# Patient Record
Sex: Male | Born: 1960 | Race: Black or African American | Hispanic: No | Marital: Single | Smoking: Current every day smoker
Health system: Southern US, Community
[De-identification: ages and names within clinical notes are randomized; demographics above are authoritative.]

## PROBLEM LIST (undated history)

## (undated) DIAGNOSIS — I5022 Chronic systolic (congestive) heart failure: Secondary | ICD-10-CM

## (undated) DIAGNOSIS — I10 Essential (primary) hypertension: Secondary | ICD-10-CM

## (undated) DIAGNOSIS — I319 Disease of pericardium, unspecified: Secondary | ICD-10-CM

## (undated) DIAGNOSIS — J189 Pneumonia, unspecified organism: Secondary | ICD-10-CM

## (undated) DIAGNOSIS — I2510 Atherosclerotic heart disease of native coronary artery without angina pectoris: Secondary | ICD-10-CM

## (undated) DIAGNOSIS — J449 Chronic obstructive pulmonary disease, unspecified: Secondary | ICD-10-CM

## (undated) DIAGNOSIS — E0590 Thyrotoxicosis, unspecified without thyrotoxic crisis or storm: Secondary | ICD-10-CM

## (undated) DIAGNOSIS — Z8639 Personal history of other endocrine, nutritional and metabolic disease: Secondary | ICD-10-CM

## (undated) DIAGNOSIS — F1011 Alcohol abuse, in remission: Secondary | ICD-10-CM

## (undated) DIAGNOSIS — Z720 Tobacco use: Secondary | ICD-10-CM

## (undated) DIAGNOSIS — K219 Gastro-esophageal reflux disease without esophagitis: Secondary | ICD-10-CM

## (undated) HISTORY — DX: Disease of pericardium, unspecified: I31.9

## (undated) HISTORY — DX: Pneumonia, unspecified organism: J18.9

## (undated) HISTORY — PX: TONSILLECTOMY: SUR1361

## (undated) HISTORY — DX: Atherosclerotic heart disease of native coronary artery without angina pectoris: I25.10

## (undated) HISTORY — DX: Thyrotoxicosis, unspecified without thyrotoxic crisis or storm: E05.90

---
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
## 2009-10-29 ENCOUNTER — Observation Stay (HOSPITAL_COMMUNITY): Admission: EM | Admit: 2009-10-29 | Discharge: 2009-10-29 | Payer: Self-pay | Admitting: Emergency Medicine

## 2009-11-03 ENCOUNTER — Inpatient Hospital Stay (HOSPITAL_COMMUNITY): Admission: EM | Admit: 2009-11-03 | Discharge: 2009-11-05 | Payer: Self-pay | Admitting: Emergency Medicine

## 2009-11-15 ENCOUNTER — Ambulatory Visit: Payer: Self-pay | Admitting: Physician Assistant

## 2009-11-15 DIAGNOSIS — F1011 Alcohol abuse, in remission: Secondary | ICD-10-CM | POA: Insufficient documentation

## 2009-11-15 DIAGNOSIS — J449 Chronic obstructive pulmonary disease, unspecified: Secondary | ICD-10-CM

## 2009-11-15 DIAGNOSIS — J4489 Other specified chronic obstructive pulmonary disease: Secondary | ICD-10-CM | POA: Insufficient documentation

## 2009-11-15 DIAGNOSIS — I10 Essential (primary) hypertension: Secondary | ICD-10-CM | POA: Insufficient documentation

## 2009-11-15 DIAGNOSIS — R82998 Other abnormal findings in urine: Secondary | ICD-10-CM

## 2009-11-15 LAB — CONVERTED CEMR LAB
AST: 15 units/L (ref 0–37)
Alkaline Phosphatase: 87 units/L (ref 39–117)
Amphetamine Screen, Ur: NEGATIVE
BUN: 18 mg/dL (ref 6–23)
Barbiturate Quant, Ur: NEGATIVE
Basophils Absolute: 0.1 10*3/uL (ref 0.0–0.1)
Basophils Relative: 1 % (ref 0–1)
Benzodiazepines.: NEGATIVE
Bilirubin Urine: NEGATIVE
Blood in Urine, dipstick: NEGATIVE
Cocaine Metabolites: NEGATIVE
Creatinine, Ser: 0.98 mg/dL (ref 0.40–1.50)
Creatinine,U: 224.6 mg/dL
Eosinophils Absolute: 0.3 10*3/uL (ref 0.0–0.7)
Eosinophils Relative: 3 % (ref 0–5)
Glucose, Bld: 87 mg/dL (ref 70–99)
HCT: 40.9 % (ref 39.0–52.0)
Hemoglobin: 13.3 g/dL (ref 13.0–17.0)
Ketones, urine, test strip: NEGATIVE
MCHC: 32.5 g/dL (ref 30.0–36.0)
Methadone: NEGATIVE
Neutro Abs: 4.8 10*3/uL (ref 1.7–7.7)
Nitrite: NEGATIVE
Opiate Screen, Urine: NEGATIVE
Platelets: 190 10*3/uL (ref 150–400)
RDW: 15.5 % (ref 11.5–15.5)
Urobilinogen, UA: 0.2
pH: 5.5

## 2009-11-18 ENCOUNTER — Encounter: Payer: Self-pay | Admitting: Physician Assistant

## 2009-11-18 ENCOUNTER — Inpatient Hospital Stay (HOSPITAL_COMMUNITY): Admission: EM | Admit: 2009-11-18 | Discharge: 2009-11-21 | Payer: Self-pay | Admitting: Emergency Medicine

## 2009-11-19 ENCOUNTER — Ambulatory Visit: Payer: Self-pay | Admitting: Pulmonary Disease

## 2009-11-26 ENCOUNTER — Telehealth (INDEPENDENT_AMBULATORY_CARE_PROVIDER_SITE_OTHER): Payer: Self-pay | Admitting: *Deleted

## 2009-12-04 ENCOUNTER — Ambulatory Visit: Payer: Self-pay | Admitting: Pulmonary Disease

## 2009-12-04 DIAGNOSIS — R930 Abnormal findings on diagnostic imaging of skull and head, not elsewhere classified: Secondary | ICD-10-CM | POA: Insufficient documentation

## 2009-12-17 ENCOUNTER — Encounter: Payer: Self-pay | Admitting: Physician Assistant

## 2009-12-23 ENCOUNTER — Encounter: Payer: Self-pay | Admitting: Physician Assistant

## 2010-11-18 NOTE — Assessment & Plan Note (Signed)
Summary: hfu/ mbw   Primary Provider/Referring Provider:  [facility]  CC:  HFU for pneumonia. The patient c/o increased sob with any exertion and cough with greenish mucus x3-5 days.  The patient has not had any ASA or Spiriva since he got out of the hospital due to cost..  History of Present Illness: 50 yo male hospital follow up for COPD/asthma and abnormal chest xray.  He was doing well after hospital discharge until about 3 days ago.  He says everyone at his home is sick, and he thinks he got a cold.  He has been getting more short of breath.  He has more cough with green sputum.  He is not coughing blood.  He denies fever, wheezing.  His sinuses are okay, and he is not having abd. symptoms.  He has not been on spiriva since hospital discharge as he could not afford this.  He felt much better when on spiriva.  He is not smoking cigarettes.  Preventive Screening-Counseling & Management  Alcohol-Tobacco     Smoking Status: current     Pack years: 38 years x1 ppd  Current Medications (verified): 1)  Norvasc 10 Mg Tabs (Amlodipine Besylate) .... Take 1 Tablet By Mouth Once A Day For Blood Pressure 2)  Accuneb 0.63 Mg/72ml Nebu (Albuterol Sulfate) .... As Directed With Nebulizer 3)  Proventil Hfa 108 (90 Base) Mcg/act Aers (Albuterol Sulfate) .Marland Kitchen.. 1-2 Puffs Every 4-6 Hours As Needed For Shortness of Breath or Wheezing 4)  Spiriva Handihaler 18 Mcg Caps (Tiotropium Bromide Monohydrate) .Marland Kitchen.. 1 Puff Daily 5)  Bayer Childrens Aspirin 81 Mg Chew (Aspirin) .... Take 1 Tablet By Mouth Once A Day  Allergies (verified): No Known Drug Allergies  Past History:  Past Medical History: Hypertension COPD/asthma Myocardial infarction, hx of   a.  per patient in 2005 in [place]   b.  no cath done; may have had a stress test ETOH  Social History: Smoking Status:  current Pack years:  38 years x1 ppd  Vital Signs:  Patient profile:   50 year old male Height:      73 inches (185.42  cm) Weight:      263 pounds (119.55 kg) BMI:     34.82 O2 Sat:      98 % on Room air Temp:     97.9 degrees F (36.61 degrees C) oral Pulse rate:   108 / minute BP sitting:   160 / 92  (left arm) Cuff size:   large  Vitals Entered By: Michel Bickers CMA (December 04, 2009 3:00 PM)  O2 Sat at Rest %:  98 O2 Flow:  Room air CC: HFU for pneumonia. The patient c/o increased sob with any exertion and cough with greenish mucus x3-5 days.  The patient has not had any ASA or Spiriva since he got out of the hospital due to cost.   Physical Exam  General:  normal appearance and healthy appearing.   Nose:  no deformity, discharge, inflammation, or lesions Mouth:  no deformity or lesions Neck:  no JVD.   Lungs:  diminished breath sounds, no wheeze or rales Heart:  regular rhythm, normal rate, and no murmurs.   Extremities:  no edema Cervical Nodes:  no significant adenopathy   Impression & Recommendations:  Problem # 1:  COPD (ICD-496) He has mild exacerbation with a bronchitis.  I don't think he needs prednisone.  Will give him a course of doxycycline.  Have given him samples of spiriva.  Problem # 2:  ABNORMAL CHEST XRAY (ICD-793.1) He had pneumonia in Jan. 2011, and had persistent radiographic findings.  Will repeat his chest xray at next follow up.  Medications Added to Medication List This Visit: 1)  Doxycycline Hyclate 100 Mg Caps (Doxycycline hyclate) .... One by mouth two times a day  Complete Medication List: 1)  Norvasc 10 Mg Tabs (Amlodipine besylate) .... Take 1 tablet by mouth once a day for blood pressure 2)  Accuneb 0.63 Mg/3ml Nebu (Albuterol sulfate) .... As directed with nebulizer 3)  Proventil Hfa 108 (90 Base) Mcg/act Aers (Albuterol sulfate) .Marland Kitchen.. 1-2 puffs every 4-6 hours as needed for shortness of breath or wheezing 4)  Spiriva Handihaler 18 Mcg Caps (Tiotropium bromide monohydrate) .Marland Kitchen.. 1 puff daily 5)  Bayer Childrens Aspirin 81 Mg Chew (Aspirin) .... Take 1 tablet  by mouth once a day 6)  Doxycycline Hyclate 100 Mg Caps (Doxycycline hyclate) .... One by mouth two times a day  Other Orders: Est. Patient Level I (81191)  Patient Instructions: 1)  Spiriva one puff once daily  2)  Doxycycline (antibiotic) one pill two times a day for 7 days 3)  Follow up in 6 to 8 weeks with chest xray Prescriptions: DOXYCYCLINE HYCLATE 100 MG CAPS (DOXYCYCLINE HYCLATE) one by mouth two times a day  #14 x 0   Entered and Authorized by:   Coralyn Helling MD   Signed by:   Coralyn Helling MD on 12/04/2009   Method used:   Print then Give to Patient   RxID:   [id number]

## 2010-11-18 NOTE — Letter (Signed)
Summary: *HSN Results Follow up  [facility]  [address] [place], [place] [address]   Phone: [phone]  Fax: [phone]      11/18/2009   Alejandro Reyes [address] [place], [place]  [address]   Dear  [name],                            ____S.Drinkard,FNP   ____D. Gore,FNP       ____B. McPherson,MD   ____V. Rankins,MD    ____E. Mulberry,MD    ____N. Daphine Deutscher, FNP  ____D. Reche Dixon, MD    ____K. Philipp Deputy, MD    __x__S.Alben Spittle, PA-C     This letter is to inform you that your recent test(s):  _______Pap Smear    ___x____Lab Test     _______X-ray    ___x____ is within acceptable limits  _______ requires a medication change  _______ requires a follow-up lab visit  _______ requires a follow-up visit with your provider   Comments:       _________________________________________________________ If you have any questions, please contact our office                     Sincerely,  Alejandro Newcomer PA-C [facility]
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
## 2010-11-18 NOTE — Letter (Signed)
Summary: REQUESTING RECORDS FROM [facility]   REQUESTING RECORDS FROM [facility]   Imported By: Arta Bruce 12/27/2009 11:22:45  _____________________________________________________________________  External Attachment:    Type:   Image     Comment:   External Document

## 2010-11-18 NOTE — Letter (Signed)
Summary: PT INFORMATION SHEET  PT INFORMATION SHEET   Imported By: Arta Bruce 12/09/2009 12:01:23  _____________________________________________________________________  External Attachment:    Type:   Image     Comment:   External Document

## 2010-11-18 NOTE — Letter (Signed)
Summary: RECEIVED RECORDS FROM [facility]  RECEIV[facility] RECORDS FROM [facility]   Imported By: Arta Bruce 02/14/2010 15:54:09  _____________________________________________________________________  External Attachment:    Type:   Image     Comment:   External Document

## 2010-11-18 NOTE — Progress Notes (Signed)
Summary: appt  ---- Converted from flag ---- ---- 11/21/2009 8:21 AM, Michel Bickers CMA wrote: Marylene Land,  Can you help with setting up a HFU with Dr. Craige Cotta for this patient? Thanks! Lori  ---- 11/20/2009 10:52 AM, Coralyn Helling MD wrote: Need to schedule Mr. Hogston for hospital follow up with chest xray in 2 weeks.  He is still in hospital today, but will probably be d/c home tomorrow.  Can call to schedule appt later this week.  Thanks. ------------------------------  Phone Note Call from Patient   Caller: Patient Call For: sood Summary of Call: lmom for pt to call for appt. Initial call taken by: Eugene Gavia,  November 26, 2009 9:55 AM  Follow-up for Phone Call        pt coming in on 12/04/2009/apc Follow-up by: Eugene Gavia,  November 29, 2009 10:54 AM

## 2010-11-18 NOTE — Assessment & Plan Note (Signed)
Summary: XFU,HTN,ASTHMA//DS   Vital Signs:  Patient profile:   50 year old male Height:      70 inches Weight:      253 pounds BMI:     36.43 O2 Sat:      95 % on Room air Temp:     97.0 degrees F oral Pulse rate:   85 / minute Pulse rhythm:   regular Resp:     18 per minute BP sitting:   160 / 88  (left arm) Cuff size:   large  Vitals Entered By: [place] Shannon (November 15, 2009 10:49 AM)  O2 Flow:  Room air  Serial Vital Signs/Assessments:  Time      Position  BP       Pulse  Resp  Temp     By 12:17 PM            140/86                         Tereso Newcomer PA-C  CC: xf/uy...., Hypertension Management Is Patient Diabetic? No Pain Assessment Patient in pain? no       Does patient need assistance? Functional Status Self care Ambulation Normal Comments PF.    1.   230     2.   270    3.   290         CC:  xf/uy.... and Hypertension Management.  History of Present Illness: New patient. Just d/c after admxn for COPD exac. + pneumonia.  He was d/c 11/05/2009.  He has completed his course of antibx's.  He feels much better.  No fevers.  Cough is almost resolved.  Not bringing up any sputum.   Says he had an MI in 2005.  Details unclear.  No records.  Says he refused a cath and may have had a stress test.  Not taking ASA. Dx with COPD in past and using inhalers.  Not sure if he had PFTs. Taking all meds.  Has finished antibxs and prednisone.  Hypertension History:      He complains of dyspnea with exertion, but denies headache, chest pain, and syncope.  He notes no problems with any antihypertensive medication side effects.        Positive major cardiovascular risk factors include male age 53 years old or older and hypertension.        Positive history for target organ damage include ASHD (either angina/prior MI/prior CABG).     Allergies (verified): No Known Drug Allergies  Comments:  Nurse/Medical Assistant: pt did not bring meds... Armenia Shannon (November 15, 2009 10:52 AM)  Past History:  Past Medical History: Hypertension COPD Myocardial infarction, hx of   a.  per patient in 2005 [place]   b.  no cath done; may have had a stress test  Past Surgical History: Tonsillectomy  Family History: Oral cancer - Father Family History Hypertension  Social History: Occupation: With [employer] alcoholic (sober x 7 mos) h/o THC use denies cocaine use, etc. Single 3 kids  Occupation:  employed  Review of Systems  The patient denies fever, chest pain, syncope, prolonged cough, headaches, hemoptysis, melena, hematochezia, and hematuria.    Physical Exam  General:  alert, well-developed, and well-nourished.   Head:  normocephalic and atraumatic.   Eyes:  pupils equal, pupils round, and pupils reactive to light.   Nose:  no external deformity.   Mouth:  pharynx pink and moist,  no erythema, and no exudates.   Neck:  supple.   Lungs:  normal breath sounds, no crackles, and no wheezes.   Heart:  normal rate, regular rhythm, and no murmur.   Neurologic:  alert & oriented X3 and cranial nerves II-XII intact.   Skin:  turgor normal.   Psych:  normally interactive and good eye contact.     Impression & Recommendations:  Problem # 1:  COPD (ICD-496) improved from exacerbation add spiriva  His updated medication list for this problem includes:    Accuneb 0.63 Mg/52ml Nebu (Albuterol sulfate) .Marland Kitchen... As directed with nebulizer    Proventil Hfa 108 (90 Base) Mcg/act Aers (Albuterol sulfate) .Marland Kitchen... 1-2 puffs every 4-6 hours as needed for shortness of breath or wheezing    Spiriva Handihaler 18 Mcg Caps (Tiotropium bromide monohydrate) .Marland Kitchen... 1 puff daily  Problem # 2:  MYOCARDIAL INFARCTION, HX OF (ICD-412) take ASA once daily get records  His updated medication list for this problem includes:    Norvasc 10 Mg Tabs (Amlodipine besylate) .Marland Kitchen... Take 1 tablet by mouth once a day for blood pressure    Bayer Childrens Aspirin 81 Mg  Chew (Aspirin) .Marland Kitchen... Take 1 tablet by mouth once a day  Problem # 3:  HYPERTENSION (ICD-401.9) repeat bp better just started norvasc cont same meds for now  His updated medication list for this problem includes:    Norvasc 10 Mg Tabs (Amlodipine besylate) .Marland Kitchen... 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 Take 1 tablet by mouth once a day for blood pressure 2)  Accuneb 0.63 Mg/41ml Nebu (Albuterol sulfate) .... As directed with nebulizer 3)  Proventil Hfa 108 (90 Base) Mcg/act Aers (Albuterol sulfate) .Marland Kitchen.. 1-2 puffs every 4-6 hours as needed for shortness of breath or wheezing 4)  Spiriva Handihaler 18 Mcg Caps (Tiotropium bromide monohydrate) .Marland Kitchen.. 1 puff daily 5)  Bayer Childrens Aspirin 81 Mg Chew (Aspirin) .... Take 1 tablet by mouth once a day  Hypertension Assessment/Plan:      The patient's hypertensive risk group is category C: Target organ damage and/or diabetes.  Today's blood pressure is 160/88.    Patient Instructions: 1)  Get old records from 2005 in [place], [place] regarding admission for heart attack/chest pain. 2)  Please schedule a follow-up appointment in 6 weeks with Xylina Rhoads for BP and COPD.  Prescriptions: BAYER CHILDRENS ASPIRIN 81 MG CHEW (ASPIRIN) Take 1 tablet by mouth once a day  #30 x 11   Entered and Authorized by:    Tereso Newcomer PA-C   Signed by:   Tereso Newcomer PA-C on 11/15/2009   Method used:   Print then Give to Patient   RxID:   [id number] SPIRIVA HANDIHALER 18 MCG CAPS (TIOTROPIUM BROMIDE MONOHYDRATE) 1 puff daily  #1 x 5   Entered and Authorized by:   Tereso Newcomer PA-C   Signed by:   Tereso Newcomer PA-C on 11/15/2009   Method used:   Print  then Give to Patient   RxID:   [id number] PROVENTIL HFA 108 (90 BASE) MCG/ACT AERS (ALBUTEROL SULFATE) 1-2 puffs every 4-6 hours as needed for shortness of breath or wheezing  #1 x 5   Entered and Authorized by:   Tereso Newcomer PA-C   Signed by:   Tereso Newcomer PA-C on 11/15/2009   Method used:   Print then Give to Patient   RxID:   [id number] NORVASC 10 MG TABS (AMLODIPINE BESYLATE) Take 1 tablet by mouth once a day for blood pressure  #30 x 5   Entered and Authorized by:   Tereso Newcomer PA-C   Signed by:   Tereso Newcomer PA-C on 11/15/2009   Method used:   Print then Give to Patient   RxID:   [id number]   Laboratory Results   Urine Tests    Routine Urinalysis   Glucose: negative   (Normal Range: Negative) Bilirubin: negative   (Normal Range: Negative) Ketone: negative   (Normal Range: Negative) Spec. Gravity: >=1.030   (Normal Range: 1.003-1.035) Blood: negative   (Normal Range: Negative) pH: 5.5   (Normal Range: 5.0-8.0) Protein: trace   (Normal Range: Negative) Urobilinogen: 0.2   (Normal Range: 0-1) Nitrite: negative   (Normal Range: Negative) Leukocyte Esterace: negative   (Normal Range: Negative)

## 2011-01-04 LAB — BASIC METABOLIC PANEL
Calcium: 9 mg/dL (ref 8.4–10.5)
Creatinine, Ser: 0.92 mg/dL (ref 0.4–1.5)
GFR calc Af Amer: >60 mL/min (ref >60)
Sodium: 136 mEq/L (ref 135–145)

## 2011-01-04 LAB — DIFFERENTIAL
Eosinophils Absolute: 0.3 10*3/uL (ref 0.0–0.7)
Eosinophils Relative: 5 % (ref 0–5)
Lymphocytes Relative: 32 % (ref 12–46)
Lymphs Abs: 2 10*3/uL (ref 0.7–4.0)
Monocytes Relative: 12 % (ref 3–12)
Neutrophils Relative %: 51 % (ref 43–77)

## 2011-01-04 LAB — CBC
Hemoglobin: 12.6 g/dL — ABNORMAL LOW (ref 13.0–17.0)
MCHC: 34.3 g/dL (ref 30.0–36.0)
MCV: 83.9 fL (ref 78.0–100.0)
RBC: 4.38 MIL/uL (ref 4.22–5.81)

## 2011-01-05 LAB — EXPECTORATED SPUTUM ASSESSMENT W GRAM STAIN, RFLX TO RESP C

## 2011-01-05 LAB — DIFFERENTIAL
Basophils Absolute: 0.1 10*3/uL (ref 0.0–0.1)
Eosinophils Relative: 8 % — ABNORMAL HIGH (ref 0–5)
Lymphocytes Relative: 34 % (ref 12–46)
Monocytes Absolute: 0.6 10*3/uL (ref 0.1–1.0)
Neutrophils Relative %: 50 % (ref 43–77)

## 2011-01-05 LAB — POCT I-STAT, CHEM 8
BUN: 10 mg/dL (ref 6–23)
BUN: 13 mg/dL (ref 6–23)
Calcium, Ion: 1.21 mmol/L (ref 1.12–1.32)
Chloride: 106 mEq/L (ref 96–112)
Creatinine, Ser: 0.9 mg/dL (ref 0.4–1.5)
Creatinine, Ser: 1.2 mg/dL (ref 0.4–1.5)
Glucose, Bld: 116 mg/dL — ABNORMAL HIGH (ref 70–99)
Glucose, Bld: 99 mg/dL (ref 70–99)
Hemoglobin: 13.3 g/dL (ref 13.0–17.0)
Potassium: 3.5 mEq/L (ref 3.5–5.1)
Sodium: 142 mEq/L (ref 135–145)

## 2011-01-05 LAB — CULTURE, BLOOD (ROUTINE X 2)

## 2011-01-05 LAB — RAPID URINE DRUG SCREEN, HOSP PERFORMED
Amphetamines: NOT DETECTED
Barbiturates: NOT DETECTED
Opiates: NOT DETECTED
Tetrahydrocannabinol: NOT DETECTED

## 2011-01-05 LAB — CBC
Platelets: 192 10*3/uL (ref 150–400)
RDW: 14.6 % (ref 11.5–15.5)
WBC: 8.7 10*3/uL (ref 4.0–10.5)

## 2011-01-08 LAB — DIFFERENTIAL
Eosinophils Absolute: 0 10*3/uL (ref 0.0–0.7)
Lymphocytes Relative: 8 % — ABNORMAL LOW (ref 12–46)
Lymphs Abs: 1 10*3/uL (ref 0.7–4.0)
Monocytes Relative: 2 % — ABNORMAL LOW (ref 3–12)
Neutrophils Relative %: 90 % — ABNORMAL HIGH (ref 43–77)

## 2011-01-08 LAB — CBC
HCT: 37 % — ABNORMAL LOW (ref 39.0–52.0)
Hemoglobin: 12.3 g/dL — ABNORMAL LOW (ref 13.0–17.0)
MCHC: 32.9 g/dL (ref 30.0–36.0)
MCHC: 33.1 g/dL (ref 30.0–36.0)
MCV: 85.7 fL (ref 78.0–100.0)
RBC: 4.32 MIL/uL (ref 4.22–5.81)
RDW: 14.5 % (ref 11.5–15.5)

## 2011-01-08 LAB — BASIC METABOLIC PANEL
BUN: 9 mg/dL (ref 6–23)
Creatinine, Ser: 0.9 mg/dL (ref 0.4–1.5)
GFR calc Af Amer: >60 mL/min (ref >60)
Glucose, Bld: 137 mg/dL — ABNORMAL HIGH (ref 70–99)
Potassium: 4.3 mEq/L (ref 3.5–5.1)

## 2011-01-08 LAB — COMPREHENSIVE METABOLIC PANEL
ALT: 30 U/L (ref 0–53)
AST: 21 U/L (ref 0–37)
Alkaline Phosphatase: 82 U/L (ref 39–117)
BUN: 6 mg/dL (ref 6–23)
Calcium: 8.9 mg/dL (ref 8.4–10.5)
Chloride: 107 mEq/L (ref 96–112)
GFR calc Af Amer: >60 mL/min (ref >60)
GFR calc non Af Amer: >60 mL/min (ref >60)
Glucose, Bld: 165 mg/dL — ABNORMAL HIGH (ref 70–99)
Potassium: 4.1 mEq/L (ref 3.5–5.1)
Sodium: 138 mEq/L (ref 135–145)
Total Bilirubin: 0.5 mg/dL (ref 0.3–1.2)

## 2012-08-04 ENCOUNTER — Other Ambulatory Visit: Payer: Self-pay

## 2012-08-04 ENCOUNTER — Emergency Department (HOSPITAL_COMMUNITY)
Admission: EM | Admit: 2012-08-04 | Discharge: 2012-08-04 | Disposition: A | Discharge status: Home / Self Care | Payer: 59 | Attending: Emergency Medicine | Admitting: Emergency Medicine

## 2012-08-04 ENCOUNTER — Encounter (HOSPITAL_COMMUNITY): Payer: Self-pay | Admitting: Family Medicine

## 2012-08-04 ENCOUNTER — Emergency Department (HOSPITAL_COMMUNITY): Payer: 59

## 2012-08-04 DIAGNOSIS — R079 Chest pain, unspecified: Secondary | ICD-10-CM | POA: Insufficient documentation

## 2012-08-04 DIAGNOSIS — R0602 Shortness of breath: Secondary | ICD-10-CM | POA: Insufficient documentation

## 2012-08-04 LAB — COMPREHENSIVE METABOLIC PANEL
ALT: 49 U/L (ref 0–53)
BUN: 12 mg/dL (ref 6–23)
CO2: 26 mEq/L (ref 19–32)
Calcium: 9.6 mg/dL (ref 8.4–10.5)
Creatinine, Ser: 0.82 mg/dL (ref 0.50–1.35)
GFR calc Af Amer: >90 mL/min (ref >90)
GFR calc non Af Amer: >90 mL/min (ref >90)
Glucose, Bld: 109 mg/dL — ABNORMAL HIGH (ref 70–99)
Total Protein: 7 g/dL (ref 6.0–8.3)

## 2012-08-04 LAB — CBC WITH DIFFERENTIAL/PLATELET
Eosinophils Absolute: 0.1 10*3/uL (ref 0.0–0.7)
Eosinophils Relative: 2 % (ref 0–5)
HCT: 39.8 % (ref 39.0–52.0)
Hemoglobin: 13.7 g/dL (ref 13.0–17.0)
Lymphocytes Relative: 48 % — ABNORMAL HIGH (ref 12–46)
Lymphs Abs: 2.5 10*3/uL (ref 0.7–4.0)
MCH: 29.4 pg (ref 26.0–34.0)
MCV: 85.4 fL (ref 78.0–100.0)
Monocytes Absolute: 0.8 10*3/uL (ref 0.1–1.0)
Monocytes Relative: 16 % — ABNORMAL HIGH (ref 3–12)
Platelets: 159 10*3/uL (ref 150–400)
RBC: 4.66 MIL/uL (ref 4.22–5.81)
WBC: 5.1 10*3/uL (ref 4.0–10.5)

## 2012-08-04 LAB — POCT I-STAT TROPONIN I

## 2012-08-04 LAB — TROPONIN I: Troponin I: <0.3 ng/mL (ref <0.30)

## 2012-08-04 MED ORDER — OXYCODONE-ACETAMINOPHEN 5-325 MG PO TABS: 1.0000 | ORAL_TABLET | Freq: Four times a day (QID) | ORAL | Status: DC | PRN

## 2012-08-04 MED ORDER — NITROGLYCERIN 0.4 MG SL SUBL: 0.4000 mg | SUBLINGUAL_TABLET | Freq: Once | SUBLINGUAL | Status: DC

## 2012-08-04 MED ORDER — MORPHINE SULFATE 4 MG/ML IJ SOLN
4.0000 mg | Freq: Once | INTRAMUSCULAR | Status: AC
  Administered 2012-08-04: 4 mg via INTRAVENOUS
  Filled 2012-08-04: qty 1

## 2012-08-04 NOTE — ED Provider Notes (Signed)
History     CSN: [id number]  Arrival date & time 08/04/12  1639   First MD Initiated Contact with Patient 08/04/12 1656      Chief Complaint  Patient presents with  . Chest Pain    (Consider location/radiation/quality/duration/timing/severity/associated sxs/prior treatment) Patient is a 51 y.o. male presenting with chest pain. The history is provided by the patient.  Chest Pain The chest pain began 3 - 5 days ago. Primary symptoms include shortness of breath. Pertinent negatives for primary symptoms include no abdominal pain, no nausea and no vomiting.  Pertinent negatives for associated symptoms include no numbness and no weakness.    patient's had a chest pressure for the last 3 days. He has had some shortness of breath with it. No cough. No fever. Pain is worse when he lies down her legs on his left side. No trauma. No recent travel. The pain is described as pressure has been constant for the last 3 days.  History reviewed. No pertinent past medical history.  History reviewed. No pertinent past surgical history.  History reviewed. No pertinent family history.  History  Substance Use Topics  . Smoking status: Current Every Day Smoker  . Smokeless tobacco: Not on file  . Alcohol Use:       Review of Systems  Constitutional: Negative for activity change and appetite change.  HENT: Negative for neck stiffness.   Eyes: Negative for pain.  Respiratory: Positive for shortness of breath. Negative for chest tightness.   Cardiovascular: Positive for chest pain. Negative for leg swelling.  Gastrointestinal: Negative for nausea, vomiting, abdominal pain and diarrhea.  Genitourinary: Negative for flank pain.  Musculoskeletal: Negative for back pain.  Skin: Negative for rash.  Neurological: Negative for weakness, numbness and headaches.  Psychiatric/Behavioral: Negative for behavioral problems.    Allergies  Review of patient's allergies indicates no known allergies.  Home  Medications   Current Outpatient Rx  Name Route Sig Dispense Refill  . OXYCODONE-ACETAMINOPHEN 5-325 MG PO TABS Oral Take 1-2 tablets by mouth every 6 (six) hours as needed for pain. 10 tablet 0    BP 170/70  Pulse 103  Temp 98.5 F (36.9 C) (Oral)  Resp 17  SpO2 97%  Physical Exam  Nursing note and vitals reviewed. Constitutional: He is oriented to person, place, and time. He appears well-developed and well-nourished.  HENT:  Head: Normocephalic and atraumatic.  Eyes: EOM are normal. Pupils are equal, round, and reactive to light.  Neck: Normal range of motion. Neck supple.  Cardiovascular: Normal rate, regular rhythm and normal heart sounds.   No murmur heard. Pulmonary/Chest: Effort normal and breath sounds normal. He exhibits tenderness.       Tenderness over sternum. No crepitance or deformity.  Abdominal: Soft. Bowel sounds are normal. He exhibits no distension and no mass. There is no tenderness. There is no rebound and no guarding.  Musculoskeletal: Normal range of motion. He exhibits no edema.  Neurological: He is alert and oriented to person, place, and time. No cranial nerve deficit.  Skin: Skin is warm and dry.  Psychiatric: He has a normal mood and affect.    ED Course  Procedures (including critical care time)  Labs Reviewed  CBC WITH DIFFERENTIAL - Abnormal; Notable for the following:    Neutrophils Relative 34 (*)     Lymphocytes Relative 48 (*)     Monocytes Relative 16 (*)     All other components within normal limits  COMPREHENSIVE METABOLIC PANEL - Abnormal; Notable  for the following:    Glucose, Bld 109 (*)     Albumin 3.4 (*)     Alkaline Phosphatase 122 (*)     All other components within normal limits  POCT I-STAT TROPONIN I - Abnormal; Notable for the following:    Troponin i, poc 0.17 (*)     All other components within normal limits  TROPONIN I  D-DIMER, QUANTITATIVE  TROPONIN I   Dg Chest 2 View  08/04/2012  *RADIOLOGY REPORT*   Clinical Data: Chest pain.  CHEST - 2 VIEW  Comparison: 11/18/2009  Findings: Previously seen opacity in the central right upper lobe is no longer visualized.  No evidence of acute infiltrate or edema. No evidence of pleural effusion.  Heart size is normal.  There is no hilar or mediastinal lymphadenopathy identified.  IMPRESSION: No active disease.   Original Report Authenticated By: Danae Orleans, M.D.      1. Chest pain      Date: 08/04/2012  Rate: 108  Rhythm: sinus tachycardia  QRS Axis: normal  Intervals: normal  ST/T Wave abnormalities: early repolarization  Conduction Disutrbances:LVH  Narrative Interpretation:   Old EKG Reviewed: unchanged    MDM  Patient with chest pain. Reproducible and worse with lying down. Stable EKG and negative enzymes x2. Negative d-dimer. Initial point her troponin was elevated, but is below the normal range of the other troponins.        Juliet Rude. Rubin Payor, MD 08/04/12 2156

## 2012-08-04 NOTE — ED Notes (Signed)
Per pt sts 3 days of chest pain associated with SOB. Denies N,V,. Denies cough, fever.sts pain is worse when lying down and he is unable to sleep.

## 2012-08-12 ENCOUNTER — Encounter (HOSPITAL_COMMUNITY): Payer: Self-pay | Admitting: Adult Health

## 2012-08-12 ENCOUNTER — Emergency Department (HOSPITAL_COMMUNITY): Payer: 59

## 2012-08-12 ENCOUNTER — Observation Stay (HOSPITAL_COMMUNITY)
Admission: EM | Admit: 2012-08-12 | Discharge: 2012-08-15 | Disposition: A | Discharge status: Home / Self Care | Payer: 59 | Attending: Internal Medicine | Admitting: Internal Medicine

## 2012-08-12 DIAGNOSIS — J449 Chronic obstructive pulmonary disease, unspecified: Secondary | ICD-10-CM | POA: Insufficient documentation

## 2012-08-12 DIAGNOSIS — F1011 Alcohol abuse, in remission: Secondary | ICD-10-CM | POA: Diagnosis present

## 2012-08-12 DIAGNOSIS — I10 Essential (primary) hypertension: Secondary | ICD-10-CM

## 2012-08-12 DIAGNOSIS — I5022 Chronic systolic (congestive) heart failure: Secondary | ICD-10-CM

## 2012-08-12 DIAGNOSIS — Z23 Encounter for immunization: Secondary | ICD-10-CM | POA: Insufficient documentation

## 2012-08-12 DIAGNOSIS — J4489 Other specified chronic obstructive pulmonary disease: Secondary | ICD-10-CM

## 2012-08-12 DIAGNOSIS — R079 Chest pain, unspecified: Principal | ICD-10-CM

## 2012-08-12 DIAGNOSIS — F17200 Nicotine dependence, unspecified, uncomplicated: Secondary | ICD-10-CM | POA: Insufficient documentation

## 2012-08-12 DIAGNOSIS — Z720 Tobacco use: Secondary | ICD-10-CM

## 2012-08-12 DIAGNOSIS — R0989 Other specified symptoms and signs involving the circulatory and respiratory systems: Secondary | ICD-10-CM | POA: Insufficient documentation

## 2012-08-12 HISTORY — DX: Essential (primary) hypertension: I10

## 2012-08-12 LAB — CBC
HCT: 38.4 % — ABNORMAL LOW (ref 39.0–52.0)
MCH: 29.3 pg (ref 26.0–34.0)
MCV: 84.6 fL (ref 78.0–100.0)
Platelets: 140 10*3/uL — ABNORMAL LOW (ref 150–400)
RDW: 12.6 % (ref 11.5–15.5)
WBC: 4.7 10*3/uL (ref 4.0–10.5)

## 2012-08-12 LAB — BASIC METABOLIC PANEL
BUN: 12 mg/dL (ref 6–23)
CO2: 25 mEq/L (ref 19–32)
Calcium: 9.6 mg/dL (ref 8.4–10.5)
Chloride: 107 mEq/L (ref 96–112)
Creatinine, Ser: 0.78 mg/dL (ref 0.50–1.35)

## 2012-08-12 MED ORDER — GI COCKTAIL ~~LOC~~
30.0000 mL | Freq: Once | ORAL | Status: AC
  Administered 2012-08-12: 30 mL via ORAL
  Filled 2012-08-12: qty 30

## 2012-08-12 NOTE — ED Provider Notes (Signed)
History     CSN: [id number]  Arrival date & time 08/12/12  2035   First MD Initiated Contact with Patient 08/12/12 2056      Chief Complaint  Patient presents with  . Chest Pain    (Consider location/radiation/quality/duration/timing/severity/associated sxs/prior treatment) HPI Two weeks of intermittent chest pain that pt describes as "episodes of tightness." He has a hard time describing it saying "there are no words, it just hurts real bad." Recently seen here in Ed and d/c'd.  Pain described as severe in intensity. The location of the patient's problem is chest.  Onset was two weeks with waxing and waning course since that time.   Modifying factors:  none.  Associated symptoms: no fever, no SOB, no nausea.  Past Medical History  Diagnosis Date  . Hypertension     History reviewed. No pertinent past surgical history.  History reviewed. No pertinent family history.  History  Substance Use Topics  . Smoking status: Current Every Day Smoker    Types: Cigarettes  . Smokeless tobacco: Not on file  . Alcohol Use: No      Review of Systems Negative for respiratory distress, cough. Negative for vomiting, diarrhea.  All other systems reviewed and negative unless noted in HPI.    Allergies  Review of patient's allergies indicates no known allergies.  Home Medications  No current outpatient prescriptions on file.  BP 144/89  Pulse 108  Temp 97.9 F (36.6 C) (Oral)  Resp 18  SpO2 99%  Physical Exam Nursing note and vitals reviewed.  Constitutional: Pt is alert and appears stated age. Oropharynx: Airway open without erythema or exudate. Respiratory: No respiratory distress. Equal breathing bilaterally. CV: Extremities warm and well perfused. Neuro: No motor nor sensory deficit. Head: Normocephalic and atraumatic. Eyes: No conjunctivitis, no scleral icterus. Neck: Supple, no mass. Chest: Non-tender. Abdomen: Soft, non-tender MSK: Extremities are atraumatic  without deformity. Skin: No rash, no wounds.  ED Course  Procedures (including critical care time)  Labs Reviewed  CBC - Abnormal; Notable for the following:    HCT 38.4 (*)     Platelets 140 (*)     All other components within normal limits  BASIC METABOLIC PANEL - Abnormal; Notable for the following:    Glucose, Bld 127 (*)     All other components within normal limits  POCT I-STAT TROPONIN I    CT ANGIO CHEST AORTIC DISSECT W &/OR W/O (Final result)   Result time:08/13/12 4098    Final result by Rad Results In Interface (08/13/12 00:55:11)    Narrative:   *RADIOLOGY REPORT*  Clinical Data: 2 weeks of sternal chest pain shooting to the back and neck.  CT ANGIOGRAPHY CHEST  Technique: Multidetector CT imaging through the chest, abdomen and pelvis was performed using the standard protocol during bolus administration of intravenous contrast. Multiplanar reconstructed images including MIPs were obtained and reviewed to evaluate the vascular anatomy.  Contrast: OMNIPAQUE IOHEXOL 350 MG/ML SOLN  Comparison: 10/29/2009 CT chest.  CTA CHEST  Findings: Unenhanced images demonstrate normal caliber of the thoracic aorta without significant aortic calcification. No significant coronary artery calcification. Incidental note of a 19 mm nodule in the left adrenal gland containing fat density consistent with a benign lipoma.  Contrast enhanced images demonstrate stable appearance of soft tissue in the anterior mediastinum which may represent adenopathy or thymic tissue. Additional mediastinal lymph nodes are not pathologically enlarged. Normal caliber thoracic aorta. No evidence of dissection. Motion artifact in the ascending thoracic aorta.  No pleural effusions. No pneumothorax. There is scarring and infiltration centrally in the right middle lung and right upper lung which is improving since the previous study. Airways appear patent. Peribronchial thickening  consistent with chronic bronchitis. Minimal emphysematous changes in the apices. No acute consolidation. Diffuse prominence of the thyroid gland without focal nodularity.  Normal alignment of the thoracic spine with mild degenerative change.  Review of the MIP images confirms the above findings.  IMPRESSION: No evidence of aortic dissection. Chronic bronchitic changes with focal scarring centrally in the right middle lung and right upper lung, improved since previous study. Soft tissue density in the anterior mediastinum may represent residual thymus or adenopathy. This appears stable since previous study.      1. Chest pain       MDM  51 y.o. male here with chest pain.  Pertinent past problems include HTN, tobacco use. Here with similar presentation one week ago and had 2 neg troponins, neg d-dimer, sent home with plan to f/u with PCP which he never did. Returns now with description of severe shooting chest pain. Currently not with severe pain. Given h/o of HTN, tobacco use, characteristics of severe pain feel pt will need CTA chest to r/o dissection. Will treat with GI cocktail. If w/u is negative, feel pt will need stress test. Pt does not have PCP.   Data reviewed: Record review: ED visit here 08/04/12 for chest pain and sent home after 2 neg troponin, neg d-dimer. EKG ordered and interpreted by me: NSR, no axis deviation, no QRS widening and voltage criteria for LVH. No significant change from prior.  Lab tests ordered and reviewed by me: CBC, BMP unremarkable. Troponin low. I independently viewed the following imaging studies and reviewed radiology's interpretation as summarized: CTA with no evidence of aneurysm or dissection.  Course of care: On re-eval, pt stable, resting comfortably, no complaints. Medicine called and they will admit to tele under obs.   Medical Decision Making discussed with ED attending Celene Kras, MD          Charm Barges, MD 08/13/12 [phone]

## 2012-08-12 NOTE — ED Notes (Signed)
Pt reports his chest pain starts when he lays down flat and he feels a burning in the back of his throat.

## 2012-08-12 NOTE — ED Notes (Signed)
Pt reports a 2 weeks of sternal chest pain that "shoots to back and neck and makes my head pound" denies SOB, denies nausea. Pain is constant and described as  "there is no words, it just hurts real bad"denies dizziness. Pain is not reproducable with touch.  Denies pain with deep inspiration. Pain sometimes subsides with exertion. Rest does not help.

## 2012-08-12 NOTE — ED Notes (Signed)
Patient transported to X-ray 

## 2012-08-12 NOTE — ED Notes (Signed)
Old and new given to Dr Adriana Simas.

## 2012-08-13 ENCOUNTER — Encounter (HOSPITAL_COMMUNITY): Payer: Self-pay | Admitting: Internal Medicine

## 2012-08-13 DIAGNOSIS — Z720 Tobacco use: Secondary | ICD-10-CM | POA: Diagnosis present

## 2012-08-13 DIAGNOSIS — I10 Essential (primary) hypertension: Secondary | ICD-10-CM

## 2012-08-13 DIAGNOSIS — F17200 Nicotine dependence, unspecified, uncomplicated: Secondary | ICD-10-CM

## 2012-08-13 DIAGNOSIS — R079 Chest pain, unspecified: Secondary | ICD-10-CM | POA: Diagnosis present

## 2012-08-13 DIAGNOSIS — R6889 Other general symptoms and signs: Secondary | ICD-10-CM

## 2012-08-13 LAB — CBC
HCT: 39.1 % (ref 39.0–52.0)
MCV: 84.3 fL (ref 78.0–100.0)
RDW: 12.5 % (ref 11.5–15.5)
WBC: 4.7 10*3/uL (ref 4.0–10.5)

## 2012-08-13 LAB — BASIC METABOLIC PANEL
BUN: 11 mg/dL (ref 6–23)
CO2: 23 mEq/L (ref 19–32)
Chloride: 106 mEq/L (ref 96–112)
Creatinine, Ser: 0.76 mg/dL (ref 0.50–1.35)

## 2012-08-13 LAB — TROPONIN I: Troponin I: <0.3 ng/mL (ref <0.30)

## 2012-08-13 MED ORDER — SODIUM CHLORIDE 0.9 % IJ SOLN: 3.0000 mL | INTRAMUSCULAR | Status: DC | PRN

## 2012-08-13 MED ORDER — HYDRALAZINE HCL 20 MG/ML IJ SOLN
10.0000 mg | Freq: Four times a day (QID) | INTRAMUSCULAR | Status: DC | PRN
  Filled 2012-08-13: qty 0.5

## 2012-08-13 MED ORDER — ACETAMINOPHEN 650 MG RE SUPP: 650.0000 mg | Freq: Four times a day (QID) | RECTAL | Status: DC | PRN

## 2012-08-13 MED ORDER — ZOLPIDEM TARTRATE 5 MG PO TABS: 5.0000 mg | ORAL_TABLET | Freq: Every evening | ORAL | Status: DC | PRN

## 2012-08-13 MED ORDER — HYDROMORPHONE HCL PF 1 MG/ML IJ SOLN
0.5000 mg | INTRAMUSCULAR | Status: DC | PRN
  Administered 2012-08-13: 1 mg via INTRAVENOUS
  Filled 2012-08-13: qty 1

## 2012-08-13 MED ORDER — OXYCODONE HCL 5 MG PO TABS
5.0000 mg | ORAL_TABLET | ORAL | Status: DC | PRN
  Administered 2012-08-13: 5 mg via ORAL
  Filled 2012-08-13: qty 1

## 2012-08-13 MED ORDER — SODIUM CHLORIDE 0.9 % IV SOLN: 250.0000 mL | INTRAVENOUS | Status: DC | PRN

## 2012-08-13 MED ORDER — ONDANSETRON HCL 4 MG/2ML IJ SOLN: 4.0000 mg | Freq: Three times a day (TID) | INTRAMUSCULAR | Status: AC | PRN

## 2012-08-13 MED ORDER — ASPIRIN EC 325 MG PO TBEC
325.0000 mg | DELAYED_RELEASE_TABLET | Freq: Every day | ORAL | Status: DC
  Administered 2012-08-13 – 2012-08-15 (×3): 325 mg via ORAL
  Filled 2012-08-13 (×3): qty 1

## 2012-08-13 MED ORDER — PANTOPRAZOLE SODIUM 40 MG PO TBEC
40.0000 mg | DELAYED_RELEASE_TABLET | Freq: Every day | ORAL | Status: DC
  Administered 2012-08-13 – 2012-08-15 (×3): 40 mg via ORAL
  Filled 2012-08-13 (×3): qty 1

## 2012-08-13 MED ORDER — NICOTINE 21 MG/24HR TD PT24
21.0000 mg | MEDICATED_PATCH | Freq: Every day | TRANSDERMAL | Status: DC
  Administered 2012-08-13 – 2012-08-15 (×3): 21 mg via TRANSDERMAL
  Filled 2012-08-13 (×3): qty 1

## 2012-08-13 MED ORDER — SODIUM CHLORIDE 0.9 % IJ SOLN
3.0000 mL | Freq: Two times a day (BID) | INTRAMUSCULAR | Status: DC
  Administered 2012-08-13 – 2012-08-15 (×3): 3 mL via INTRAVENOUS

## 2012-08-13 MED ORDER — ALUM & MAG HYDROXIDE-SIMETH 200-200-20 MG/5ML PO SUSP: 30.0000 mL | Freq: Four times a day (QID) | ORAL | Status: DC | PRN

## 2012-08-13 MED ORDER — ONDANSETRON HCL 4 MG/2ML IJ SOLN: 4.0000 mg | Freq: Four times a day (QID) | INTRAMUSCULAR | Status: DC | PRN

## 2012-08-13 MED ORDER — INFLUENZA VIRUS VACC SPLIT PF IM SUSP
0.5000 mL | INTRAMUSCULAR | Status: AC
  Administered 2012-08-15: 0.5 mL via INTRAMUSCULAR
  Filled 2012-08-13: qty 0.5

## 2012-08-13 MED ORDER — LISINOPRIL 10 MG PO TABS
10.0000 mg | ORAL_TABLET | Freq: Every day | ORAL | Status: DC
  Administered 2012-08-13 – 2012-08-15 (×3): 10 mg via ORAL
  Filled 2012-08-13 (×3): qty 1

## 2012-08-13 MED ORDER — NITROGLYCERIN 2 % TD OINT
1.0000 [in_us] | TOPICAL_OINTMENT | Freq: Four times a day (QID) | TRANSDERMAL | Status: DC
  Administered 2012-08-13 – 2012-08-15 (×7): 1 [in_us] via TOPICAL
  Filled 2012-08-13: qty 30

## 2012-08-13 MED ORDER — IOHEXOL 350 MG/ML SOLN
100.0000 mL | Freq: Once | INTRAVENOUS | Status: AC | PRN
  Administered 2012-08-13: 100 mL via INTRAVENOUS

## 2012-08-13 MED ORDER — ONDANSETRON HCL 4 MG PO TABS: 4.0000 mg | ORAL_TABLET | Freq: Four times a day (QID) | ORAL | Status: DC | PRN

## 2012-08-13 MED ORDER — ACETAMINOPHEN 325 MG PO TABS: 650.0000 mg | ORAL_TABLET | Freq: Four times a day (QID) | ORAL | Status: DC | PRN

## 2012-08-13 MED ORDER — ENOXAPARIN SODIUM 40 MG/0.4ML ~~LOC~~ SOLN
40.0000 mg | SUBCUTANEOUS | Status: DC
  Administered 2012-08-15: 40 mg via SUBCUTANEOUS
  Filled 2012-08-13 (×3): qty 0.4

## 2012-08-13 NOTE — Progress Notes (Signed)
Monitor tech informs the nurse that the patient was  In SVT with a HR of 160. Nurse checks on patient, and patient was up and moving around in the room. Nurse instructed the patient to relax. Patient complied. HR decreased to low 106. Alejandro Reyes

## 2012-08-13 NOTE — Progress Notes (Signed)
Pt c/o of chest tightness. Vital signs taken and were stable. EKG obtained. 1 mg Dilaudid IV given. Pt is now resting comfortably in bed with no pain. Dr. Mayford Knife paged and made aware. Will continue to monitor. Dion Saucier

## 2012-08-13 NOTE — Consult Note (Addendum)
Admit date: 08/12/2012 Referring Physician  Dr. Rito Ehrlich Primary Physician  None Primary Cardiologist  [place] Reason for Consultation  Chest pain  HPI: Alejandro Reyes is a 51 y.o. male who presented to the ED with complaints of intermittent substernal chest pain x 2 weeks. He reports having dull 10/10 pain at times, and at the worse the pain can last up to 2 hours. He denies having any SOB or Nausea or Diaphoresis associated with the pain. He was seen in the ED earlier in the week for the same symptoms. This visit to the ED a CTA of the chest was performed and found to be negative for pulmonary emboli.  We are not asked to consult for further evaluation.  He states that his chest pain starts out not very sever but then builds up to very severe and sharp and is midsternal and radiates through his back and up into his head with a headache.        PMH:   Past Medical History  Diagnosis Date  . Hypertension      PSH:   Past Surgical History  Procedure Date  . Tonsillectomy     Allergies:  Review of patient's allergies indicates no known allergies. Prior to Admit Meds:   No prescriptions prior to admission   Fam HX:    Family History  Problem Relation Age of Onset  . Hypertension Father   . Cancer - Other Father     SCCa of Oropharynx   Social HX:    History   Social History  . Marital Status: Single    Spouse Name: N/A    Number of Children: N/A  . Years of Education: N/A   Occupational History  . Not on file.   Social History Main Topics  . Smoking status: Current Every Day Smoker    Types: Cigarettes  . Smokeless tobacco: Not on file  . Alcohol Use: No  . Drug Use: No  . Sexually Active:    Other Topics Concern  . Not on file   Social History Narrative  . No narrative on file     ROS:  All 11 ROS were addressed and are negative except what is stated in the HPI  Physical Exam: Blood pressure 154/62, pulse 106, temperature 97.9 F (36.6 C), temperature  source Oral, resp. rate 18, height 6\' 2"  (1.88 m), weight 105.3 kg (232 lb 2.3 oz), SpO2 99.00%.    General: Well developed, well nourished, in no acute distress Head: Eyes PERRLA, No xanthomas.   Normal cephalic and atramatic  Lungs:   Clear bilaterally to auscultation and percussion. Heart:   HRRR S1 S2 Pulses are 2+ & equal.            bilateral carotid bruit. No JVD.  No abdominal bruits. No femoral bruits. Abdomen: Bowel sounds are positive, abdomen soft and non-tender without masses Extremities:   No clubbing, cyanosis or edema.  Trace distal pulses Neuro: Alert and oriented X 3. Psych:  Good affect, responds appropriately    Labs:   Lab Results  Component Value Date   WBC 4.7 08/13/2012   HGB 13.5 08/13/2012   HCT 39.1 08/13/2012   MCV 84.3 08/13/2012   PLT 143* 08/13/2012    Lab 08/13/12 0328  NA 138  K 4.1  CL 106  CO2 23  BUN 11  CREATININE 0.76  CALCIUM 9.8  PROT --  BILITOT --  ALKPHOS --  ALT --  AST --  GLUCOSE 98  No results found for this basename: PTT   No results found for this basename: INR, PROTIME   Lab Results  Component Value Date   TROPONINI <0.30 08/13/2012         Radiology:  *RADIOLOGY REPORT*  Clinical Data: Chest pain  CHEST - 2 VIEW  Comparison: 08/04/2012  Findings: Lungs are essentially clear. No pleural effusion or  pneumothorax.  Apparent mild patchy left midlung opacity likely reflects normal  pulmonary markings, as it is unchanged from multiple prior studies  and without correlate on prior CT.  Cardiomediastinal silhouette is within normal limits.  Mild degenerative changes of the visualized thoracolumbar spine.  IMPRESSION:  No evidence of acute cardiopulmonary disease.  Original Report Authenticated By: Charline Bills, M.D.   EKG:  NSR with LVH by voltage criteria  ASSESSMENT:  1.  Chest pain syndrome with negative cardiac enzymes and no ischemia by EKG. 2.  HTN 3.  Tobacco abuse 4.  COPD 5.  History of  ETOH abuse 6.  Bilateral carotid artery bruits 7.  Significant calcification of the abdominal aorta and branches but no significant calcification of the coronary arteries.  PLAN:   1.  NPO after midnight 2.  Stress Cardiolyte in am 3.  2D echo today to assess LVF 4.  Carotid dopplers   Quintella Reichert, MD  08/13/2012  1:10 PM

## 2012-08-13 NOTE — Progress Notes (Signed)
Dr. Mayford Knife returned page concerning pt's chest pain. No new orders received. Will continue to monitor. Dion Saucier

## 2012-08-13 NOTE — H&P (Addendum)
[facility] History and Physical  Walton Pooler ZOX:[id number] DOB: 12-20-1960 DOA: 08/12/2012  Referring physician:  PCP: No primary provider on file.  Specialists:   Chief Complaint:   HPI: Alejandro Reyes is a 51 y.o. male who presents to the ED with complaints of intermittent substernal chest pain x 2 weeks.  He reports having dull 10/10 pain at times, and at the worse the pain can last up to 2 hours.  He denies having any SOB or Nausea or Diaphoresis associated with the pain.  He was seen in the ED earlier in the week for the same symptoms. This visit to the ED a CTA of the chest was performed and found to be negative for pulmonary emboli.   Review of Systems: The patient denies anorexia, fever, weight loss,, vision loss, decreased hearing, hoarseness, chest pain, syncope, dyspnea on exertion, peripheral edema, balance deficits, hemoptysis, abdominal pain, melena, hematochezia, severe indigestion/heartburn, hematuria, incontinence, genital sores, muscle weakness, suspicious skin lesions, transient blindness, difficulty walking, depression, unusual weight change, abnormal bleeding, enlarged lymph nodes, angioedema, and breast masses.    Past Medical History  Diagnosis Date  . Hypertension      Past Surgical History  Procedure Date  . Tonsillectomy      Medications:  HOME MEDS: Prior to Admission medications  None   No Known Allergies   Social History:  reports that he has been smoking Cigarettes.  He does not have any smokeless tobacco history on file. He reports that he does not drink alcohol or use illicit drugs.    Family History  Problem Relation Age of Onset  . Hypertension Father   . Cancer - Other Father     SCCa of Oropharynx    Physical Exam:  GEN:  Pleasant examined  and in no acute distress; cooperative with exam Filed Vitals:   08/12/12 2230 08/12/12 2300 08/12/12 2330 08/13/12 0258  BP: 157/73 145/91 155/79 136/76  Pulse: 97 94 84 102    Temp:      TempSrc:      Resp: 21 20 21 14   SpO2: 98% 98% 97% 98%   Blood pressure 136/76, pulse 102, temperature 97.9 F (36.6 C), temperature source Oral, resp. rate 14, SpO2 98.00%. PSYCH: He is alert and oriented x4; does not appear anxious does not appear depressed; affect is normal HEENT: Normocephalic and Atraumatic, Mucous membranes pink; PERRLA; EOM intact; Fundi:  Benign;  No scleral icterus, Nares: Patent, Oropharynx: Clear, Fair Dentition, Neck:  FROM, no cervical lymphadenopathy nor thyromegaly or carotid bruit; no JVD; Breasts:: Not examined CHEST WALL: No tenderness CHEST: Normal respiration, clear to auscultation bilaterally HEART: Regular rate and rhythm; no murmurs rubs or gallops BACK: No kyphosis or scoliosis; no CVA tenderness ABDOMEN: Positive Bowel Sounds, Obese, soft non-tender; no masses, no organomegaly, no pannus; no intertriginous candida. Rectal Exam: Not done EXTREMITIES: No cyanosis, clubbing or edema; no ulcerations. Genitalia: not examined PULSES: 2+ and symmetric SKIN: Normal hydration no rash or ulceration CNS: Cranial nerves 2-12 grossly intact no focal neurologic deficit  Labs on Admission:  Basic Metabolic Panel:  Lab 08/12/12 8119  NA 140  K 3.7  CL 107  CO2 25  GLUCOSE 127*  BUN 12  CREATININE 0.78  CALCIUM 9.6  MG --  PHOS --   Liver Function Tests: No results found for this basename: AST:5,ALT:5,ALKPHOS:5,BILITOT:5,PROT:5,ALBUMIN:5 in the last 168 hours No results found for this basename: LIPASE:5,AMYLASE:5 in the last 168 hours No results found for this basename: AMMONIA:5 in  the last 168 hours CBC:  Lab 08/12/12 2126  WBC 4.7  NEUTROABS --  HGB 13.3  HCT 38.4*  MCV 84.6  PLT 140*   Cardiac Enzymes: No results found for this basename: CKTOTAL:5,CKMB:5,CKMBINDEX:5,TROPONINI:5 in the last 168 hours  BNP (last 3 results) No results found for this basename: PROBNP:3 in the last 8760 hours CBG: No results found for this  basename: GLUCAP:5 in the last 168 hours  Radiological Exams on Admission: See CTA Chest Result:    EKG: Independently reviewed.   Assessment: Principal Problem:  *Chest pain Active Problems:  HYPERTENSION  Tobacco abuse     Plan:   Admit to Observation to Telemetry Bed for Cardiac Rule Out Cycle Cardiac Enzymes Niropaste, O2 and ASA therapy Start Ace inhibitor Rx,and PRN Hydralazine for HTN.   Tobacco Cessation discussed and ordered Nicotine patch Protonix Schedule Cardiac Stress Test as inpatient DVT prophylaxis   Code Status:  FULL CODE Family Communication:  N/A Disposition Plan: Return to Home  Time spent: 15 Minutes   Ron Parker [facility] Pager [phone]  If 7PM-7AM, please contact night-coverage [email or web address] Password [email or web address] 08/13/2012, 3:36 AM

## 2012-08-13 NOTE — ED Provider Notes (Signed)
I saw and evaluated the patient, reviewed the resident's note and I agree with the findings and plan.  I reviewed and interpreted the EKG during the patient's evaluation in the ED and agree with the resident's interpretation.   Pt with intermittent chest pain.  Symptoms concerning for possible ACS.  Will plan on admission for further evaluation.  Celene Kras, MD 08/13/12 [phone]

## 2012-08-13 NOTE — Progress Notes (Signed)
Utilization review completed. Shantese Raven, RN, BSN. 

## 2012-08-13 NOTE — Progress Notes (Signed)
[facility] PROGRESS NOTE  Alejandro Reyes OZH:[id number] DOB: [birth year] DOA: 08/12/2012 PCP: No primary provider on file.  Assessment/Plan: 1. Chest pain: r/o ACS - admitted to tele - enzymes so far neg - cardiology consult - stress in am - protonix.   Resume home medications.      Consultants: cardiology Antibiotics:none  HPI/Subjective: Chest pain improved.   Objective: Filed Vitals:   08/12/12 2330 08/13/12 0258 08/13/12 0343 08/13/12 1413  BP: 155/79 136/76 154/62 150/66  Pulse: 84 102 106 105  Temp:   97.9 F (36.6 C) 98.4 F (36.9 C)  TempSrc:   Oral   Resp: 21 14 18 18   Height:   6\' 2"  (1.88 m)   Weight:   105.3 kg (232 lb 2.3 oz)   SpO2: 97% 98% 99% 100%    Intake/Output Summary (Last 24 hours) at 08/13/12 1820 Last data filed at 08/13/12 1726  Gross per 24 hour  Intake    720 ml  Output      0 ml  Net    720 ml   Filed Weights   08/13/12 0343  Weight: 105.3 kg (232 lb 2.3 oz)    Exam:   General:  ALERT AFEBRILE COMFORTABLE  Cardiovascular: s1s2 RRR, No MRG  Respiratory: CTAB, no wheezing rhonchi or rales  Abdomen: soft NT ND BS+  Extremities: no pedal edema cyanosis or clubbing.   Data Reviewed: Basic Metabolic Panel:  Lab 08/13/12 6295 08/12/12 2126  NA 138 140  K 4.1 3.7  CL 106 107  CO2 23 25  GLUCOSE 98 127*  BUN 11 12  CREATININE 0.76 0.78  CALCIUM 9.8 9.6  MG -- --  PHOS -- --   Liver Function Tests: No results found for this basename: AST:5,ALT:5,ALKPHOS:5,BILITOT:5,PROT:5,ALBUMIN:5 in the last 168 hours No results found for this basename: LIPASE:5,AMYLASE:5 in the last 168 hours No results found for this basename: AMMONIA:5 in the last 168 hours CBC:  Lab 08/13/12 0328 08/12/12 2126  WBC 4.7 4.7  NEUTROABS -- --  HGB 13.5 13.3  HCT 39.1 38.4*  MCV 84.3 84.6  PLT 143* 140*   Cardiac Enzymes:  Lab 08/13/12 1537 08/13/12 0912 08/13/12 0325  CKTOTAL -- -- --  CKMB -- -- --  CKMBINDEX -- -- --    TROPONINI <0.30 <0.30 <0.30   BNP (last 3 results) No results found for this basename: PROBNP:3 in the last 8760 hours CBG: No results found for this basename: GLUCAP:5 in the last 168 hours  No results found for this or any previous visit (from the past 240 hour(s)).      Scheduled Meds:   . aspirin EC  325 mg Oral Daily  . enoxaparin (LOVENOX) injection  40 mg Subcutaneous Q24H  . gi cocktail  30 mL Oral Once  . influenza  inactive virus vaccine  0.5 mL Intramuscular Tomorrow-1000  . lisinopril  10 mg Oral Daily  . nicotine  21 mg Transdermal Daily  . nitroGLYCERIN  1 inch Topical Q6H  . pantoprazole  40 mg Oral Daily  . sodium chloride  3 mL Intravenous Q12H   Continuous Infusions:   Principal Problem:  *Chest pain Active Problems:  HYPERTENSION  Tobacco abuse        Celesta Funderburk  [facility] Pager [phone]. If 8PM-8AM, please contact night-coverage at [email or web address], password [facility] 08/13/2012, 6:20 PM  LOS: 1 day

## 2012-08-14 ENCOUNTER — Observation Stay (HOSPITAL_COMMUNITY): Payer: 59

## 2012-08-14 DIAGNOSIS — R0989 Other specified symptoms and signs involving the circulatory and respiratory systems: Secondary | ICD-10-CM

## 2012-08-14 DIAGNOSIS — J449 Chronic obstructive pulmonary disease, unspecified: Secondary | ICD-10-CM

## 2012-08-14 DIAGNOSIS — R079 Chest pain, unspecified: Secondary | ICD-10-CM

## 2012-08-14 MED ORDER — REGADENOSON 0.4 MG/5ML IV SOLN
0.4000 mg | Freq: Once | INTRAVENOUS | Status: DC
  Filled 2012-08-14: qty 5

## 2012-08-14 MED ORDER — TECHNETIUM TC 99M SESTAMIBI GENERIC - CARDIOLITE
30.0000 | Freq: Once | INTRAVENOUS | Status: AC | PRN
  Administered 2012-08-14: 30 via INTRAVENOUS

## 2012-08-14 MED ORDER — REGADENOSON 0.4 MG/5ML IV SOLN
INTRAVENOUS | Status: AC
  Administered 2012-08-14: 0.4 mg
  Filled 2012-08-14: qty 5

## 2012-08-14 MED ORDER — TECHNETIUM TC 99M SESTAMIBI GENERIC - CARDIOLITE
10.0000 | Freq: Once | INTRAVENOUS | Status: AC | PRN
  Administered 2012-08-14: 10 via INTRAVENOUS

## 2012-08-14 NOTE — Progress Notes (Signed)
  Patient Name: Alejandro Reyes      SUBJECTIVE admitted with chest pain with non specified hsitory of MI Hx of HTN unknown lipid status  Tn neg and scheduled for myoview this am  Past Medical History  Diagnosis Date  . Hypertension     PHYSICAL EXAM Filed Vitals:   08/14/12 1112 08/14/12 1137 08/14/12 1139 08/14/12 1141  BP: 157/61 204/52 177/59 128/64  Pulse: 99 113 113 113  Temp:      TempSrc:      Resp:      Height:      Weight:      SpO2:        Well appearing middle aged man, NAD HEENT: Unremarkable Neck:  No JVD, no thyromegally Lungs:  Clear with no wheezes. HEART:  Regular rate rhythm, no murmurs, no rubs, no clicks Abd:  Flat, positive bowel sounds, no organomegally, no rebound, no guarding Ext:  2 plus pulses, no edema, no cyanosis, no clubbing Skin:  No rashes no nodules Neuro:  CN II through XII intact, motor grossly intact   TELEMETRY: Reviewed telemetry pt in nsr     Intake/Output Summary (Last 24 hours) at 08/14/12 1231 Last data filed at 08/13/12 1726  Gross per 24 hour  Intake    480 ml  Output      0 ml  Net    480 ml    LABS: Basic Metabolic Panel:  Lab 08/13/12 4098 08/12/12 2126  NA 138 140  K 4.1 3.7  CL 106 107  CO2 23 25  GLUCOSE 98 127*  BUN 11 12  CREATININE 0.76 0.78  CALCIUM 9.8 9.6  MG -- --  PHOS -- --   Cardiac Enzymes:  Basename 08/13/12 1537 08/13/12 0912 08/13/12 0325  CKTOTAL -- -- --  CKMB -- -- --  CKMBINDEX -- -- --  TROPONINI <0.30 <0.30 <0.30   CBC:  Lab 08/13/12 0328 08/12/12 2126  WBC 4.7 4.7  NEUTROABS -- --  HGB 13.5 13.3  HCT 39.1 38.4*  MCV 84.3 84.6  PLT 143* 140*    ASSESSMENT AND PLAN:     Signed, Sherryl Manges MD  08/14/2012  Cardiology followup  His stress test is negative for ischemia. No additional cardiac workup. Ok for discharge from our perspective.  Leonia Reeves.D.

## 2012-08-14 NOTE — Progress Notes (Signed)
*  PRELIMINARY RESULTS* Vascular Ultrasound Carotid Duplex (Doppler) has been completed.   Bilateral internal carotid arteries demonstrate elevated velocities in the 40-59% range, and the external carotid arteries also demonstrate elevated velocities, however there is no significant plaque morphology to support stenosis. Vertebral arteries are patent with antegrade flow.  08/14/2012 2:35 PM Gertie Fey, RDMS, RDCS

## 2012-08-14 NOTE — Progress Notes (Addendum)
[facility] PROGRESS NOTE  Asmar Brozek ZOX:[id number] DOB: 1960/11/25 DOA: 08/12/2012 PCP: No primary provider on file.  Assessment/Plan: 1. Chest pain: ruled out ACS.  - enzymes so far neg - cardiology consult appreciated. -  myo view today showed no reversible ischemia or infarction. There is some septal hypokinesia and left ventricular EF is 44%.  - started the patient on aspirin / lisinopril.   2. COPD: STABLE .   3. Smoking: on nicotine patch .   Resume home medications.   DVT prophylaxis: lovenox.   Consultants: cardiology Antibiotics:none  HPI/Subjective: Chest pain improved.   Objective: Filed Vitals:   08/14/12 1112 08/14/12 1137 08/14/12 1139 08/14/12 1141  BP: 157/61 204/52 177/59 128/64  Pulse: 99 113 113 113  Temp:      TempSrc:      Resp:      Height:      Weight:      SpO2:        Intake/Output Summary (Last 24 hours) at 08/14/12 1314 Last data filed at 08/13/12 1726  Gross per 24 hour  Intake    240 ml  Output      0 ml  Net    240 ml   Filed Weights   08/13/12 0343  Weight: 105.3 kg (232 lb 2.3 oz)    Exam:   General:  ALERT AFEBRILE COMFORTABLE  Cardiovascular: s1s2 RRR, No MRG  Respiratory: CTAB, no wheezing rhonchi or rales  Abdomen: soft NT ND BS+  Extremities: no pedal edema cyanosis or clubbing.   Data Reviewed: Basic Metabolic Panel:  Lab 08/13/12 8119 08/12/12 2126  NA 138 140  K 4.1 3.7  CL 106 107  CO2 23 25  GLUCOSE 98 127*  BUN 11 12  CREATININE 0.76 0.78  CALCIUM 9.8 9.6  MG -- --  PHOS -- --   Liver Function Tests: No results found for this basename: AST:5,ALT:5,ALKPHOS:5,BILITOT:5,PROT:5,ALBUMIN:5 in the last 168 hours No results found for this basename: LIPASE:5,AMYLASE:5 in the last 168 hours No results found for this basename: AMMONIA:5 in the last 168 hours CBC:  Lab 08/13/12 0328 08/12/12 2126  WBC 4.7 4.7  NEUTROABS -- --  HGB 13.5 13.3  HCT 39.1 38.4*  MCV 84.3 84.6  PLT 143*  140*   Cardiac Enzymes:  Lab 08/13/12 1537 08/13/12 0912 08/13/12 0325  CKTOTAL -- -- --  CKMB -- -- --  CKMBINDEX -- -- --  TROPONINI <0.30 <0.30 <0.30   BNP (last 3 results) No results found for this basename: PROBNP:3 in the last 8760 hours CBG: No results found for this basename: GLUCAP:5 in the last 168 hours  No results found for this or any previous visit (from the past 240 hour(s)).      Scheduled Meds:    . aspirin EC  325 mg Oral Daily  . enoxaparin (LOVENOX) injection  40 mg Subcutaneous Q24H  . influenza  inactive virus vaccine  0.5 mL Intramuscular Tomorrow-1000  . lisinopril  10 mg Oral Daily  . nicotine  21 mg Transdermal Daily  . nitroGLYCERIN  1 inch Topical Q6H  . pantoprazole  40 mg Oral Daily  . regadenoson      . regadenoson  0.4 mg Intravenous Once  . sodium chloride  3 mL Intravenous Q12H   Continuous Infusions:   Principal Problem:  *Chest pain Active Problems:  HYPERTENSION  Tobacco abuse        Roen Macgowan  [facility] Pager [phone]. If 8PM-8AM, please contact night-coverage  at [email or web address], password [facility] 08/14/2012, 1:14 PM  LOS: 2 days

## 2012-08-14 NOTE — Progress Notes (Signed)
Patient evaluated briefly in nuc med. Patient has no prior cardiac history, but does have history of HTN and ongoing tobacco abuse. Presented with intermittent chest pain x2 wks. CTA chest was negative for PE, but did show significant calcification of the abdominal aorta and branches withoutsignificant calcification of the coronary arteries. Cardiac enzymes normal x3. EKG without acute ischemic changes. He is currently in sinus rhythm with rates in the 90s. Was noted to be in the 130s upon arrival nuc med. Telemetry reviewed from last night shows sinus 90-100s with some bursts in the 130s. He was not symptomatic with these tachycardic episodes. He had an episode of chest pain last evening that was relieved with dilaudid, no EKG changes. He reports a history of heavy alcohol abuse, but has been sober since 2009. Denies illicit drug use. Will perform lexiscan myoview this morning with further plans pending results.   Hickman, PA-C 08/14/2012 11:39 AM  [phone] pgr

## 2012-08-15 DIAGNOSIS — I509 Heart failure, unspecified: Secondary | ICD-10-CM

## 2012-08-15 DIAGNOSIS — I5022 Chronic systolic (congestive) heart failure: Secondary | ICD-10-CM | POA: Diagnosis present

## 2012-08-15 DIAGNOSIS — R079 Chest pain, unspecified: Secondary | ICD-10-CM

## 2012-08-15 MED ORDER — LOSARTAN POTASSIUM-HCTZ 50-12.5 MG PO TABS: 1.0000 | ORAL_TABLET | Freq: Every day | ORAL | Status: DC

## 2012-08-15 MED ORDER — CARVEDILOL 6.25 MG PO TABS: 6.2500 mg | ORAL_TABLET | Freq: Two times a day (BID) | ORAL | Status: DC

## 2012-08-15 MED ORDER — LIVING BETTER WITH HEART FAILURE BOOK
Freq: Once | Status: DC
  Filled 2012-08-15: qty 1

## 2012-08-15 NOTE — Progress Notes (Signed)
Subjective:  The patient has no complaints today.  Denies any chest pain or shortness of breath.  He states he intends to quit smoking.  Objective:  Vital Signs in the last 24 hours: Temp:  [97.9 F (36.6 C)-98 F (36.7 C)] 98 F (36.7 C) (10/28 0455) Pulse Rate:  [99-113] 100  (10/28 0455) Resp:  [18] 18  (10/28 0455) BP: (128-204)/(52-70) 137/70 mmHg (10/28 0455) SpO2:  [95 %-100 %] 95 % (10/28 0455)  Intake/Output from previous day:   Intake/Output from this shift:       . aspirin EC  325 mg Oral Daily  . enoxaparin (LOVENOX) injection  40 mg Subcutaneous Q24H  . influenza  inactive virus vaccine  0.5 mL Intramuscular Tomorrow-1000  . lisinopril  10 mg Oral Daily  . nicotine  21 mg Transdermal Daily  . nitroGLYCERIN  1 inch Topical Q6H  . pantoprazole  40 mg Oral Daily  . regadenoson      . regadenoson  0.4 mg Intravenous Once  . sodium chloride  3 mL Intravenous Q12H      Physical Exam: The patient appears to be in no distress.  Head and neck exam reveals that the pupils are equal and reactive.  The extraocular movements are full.  There is no scleral icterus.  Mouth and pharynx are benign.  No lymphadenopathy.  No carotid bruits.  The jugular venous pressure is normal.  Thyroid is not enlarged or tender.  Chest is clear to percussion and auscultation.  No rales or rhonchi.  Expansion of the chest is symmetrical.  Heart reveals no abnormal lift or heave.  First and second heart sounds are normal.  There is no murmur gallop rub or click.  The abdomen is soft and nontender.  Bowel sounds are normoactive.  There is no hepatosplenomegaly or mass.  There are no abdominal bruits.  Extremities reveal no phlebitis or edema.  Pedal pulses are good.  There is no cyanosis or clubbing.  Neurologic exam is normal strength and no lateralizing weakness.  No sensory deficits.  Integument reveals no rash  Lab Results:  Basename 08/13/12 0328 08/12/12 2126  WBC 4.7 4.7    HGB 13.5 13.3  PLT 143* 140*    Basename 08/13/12 0328 08/12/12 2126  NA 138 140  K 4.1 3.7  CL 106 107  CO2 23 25  GLUCOSE 98 127*  BUN 11 12  CREATININE 0.76 0.78    Basename 08/13/12 1537 08/13/12 0912  TROPONINI <0.30 <0.30   Hepatic Function Panel No results found for this basename: PROT,ALBUMIN,AST,ALT,ALKPHOS,BILITOT,BILIDIR,IBILI in the last 72 hours No results found for this basename: CHOL in the last 72 hours No results found for this basename: PROTIME in the last 72 hours  Imaging: Nm Myocar Multi W/spect W/wall Motion / Ef  08/14/2012  *RADIOLOGY REPORT*  Clinical Data:  51 year old male with chest pain and hypertension.  MYOCARDIAL IMAGING WITH SPECT (REST AND PHARMACOLOGIC-STRESS) GATED LEFT VENTRICULAR WALL MOTION STUDY LEFT VENTRICULAR EJECTION FRACTION  Technique:  Resting myocardial SPECT imaging was initially performed after intravenous administration of radiopharmaceutical. Myocardial SPECT was subsequently performed after additional radiopharmaceutical injection during pharmacologic-stress supervised by the Cardiology staff.  Quantitative gated imaging was also performed to evaluate left ventricular wall motion, and estimate left ventricular ejection fraction.  Radiopharmaceutical:  Tc-36m Myoview 10 mCi at rest and during stress.  Comparison: none  Findings:  Technique: Study is adequate.  Perfusion:  There are no relative decreased counts on stress or  rest to suggest reversible ischemia or infarction.  Wall motion: Septal hypokinesia.  Left ventricular ejection fraction:  Calculated left ventricular ejection fraction =  44%  IMPRESSION:  1.  No reversible ischemia or infarction.  2. Septal hypokinesia  3.  Left ventricular ejection fraction equal 44%   Original Report Authenticated By: Genevive Bi, M.D.     Cardiac Studies: Telemetry shows sinus tachycardia Assessment/Plan:  Patient Active Hospital Problem List: Chest pain (08/13/2012)   Assessment:  Myoview shows no ischemia    Plan: No further cardiac workup at this time.Because of his persistent sinus tachycardia would consider checking thyroid function studies.  He may also benefit from the addition of metoprolol or carvedilol in view of his sinus tachycardia and his left ventricular ejection fraction.  Smoking cessation was emphasized to him again today.     LOS: 3 days    Cassell Clement 08/15/2012, 8:22 AM

## 2012-08-15 NOTE — Progress Notes (Signed)
Pt d/c home with instructions, r/x, and f/u appointment. Pt given HF education booklet and discussed responsibilities for living with HF. Pt verbalized understanding of instructions, pt home alone, escorted self out per request.

## 2012-08-15 NOTE — Discharge Summary (Signed)
Physician Discharge Summary  Alejandro Reyes ZOX:[id number] DOB: 1961/10/16 DOA: 08/12/2012  PCP: No primary provider on file.  Admit date: 08/12/2012 Discharge date: 08/15/2012  Time spent: 40 minutes  Recommendations for Outpatient Follow-up:  1. Patient has no primary care physician. He is being referred to [facility] for follow him for his new diagnosis of heart failure. 2. Patient will need a fasting lipid profile done as outpatient.  Discharge Diagnoses:  Principal Problem:  *Chest pain Active Problems:  Alcohol abuse, in remission  HYPERTENSION  COPD  Tobacco abuse  Systolic CHF, chronic   Discharge Condition: Improved, being discharged home  Diet recommendation: Low-sodium heart healthy  Filed Weights   08/13/12 0343  Weight: 105.3 kg (232 lb 2.3 oz)    History of present illness:  Alejandro Reyes is a 51 y.o. male who presents to the ED with complaints of intermittent substernal chest pain x 2 weeks. He reports having dull 10/10 pain at times, and at the worse the pain can last up to 2 hours. He denies having any SOB or Nausea or Diaphoresis associated with the pain. He was seen in the ED earlier in the week for the same symptoms. This visit to the ED a CTA of the chest was performed and found to be negative for pulmonary emboli.  Hospital Course:  1. Chest pain syndrome with negative cardiac enzymes and no ischemia by EKG: Patient underwent stress test Myoview. This noted no signs of reversible ischemia, but did note a decreased ejection fraction of 44%. Patient's chest pain resolved and he felt to not be an acute coronary syndrome. Stabilized and discharged. He'll follow up with [facility] 2. HTN : New diagnosis. Patient is on any medicines. Prescription is given for combination ARB/HCTZ plus Coreg 3. Tobacco abuse : Patient counseled. 4. COPD : Patient counseled., Currently stable medically she did this hospitalization. 5. History of ETOH abuse :  Stable. Patient has not drank in some time. 6. Bilateral carotid artery bruits : Noted Systolic congestive heart failure-chronic: Patient received education in the hospital. He didn't given a referral appointment with [facility] on 11/26 at 2:15 PM. He's been placed on a low-dose diuretic-HCTZ plus ARB and beta blocker. 7. Significant calcification of the abdominal aorta and branches but no significant calcification of the coronary arteries.: Will need to be checked as outpatient.   Procedures:  Status post nuclear medicine myocardial stress test: No evidence of reversible ischemic disease. Ejection fraction estimated at 45%.  Consultations:  [facility] cardiology on call for [facility]  Discharge Exam: Filed Vitals:   08/14/12 1141 08/14/12 1343 08/15/12 0455 08/15/12 1000  BP: 128/64 160/69 137/70 129/75  Pulse: 113 100 100   Temp:  97.9 F (36.6 C) 98 F (36.7 C)   TempSrc:   Oral   Resp:  18 18   Height:      Weight:      SpO2:  100% 95%     General: Alert and oriented x3, no acute distress, fatigued, looks slightly older than stated age Cardiovascular: Regular rate and rhythm, S1-S2, soft 2/6 systolic ejection murmur Respiratory: Decreased breath sounds throughout Abdomen: Soft, nontender, nondistended, positive bowel sounds Extremities: No clubbing or cyanosis, trace pitting edema  Discharge Instructions  Discharge Orders    Future Appointments: Provider: Department: Dept Phone: Center:   09/13/2012 2:30 PM Kathleene Hazel, MD [facility] [phone] [facility]     Future Orders Please Complete By Expires   Diet - low sodium  heart healthy      Increase activity slowly          Medication List     As of 08/15/2012 11:36 AM    TAKE these medications         carvedilol 6.25 MG tablet   Commonly known as: COREG   Take 1 tablet (6.25 mg total) by mouth 2 (two) times daily with a meal.      losartan-hydrochlorothiazide  50-12.5 MG per tablet   Commonly known as: HYZAAR   Take 1 tablet by mouth daily.           Follow-up Information    Follow up with [facility], MD. On 09/13/2012. (2:15pm)    Contact information:   [facility] [address] [place] [address] [phone]           The results of significant diagnostics from this hospitalization (including imaging, microbiology, ancillary and laboratory) are listed below for reference.    Significant Diagnostic Studies: Dg Chest 2 View  08/12/2012    IMPRESSION: No evidence of acute cardiopulmonary disease.   Original Report Authenticated By: Charline Bills, M.D.    Dg Chest 2 View  08/04/2012   IMPRESSION: No active disease.   Original Report Authenticated By: Danae Orleans, M.D.    Nm Myocar Multi W/spect W/wall Motion / Ef  08/14/2012    IMPRESSION:  1.  No reversible ischemia or infarction.  2. Septal hypokinesia  3.  Left ventricular ejection fraction equal 44%   Original Report Authenticated By: Genevive Bi, M.D.      CTA CHEST    IMPRESSION: No evidence of aortic dissection.  Chronic bronchitic changes with focal scarring centrally in the right middle lung and right upper lung, improved since previous study. Soft tissue density in the anterior mediastinum may represent residual thymus or adenopathy. This appears stable since previous study.    CTA ABDOMEN AND PELVIS  .  IMPRESSION: Diffuse atherosclerotic changes in the aorta and branch vessels. No evidence of aneurysm or dissection.  Original Report Authenticated By: Marlon Pel, M.D.     Labs: Basic Metabolic Panel:  Lab 08/13/12 9147 08/12/12 2126  NA 138 140  K 4.1 3.7  CL 106 107  CO2 23 25  GLUCOSE 98 127*  BUN 11 12  CREATININE 0.76 0.78  CALCIUM 9.8 9.6  MG -- --  PHOS -- --   CBC:  Lab 08/13/12 0328 08/12/12 2126  WBC 4.7 4.7  NEUTROABS -- --  HGB 13.5 13.3  HCT 39.1 38.4*  MCV 84.3 84.6  PLT 143* 140*    Cardiac Enzymes:  Lab 08/13/12 1537 08/13/12 0912 08/13/12 0325  CKTOTAL -- -- --  CKMB -- -- --  CKMBINDEX -- -- --  TROPONINI <0.30 <0.30 <0.30      Signed:  Hollice Espy  [facility] 08/15/2012, 11:36 AM

## 2012-08-24 ENCOUNTER — Inpatient Hospital Stay (HOSPITAL_COMMUNITY)
Admission: EM | Admit: 2012-08-24 | Discharge: 2012-08-28 | DRG: 281 | Disposition: A | Discharge status: Home / Self Care | Payer: 59 | Attending: Internal Medicine | Admitting: Internal Medicine

## 2012-08-24 ENCOUNTER — Emergency Department (HOSPITAL_COMMUNITY): Payer: 59

## 2012-08-24 ENCOUNTER — Encounter (HOSPITAL_COMMUNITY)
Admission: EM | Disposition: A | Discharge status: Home / Self Care | Payer: Self-pay | Source: Home / Self Care | Attending: Internal Medicine

## 2012-08-24 ENCOUNTER — Encounter (HOSPITAL_COMMUNITY): Payer: Self-pay | Admitting: Cardiology

## 2012-08-24 ENCOUNTER — Inpatient Hospital Stay (HOSPITAL_COMMUNITY): Payer: 59

## 2012-08-24 DIAGNOSIS — I5022 Chronic systolic (congestive) heart failure: Secondary | ICD-10-CM | POA: Diagnosis present

## 2012-08-24 DIAGNOSIS — K921 Melena: Secondary | ICD-10-CM | POA: Diagnosis present

## 2012-08-24 DIAGNOSIS — R634 Abnormal weight loss: Secondary | ICD-10-CM | POA: Diagnosis present

## 2012-08-24 DIAGNOSIS — I2510 Atherosclerotic heart disease of native coronary artery without angina pectoris: Secondary | ICD-10-CM

## 2012-08-24 DIAGNOSIS — E669 Obesity, unspecified: Secondary | ICD-10-CM | POA: Diagnosis present

## 2012-08-24 DIAGNOSIS — J4489 Other specified chronic obstructive pulmonary disease: Secondary | ICD-10-CM | POA: Diagnosis present

## 2012-08-24 DIAGNOSIS — I214 Non-ST elevation (NSTEMI) myocardial infarction: Principal | ICD-10-CM

## 2012-08-24 DIAGNOSIS — I319 Disease of pericardium, unspecified: Secondary | ICD-10-CM | POA: Diagnosis present

## 2012-08-24 DIAGNOSIS — R7402 Elevation of levels of lactic acid dehydrogenase (LDH): Secondary | ICD-10-CM | POA: Diagnosis present

## 2012-08-24 DIAGNOSIS — F1011 Alcohol abuse, in remission: Secondary | ICD-10-CM | POA: Diagnosis present

## 2012-08-24 DIAGNOSIS — I509 Heart failure, unspecified: Secondary | ICD-10-CM | POA: Diagnosis present

## 2012-08-24 DIAGNOSIS — I472 Ventricular tachycardia, unspecified: Secondary | ICD-10-CM | POA: Diagnosis present

## 2012-08-24 DIAGNOSIS — G4700 Insomnia, unspecified: Secondary | ICD-10-CM | POA: Diagnosis present

## 2012-08-24 DIAGNOSIS — D649 Anemia, unspecified: Secondary | ICD-10-CM | POA: Diagnosis present

## 2012-08-24 DIAGNOSIS — R079 Chest pain, unspecified: Secondary | ICD-10-CM

## 2012-08-24 DIAGNOSIS — J449 Chronic obstructive pulmonary disease, unspecified: Secondary | ICD-10-CM | POA: Diagnosis present

## 2012-08-24 DIAGNOSIS — J40 Bronchitis, not specified as acute or chronic: Secondary | ICD-10-CM

## 2012-08-24 DIAGNOSIS — I5181 Takotsubo syndrome: Secondary | ICD-10-CM | POA: Diagnosis present

## 2012-08-24 DIAGNOSIS — Z720 Tobacco use: Secondary | ICD-10-CM | POA: Diagnosis present

## 2012-08-24 DIAGNOSIS — R7309 Other abnormal glucose: Secondary | ICD-10-CM | POA: Diagnosis present

## 2012-08-24 DIAGNOSIS — I10 Essential (primary) hypertension: Secondary | ICD-10-CM | POA: Diagnosis present

## 2012-08-24 DIAGNOSIS — R0989 Other specified symptoms and signs involving the circulatory and respiratory systems: Secondary | ICD-10-CM | POA: Diagnosis present

## 2012-08-24 DIAGNOSIS — Z6828 Body mass index (BMI) 28.0-28.9, adult: Secondary | ICD-10-CM

## 2012-08-24 DIAGNOSIS — R7401 Elevation of levels of liver transaminase levels: Secondary | ICD-10-CM | POA: Diagnosis present

## 2012-08-24 DIAGNOSIS — K219 Gastro-esophageal reflux disease without esophagitis: Secondary | ICD-10-CM

## 2012-08-24 DIAGNOSIS — E0590 Thyrotoxicosis, unspecified without thyrotoxic crisis or storm: Secondary | ICD-10-CM

## 2012-08-24 DIAGNOSIS — F17200 Nicotine dependence, unspecified, uncomplicated: Secondary | ICD-10-CM | POA: Diagnosis present

## 2012-08-24 DIAGNOSIS — I4729 Other ventricular tachycardia: Secondary | ICD-10-CM | POA: Diagnosis present

## 2012-08-24 HISTORY — DX: Alcohol abuse, in remission: F10.11

## 2012-08-24 HISTORY — DX: Tobacco use: Z72.0

## 2012-08-24 HISTORY — DX: Chronic systolic (congestive) heart failure: I50.22

## 2012-08-24 HISTORY — DX: Chronic obstructive pulmonary disease, unspecified: J44.9

## 2012-08-24 HISTORY — DX: Gastro-esophageal reflux disease without esophagitis: K21.9

## 2012-08-24 HISTORY — PX: LEFT HEART CATHETERIZATION WITH CORONARY ANGIOGRAM: SHX5451

## 2012-08-24 LAB — PROTIME-INR: Prothrombin Time: 13.4 seconds (ref 11.6–15.2)

## 2012-08-24 LAB — CBC WITH DIFFERENTIAL/PLATELET
Basophils Relative: 0 % (ref 0–1)
Eosinophils Absolute: 0.2 10*3/uL (ref 0.0–0.7)
Eosinophils Relative: 2 % (ref 0–5)
Lymphs Abs: 2.7 10*3/uL (ref 0.7–4.0)
MCH: 29 pg (ref 26.0–34.0)
MCHC: 34.1 g/dL (ref 30.0–36.0)
MCV: 85.2 fL (ref 78.0–100.0)
Neutrophils Relative %: 44 % (ref 43–77)
Platelets: 175 10*3/uL (ref 150–400)
RBC: 4.58 MIL/uL (ref 4.22–5.81)

## 2012-08-24 LAB — COMPREHENSIVE METABOLIC PANEL
ALT: 83 U/L — ABNORMAL HIGH (ref 0–53)
Albumin: 3.4 g/dL — ABNORMAL LOW (ref 3.5–5.2)
BUN: 12 mg/dL (ref 6–23)
Calcium: 10.3 mg/dL (ref 8.4–10.5)
GFR calc Af Amer: >90 mL/min (ref >90)
Glucose, Bld: 125 mg/dL — ABNORMAL HIGH (ref 70–99)
Potassium: 3.9 mEq/L (ref 3.5–5.1)
Sodium: 140 mEq/L (ref 135–145)
Total Protein: 7.1 g/dL (ref 6.0–8.3)

## 2012-08-24 LAB — TROPONIN I
Troponin I: 0.62 ng/mL (ref <0.30)
Troponin I: 0.75 ng/mL (ref <0.30)

## 2012-08-24 LAB — MAGNESIUM: Magnesium: 1.7 mg/dL (ref 1.5–2.5)

## 2012-08-24 SURGERY — LEFT HEART CATHETERIZATION WITH CORONARY ANGIOGRAM
Anesthesia: LOCAL

## 2012-08-24 MED ORDER — SODIUM CHLORIDE 0.9 % IV SOLN: INTRAVENOUS | Status: DC

## 2012-08-24 MED ORDER — NITROGLYCERIN IN D5W 200-5 MCG/ML-% IV SOLN
2.0000 ug/min | Freq: Once | INTRAVENOUS | Status: AC
  Administered 2012-08-24: 5 ug/min via INTRAVENOUS
  Filled 2012-08-24: qty 250

## 2012-08-24 MED ORDER — HEPARIN SODIUM (PORCINE) 1000 UNIT/ML IJ SOLN
INTRAMUSCULAR | Status: AC
  Filled 2012-08-24: qty 1

## 2012-08-24 MED ORDER — ATORVASTATIN CALCIUM 40 MG PO TABS: 40.0000 mg | ORAL_TABLET | Freq: Every day | ORAL | Status: DC

## 2012-08-24 MED ORDER — PANTOPRAZOLE SODIUM 40 MG PO TBEC: 40.0000 mg | DELAYED_RELEASE_TABLET | Freq: Every day | ORAL | Status: DC

## 2012-08-24 MED ORDER — SODIUM CHLORIDE 0.9 % IJ SOLN: 3.0000 mL | Freq: Two times a day (BID) | INTRAMUSCULAR | Status: DC

## 2012-08-24 MED ORDER — ASPIRIN 300 MG RE SUPP: 300.0000 mg | RECTAL | Status: DC

## 2012-08-24 MED ORDER — SODIUM CHLORIDE 0.9 % IV SOLN: INTRAVENOUS | Status: AC

## 2012-08-24 MED ORDER — SODIUM CHLORIDE 0.9 % IJ SOLN: 3.0000 mL | INTRAMUSCULAR | Status: DC | PRN

## 2012-08-24 MED ORDER — ATORVASTATIN CALCIUM 40 MG PO TABS
40.0000 mg | ORAL_TABLET | Freq: Every day | ORAL | Status: DC
  Administered 2012-08-24 – 2012-08-27 (×4): 40 mg via ORAL
  Filled 2012-08-24 (×6): qty 1

## 2012-08-24 MED ORDER — PANTOPRAZOLE SODIUM 40 MG PO TBEC
40.0000 mg | DELAYED_RELEASE_TABLET | Freq: Every day | ORAL | Status: DC
  Administered 2012-08-24: 40 mg via ORAL
  Filled 2012-08-24: qty 1

## 2012-08-24 MED ORDER — ASPIRIN 81 MG PO CHEW
324.0000 mg | CHEWABLE_TABLET | Freq: Once | ORAL | Status: AC
  Administered 2012-08-24: 324 mg via ORAL
  Filled 2012-08-24: qty 4

## 2012-08-24 MED ORDER — CARVEDILOL 6.25 MG PO TABS
6.2500 mg | ORAL_TABLET | Freq: Two times a day (BID) | ORAL | Status: DC
  Administered 2012-08-24: 6.25 mg via ORAL
  Filled 2012-08-24 (×4): qty 1

## 2012-08-24 MED ORDER — GI COCKTAIL ~~LOC~~
30.0000 mL | Freq: Once | ORAL | Status: AC
  Administered 2012-08-24: 30 mL via ORAL
  Filled 2012-08-24: qty 30

## 2012-08-24 MED ORDER — LIDOCAINE HCL (PF) 1 % IJ SOLN
INTRAMUSCULAR | Status: AC
  Filled 2012-08-24: qty 30

## 2012-08-24 MED ORDER — OXYCODONE-ACETAMINOPHEN 5-325 MG PO TABS
1.0000 | ORAL_TABLET | ORAL | Status: DC | PRN
  Administered 2012-08-25: 2 via ORAL
  Filled 2012-08-24: qty 2

## 2012-08-24 MED ORDER — LISINOPRIL 10 MG PO TABS
10.0000 mg | ORAL_TABLET | Freq: Every day | ORAL | Status: DC
  Administered 2012-08-24 – 2012-08-28 (×4): 10 mg via ORAL
  Filled 2012-08-24 (×5): qty 1

## 2012-08-24 MED ORDER — FENTANYL CITRATE 0.05 MG/ML IJ SOLN
INTRAMUSCULAR | Status: AC
  Filled 2012-08-24: qty 2

## 2012-08-24 MED ORDER — MIDAZOLAM HCL 2 MG/2ML IJ SOLN
INTRAMUSCULAR | Status: AC
  Filled 2012-08-24: qty 2

## 2012-08-24 MED ORDER — ACETAMINOPHEN 325 MG PO TABS: 650.0000 mg | ORAL_TABLET | ORAL | Status: DC | PRN

## 2012-08-24 MED ORDER — HEPARIN (PORCINE) IN NACL 100-0.45 UNIT/ML-% IJ SOLN
1400.0000 [IU]/h | Freq: Once | INTRAMUSCULAR | Status: AC
  Administered 2012-08-24: 1400 [IU]/h via INTRAVENOUS
  Filled 2012-08-24 (×2): qty 250

## 2012-08-24 MED ORDER — NITROGLYCERIN 0.4 MG SL SUBL: 0.4000 mg | SUBLINGUAL_TABLET | SUBLINGUAL | Status: DC | PRN

## 2012-08-24 MED ORDER — ASPIRIN EC 81 MG PO TBEC
81.0000 mg | DELAYED_RELEASE_TABLET | Freq: Every day | ORAL | Status: DC
  Administered 2012-08-25 – 2012-08-28 (×3): 81 mg via ORAL
  Filled 2012-08-24 (×4): qty 1

## 2012-08-24 MED ORDER — HEPARIN (PORCINE) IN NACL 2-0.9 UNIT/ML-% IJ SOLN
INTRAMUSCULAR | Status: AC
  Filled 2012-08-24: qty 1000

## 2012-08-24 MED ORDER — SODIUM CHLORIDE 0.9 % IV SOLN: 250.0000 mL | INTRAVENOUS | Status: DC | PRN

## 2012-08-24 MED ORDER — METOPROLOL TARTRATE 1 MG/ML IV SOLN
INTRAVENOUS | Status: AC
  Filled 2012-08-24: qty 10

## 2012-08-24 MED ORDER — VERAPAMIL HCL 2.5 MG/ML IV SOLN
INTRAVENOUS | Status: AC
  Filled 2012-08-24: qty 2

## 2012-08-24 MED ORDER — ASPIRIN 81 MG PO CHEW: 81.0000 mg | CHEWABLE_TABLET | Freq: Every day | ORAL | Status: DC

## 2012-08-24 MED ORDER — DIAZEPAM 2 MG PO TABS: 2.0000 mg | ORAL_TABLET | ORAL | Status: DC

## 2012-08-24 MED ORDER — CARVEDILOL 6.25 MG PO TABS: 6.2500 mg | ORAL_TABLET | Freq: Two times a day (BID) | ORAL | Status: DC

## 2012-08-24 MED ORDER — HEPARIN BOLUS VIA INFUSION
4000.0000 [IU] | Freq: Once | INTRAVENOUS | Status: AC
  Administered 2012-08-24: 4000 [IU] via INTRAVENOUS

## 2012-08-24 MED ORDER — SODIUM CHLORIDE 0.9 % IJ SOLN
3.0000 mL | Freq: Two times a day (BID) | INTRAMUSCULAR | Status: DC
  Administered 2012-08-24 – 2012-08-28 (×7): 3 mL via INTRAVENOUS

## 2012-08-24 MED ORDER — MORPHINE SULFATE 2 MG/ML IJ SOLN
2.0000 mg | INTRAMUSCULAR | Status: DC | PRN
  Administered 2012-08-24 – 2012-08-27 (×8): 2 mg via INTRAVENOUS
  Filled 2012-08-24 (×8): qty 1

## 2012-08-24 MED ORDER — NITROGLYCERIN 0.2 MG/ML ON CALL CATH LAB
INTRAVENOUS | Status: AC
  Filled 2012-08-24: qty 1

## 2012-08-24 MED ORDER — ONDANSETRON HCL 4 MG/2ML IJ SOLN: 4.0000 mg | Freq: Four times a day (QID) | INTRAMUSCULAR | Status: DC | PRN

## 2012-08-24 MED ORDER — ASPIRIN 81 MG PO CHEW: 324.0000 mg | CHEWABLE_TABLET | ORAL | Status: DC

## 2012-08-24 MED ORDER — GI COCKTAIL ~~LOC~~
30.0000 mL | ORAL | Status: AC
  Administered 2012-08-24: 30 mL via ORAL
  Filled 2012-08-24: qty 30

## 2012-08-24 NOTE — H&P (Signed)
CARDIOLOGY ADMISSION HISTORY & PHYSICAL  Patient ID: Alejandro Reyes MRN: [id number], DOB/AGE: [birth year]   Date of Admission: 08/24/2012  Primary Physician: No primary provider on file. Primary Cardiologist: [facility] Reason for Admission:chest pain and elevated troponin  History of Present Illness Patient is a 51 year old man with PMH of recurrent chest pain, chronic systolic CHF, HTN, Ongoing Tobacco abuse, COPD, GERD and Alcohol abuse in remission who presents with chest pain that started about 2 weeks ago that has been waxing and waning since the onset. Patient reports having intervals of "slow building" of "searing" chest pain that is located substernally and does not radiate. The pain is severe and lasts about an hour before spontaneously resolving without intervention. The pain has no pattern and is not triggered by eating or exertion. He reports that he has up to 4x chest pain per day now. He denies associated nausea, dizziness, and diaphoresis. No aggravating/allevaiting factors. Patient denies fever, headache, visual changes, vomiting, diarrhea, abdominal pain, numbness/tingling. He presented to ED for further evaluation. Troponin 0.62, EKG unremarkable. Heparin gtt initiated in ED.  Patient is currently asymptomatic.    Of note, he was discharged one weeks ago on 08/15/12 after evaluation of chest pain. He had negative cardiac enzymes and no ischemia by EKG.  His Myoview showed EF of 44% with Septal hypokinesia but no signs of reversible ischemia. ETT showed non diagnostic EKG.    Chest/Abdomen/pelvc CT negative for PE or dissection. Diffuse atherosclerotic changes in the aorta and branch vessels.   Past Medical History  Diagnosis Date  . Hypertension   . Chronic systolic CHF (congestive heart failure), NYHA class 1   . Tobacco abuse   . COPD (chronic obstructive pulmonary disease)   . GERD (gastroesophageal reflux disease)   . Alcohol abuse, in remission     Past  Surgical History  Procedure Date  . Tonsillectomy      Allergies/Intolerances No Known Allergies  Home Medications   Medication List     As of 08/24/2012 11:40 AM    ASK your doctor about these medications         alum & mag hydroxide-simeth 200-200-20 MG/5ML suspension   Commonly known as: MAALOX/MYLANTA   Take 15 mLs by mouth every 6 (six) hours as needed. For acid relux      carvedilol 6.25 MG tablet   Commonly known as: COREG   Take 1 tablet (6.25 mg total) by mouth 2 (two) times daily with a meal.      losartan-hydrochlorothiazide 50-12.5 MG per tablet   Commonly known as: HYZAAR   Take 1 tablet by mouth daily.      omeprazole 20 MG capsule   Commonly known as: PRILOSEC   Take 20 mg by mouth daily.          Family History Family History  Problem Relation Age of Onset  . Hypertension Father   . Cancer - Other Father     SCCa of Oropharynx     Social History History   Social History  . Marital Status: Single    Spouse Name: N/A    Number of Children: N/A  . Years of Education: N/A   Occupational History  . Not on file.   Social History Main Topics  . Smoking status: Current Every Day Smoker    Types: Cigarettes  . Smokeless tobacco: Not on file  . Alcohol Use: No  . Drug Use: No  . Sexually Active:    Other  Topics Concern  . Not on file   Social History Narrative  . No narrative on file     Review of Systems General:  No chills, fever, night sweats or weight changes.  Cardiovascular:  positive chest pain, denies dyspnea on exertion, edema, orthopnea, palpitations, paroxysmal nocturnal dyspnea. Dermatological: No rash, lesions or masses. Respiratory: No cough, dyspnea. Urologic: No hematuria, dysuria. Abdominal:   No nausea, vomiting, diarrhea, bright red blood per rectum, melena, or hematemesis. Neurologic:  No visual changes, weakness, changes in mental status. All other systems reviewed and are otherwise negative except as noted  above.  Physical Exam Blood pressure 132/65, pulse 86, temperature 97.4 F (36.3 C), temperature source Oral, resp. rate 20, height 6' 2.02" (1.88 m), weight 226 lb (102.513 kg), SpO2 100.00%.  General: Well developed, well appearing, in no acute distress. Stresses out HEENT: Normocephalic, atraumatic. EOMs intact. Sclera nonicteric. Oropharynx clear.  Neck: Supple. Positive carotid bruits L>R. No JVD. Lungs:  Respirations regular and unlabored, CTA bilaterally. Heart: RRR. S1, S2 present. +s4 2/6 SEM at right 2nd intercostal space and left sternal boarder.   Abdomen: Soft, non-tender, non-distended. BS present x 4 quadrants. No hepatosplenomegaly.  Extremities: No clubbing, cyanosis or edema. DP/PT/Radials 2+ and equal bilaterally. Psych: Normal affect. Neuro: Alert and oriented X 3. Moves all extremities spontaneously. Musculoskeletal: No kyphosis. Skin: Intact. Warm and dry. No rashes or petechiae in exposed areas.   Labs  Basename 08/24/12 0853  CKTOTAL --  CKMB --  TROPONINI 0.62*   Lab Results  Component Value Date   WBC 7.0 08/24/2012   HGB 13.3 08/24/2012   HCT 39.0 08/24/2012   MCV 85.2 08/24/2012   PLT 175 08/24/2012     Lab 08/24/12 0853  NA 140  K 3.9  CL 101  CO2 31  BUN 12  CREATININE 0.80  CALCIUM 10.3  PROT 7.1  BILITOT 0.3  ALKPHOS 125*  ALT 83*  AST 31  GLUCOSE 125*   Lab Results  Component Value Date   DDIMER 0.27 08/04/2012    Radiology/Studies Dg Chest 2 View  08/24/2012  *RADIOLOGY REPORT*  Clinical Data: Chest pain, smoking history  CHEST - 2 VIEW  Comparison: Chest x-ray of 08/12/2012 and CT angio chest of the same date.  Findings: No active infiltrate or effusion is seen.  There is peribronchial thickening consistent with bronchitis, possibly chronic.  Mediastinal contours are stable.  The heart is within normal limits in size.  No acute bony abnormality is seen.  IMPRESSION: Peribronchial thickening consistent with bronchitis.  No pneumonia  or effusion.   Original Report Authenticated By: Dwyane Dee, M.D.    Dg Chest 2 View  08/12/2012  *RADIOLOGY REPORT*  Clinical Data: Chest pain  CHEST - 2 VIEW  Comparison: 08/04/2012  Findings: Lungs are essentially clear.  No pleural effusion or pneumothorax.  Apparent mild patchy left midlung opacity likely reflects normal pulmonary markings, as it is unchanged from multiple prior studies and without correlate on prior CT.  Cardiomediastinal silhouette is within normal limits.  Mild degenerative changes of the visualized thoracolumbar spine.  IMPRESSION: No evidence of acute cardiopulmonary disease.   Original Report Authenticated By: Charline Bills, M.D.    Dg Chest 2 View  08/04/2012  *RADIOLOGY REPORT*  Clinical Data: Chest pain.  CHEST - 2 VIEW  Comparison: 11/18/2009  Findings: Previously seen opacity in the central right upper lobe is no longer visualized.  No evidence of acute infiltrate or edema. No evidence of pleural effusion.  Heart size is normal.  There is no hilar or mediastinal lymphadenopathy identified.  IMPRESSION: No active disease.   Original Report Authenticated By: Danae Orleans, M.D.    Nm Myocar Multi W/spect W/wall Motion / Ef  08/14/2012  *RADIOLOGY REPORT*  Clinical Data:  51 year old male with chest pain and hypertension.  MYOCARDIAL IMAGING WITH SPECT (REST AND PHARMACOLOGIC-STRESS) GATED LEFT VENTRICULAR WALL MOTION STUDY LEFT VENTRICULAR EJECTION FRACTION  Technique:  Resting myocardial SPECT imaging was initially performed after intravenous administration of radiopharmaceutical. Myocardial SPECT was subsequently performed after additional radiopharmaceutical injection during pharmacologic-stress supervised by the Cardiology staff.  Quantitative gated imaging was also performed to evaluate left ventricular wall motion, and estimate left ventricular ejection fraction.  Radiopharmaceutical:  Tc-5m Myoview 10 mCi at rest and during stress.  Comparison: none   Findings:  Technique: Study is adequate.  Perfusion:  There are no relative decreased counts on stress or rest to suggest reversible ischemia or infarction.  Wall motion: Septal hypokinesia.  Left ventricular ejection fraction:  Calculated left ventricular ejection fraction =  44%  IMPRESSION:  1.  No reversible ischemia or infarction.  2. Septal hypokinesia  3.  Left ventricular ejection fraction equal 44%   Original Report Authenticated By: Genevive Bi, M.D.    Ct Angio Chest Aortic Dissect W &/or W/o  08/13/2012  *RADIOLOGY REPORT*  Clinical Data:  2 weeks of sternal chest pain shooting to the back and neck.  CT ANGIOGRAPHY CHEST  Technique:  Multidetector CT imaging through the chest, abdomen and pelvis was performed using the standard protocol during bolus administration of intravenous contrast.  Multiplanar reconstructed images including MIPs were obtained and reviewed to evaluate the vascular anatomy.  Contrast: OMNIPAQUE IOHEXOL 350 MG/ML SOLN  Comparison:   10/29/2009 CT chest.  CTA CHEST  Findings:  Unenhanced images demonstrate normal caliber of the thoracic aorta without significant aortic calcification.  No significant coronary artery calcification.  Incidental note of a 19 mm nodule in the left adrenal gland containing fat density consistent with a benign lipoma.  Contrast enhanced images demonstrate stable appearance of soft tissue in the anterior mediastinum which may represent adenopathy or thymic tissue.  Additional mediastinal lymph nodes are not pathologically enlarged.  Normal caliber thoracic aorta.  No evidence of dissection.  Motion artifact in the ascending thoracic aorta.  No pleural effusions.  No pneumothorax.  There is scarring and infiltration centrally in the right middle lung and right upper lung which is improving since the previous study.  Airways appear patent.  Peribronchial thickening consistent with chronic bronchitis.  Minimal emphysematous changes in the apices.   No acute consolidation.  Diffuse prominence of the thyroid gland without focal nodularity.  Normal alignment of the thoracic spine with mild degenerative change.   Review of the MIP images confirms the above findings.  IMPRESSION: No evidence of aortic dissection.  Chronic bronchitic changes with focal scarring centrally in the right middle lung and right upper lung, improved since previous study. Soft tissue density in the anterior mediastinum may represent residual thymus or adenopathy. This appears stable since previous study.  CTA ABDOMEN AND PELVIS  Findings:  Calcification and atherosclerotic changes in the abdominal aorta, common iliac arteries, and external iliac arteries.  No evidence of aneurysm or dissection.  The celiac axis, superior mesenteric artery, single right renal artery, and duplicated left renal arteries appear patent.  The inferior mesenteric artery is patent.  The iliac, external iliac, and internal iliac arteries are patent although  there may be some stenosis of the origins of the internal iliac arteries bilaterally.  Sub centimeter cyst in the right lobe of the liver anteriorly.  The liver, spleen, gallbladder, pancreas, kidneys, and retroperitoneal lymph nodes are otherwise unremarkable.  Fat-containing nodule in the left adrenal gland as previously discussed consistent with adenoma.  The stomach, small bowel, and colon are not abnormally distended.  No free air or free fluid in the abdomen.  No abnormal mesenteric or retroperitoneal fluid collections.  Pelvis:  The prostate gland is not enlarged.  Bladder wall is not thickened.  No inflammatory changes in the sigmoid colon.  The appendix is normal.  Normal alignment of the lumbar vertebra with mild degenerative change.   Review of the MIP images confirms the above findings.  IMPRESSION: Diffuse atherosclerotic changes in the aorta and branch vessels. No evidence of aneurysm or dissection.  Note that imaging of the abdomen and pelvis was  not order was obtained in error.  Imaging and interpretation of the abdomen pelvis is obtained at no charge to the patient.   Original Report Authenticated By: Marlon Pel, M.D.    12-lead ECG. NSR, Lead V2 ST elevation.   Telemetry: NSR  Assessment and Plan 1. NSTEMI    Patient presents with chest pain and mild elevated troponin. No ischemic changes noted on EKG. Multiple CAD risk factors noted including newly diagnosed HTN and Tobacco abuse.   - admit to CCU - O2, Morphine, NTG gtt and heparin gtt - ASA, statin, Betablocker and ACEI added  - Trend CE and EKG, also check Echo - Emergent cardiac cath today - risk stratifications including TSH, free T4, Lipid panel, Hb A1C, Tobacco cessation, HTN control.  2. Chronic systolic CHF, EF 78% on Myoview. Stable, will check echo  3. Elevated transaminase level- likely associated with his NSTEMI, will trend. May need hepatitis workup as an outpatient if not better  4. HTN- Betablocker coreg and ACEI lisinopril added. Hold home dose Hyzaar  5. Tobacco abuse- counsel  6. COPD- Not formally evaluated. Need outpatient pulmonary evaluation.  7. GERD-PPI 8. Alcohol abuse in remission-SW 9. VTE: Heparin drip   Signed, LI, NA, PGY-2 08/24/2012, 11:39 AM   Patient seen and examined with Dr. Dierdre Searles. We discussed all aspects of the encounter. I agree with the assessment and plan as stated above.   Mr. Reier is a 51 y/o [employer] employee with h/o severe HTN, tobacco use and previous ETOH use. Denies h/o known CAD has never had a cath. Several recent ER visits for CP. Last week CT scan negative for PE or dissection + diffuse atherosclerosis. Myoview EF 44% no ischemia or scar. Presents today with recurrent CP now having it 4x per day at rest and with exertion. Worse with lying down. Trop 0.62. ECG non-acute.  He has multiple CRFs with diffuse atherosclerosis on CT with mildly + Trop. Suspect NSTEMI. However CP is somewhat atypical for angina and  myo/pericarditis also in differential.  Will need cath today - sooner rather than later. Also check echo.   TSH on recent admit markedly suppressed so would recheck full TFTs and may need endo to see.   Counseled on need to stop smoking.   Bevelyn Buckles. Bensimhon MD

## 2012-08-24 NOTE — ED Provider Notes (Signed)
Medical screening examination/treatment/procedure(s) were conducted as a shared visit with non-physician practitioner(s) and myself.  I personally evaluated the patient during the encounter  3rd ED visit for intermittent stabbing chest pain.  Negative CTPE and stress test 10/28.  EKG nonischemic but subtle ST elevation in lead v2.  Trop 0.6.  D/w Dr. Excell Seltzer and Bensimhon.  Plan to cath today.  Glynn Octave, MD 08/24/12 [phone]

## 2012-08-24 NOTE — ED Notes (Signed)
Pt reports acid reflux over the past couple of weeks. Reports a burning sensation in his stomach and throat.

## 2012-08-24 NOTE — Progress Notes (Signed)
ANTICOAGULATION CONSULT NOTE - Initial Consult  Pharmacy Consult for UFH Indication: NSTEMI  No Known Allergies  Patient Measurements: Height: 6' 2.02" (188 cm) Weight: 232 lb 2.3 oz (105.3 kg) IBW/kg (Calculated) : 82.24  Heparin Dosing Weight: 105kg  Vital Signs: Temp: 97.4 F (36.3 C) (11/06 0827) Temp src: Oral (11/06 0827) BP: 144/70 mmHg (11/06 1021) Pulse Rate: 84  (11/06 1021)  Labs:  [facility] 08/24/12 0853  HGB 13.3  HCT 39.0  PLT 175  APTT --  LABPROT --  INR --  HEPARINUNFRC --  CREATININE 0.80  CKTOTAL --  CKMB --  TROPONINI 0.62*    Estimated Creatinine Clearance: 138.1 ml/min (by C-G formula based on Cr of 0.8).   Medical History: Past Medical History  Diagnosis Date  . Hypertension     Medications:   (Not in a hospital admission)  Assessment: 51 y/o male patient admitted with GERD like symptoms, troponin positive, requiring anticoagulation for r/o MI.  Goal of Therapy:  Heparin level 0.3-0.7 units/ml Monitor platelets by anticoagulation protocol: Yes   Plan:  Heparin 4000 unit IV bolus followed by infusion at 1400 units/hr. Check 6 hour heparin level with daily cbc and heparin level.  Verlene Mayer, PharmD, BCPS Pager [phone] 08/24/2012,10:23 AM

## 2012-08-24 NOTE — CV Procedure (Signed)
    Cardiac Catheterization Operative Report  Ather Rocker [id number] 11/6/20131:57 PM No primary provider on file.  Procedure Performed:  1. Left Heart Catheterization 2. Selective Coronary Angiography 3. Left ventricular angiogram  Operator: Verne Carrow, MD  Arterial access site:  Right radial artery.   Indication:  51 yo AAM with history of tobacco abuse,  COPD, ongoing tobacco abuse, cardiomyopathy admitted with chest pain, slight elevation of troponin. Evaluated in ED by Dr. Gala Romney                              Procedure Details: The risks, benefits, complications, treatment options, and expected outcomes were discussed with the patient. The patient and/or family concurred with the proposed plan, giving informed consent. The patient was brought to the cath lab after IV hydration was begun and oral premedication was given. The patient was further sedated with Versed and Fentanyl. The right wrist was assessed with an Allens test which was positive. The right wrist was prepped and draped in a sterile fashion. 1% lidocaine was used for local anesthesia. Using the modified Seldinger access technique, a 5 French sheath was placed in the right radial artery. 3 mg Verapamil was given through the sheath. 4000 units IV heparin was given. Standard diagnostic catheters were used to perform selective coronary angiography. A pigtail catheter was used to perform a left ventricular angiogram.   The sheath was removed from the right radial artery and a Terumo hemostasis band was applied at the arteriotomy site on the right wrist.   There were no immediate complications. The patient was taken to the recovery area in stable condition.   Hemodynamic Findings: Central aortic pressure: 129/77 Left ventricular pressure: 122/13/19  Angiographic Findings:  Left main: 20% distal stenosis.   Left Anterior Descending Artery: Large caliber vessel that courses to the apex. The ostium appears to  have 20-30% stenosis. The mid vessel has diffuse 10-20% stenosis. There are two small to moderate caliber diagonal branches without focal stenosis.   Circumflex Artery: Large caliber vessel that terminates into a moderate sized obtuse marginal branch. There is mild plaque in the proximal vessel and in the marginal branch.  Right Coronary Artery: Moderate caliber, dominant vessel with 40% mid stenosis just after a small RV marginal branch. Multiple views were taken and this does not appear to be flow limiting.   Left Ventricular Angiogram: LVEF 45% with severe hypokinesis of the antero-apex and infero-apex.   Impression: 1. Mild to moderate non-obstructive CAD 2. Segmental LV systolic dysfunction 3. 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   Recommendations: Will admit to CCU. Will treat with IV PPI. Will follow cardiac markers. Will continue ASA, beta blocker, statin.        Complications:  None. The patient tolerated the procedure well.

## 2012-08-24 NOTE — ED Notes (Signed)
Transported to Cath Lab .  

## 2012-08-24 NOTE — ED Notes (Signed)
Dr. Manus Gunning informed of Troponin level

## 2012-08-24 NOTE — ED Provider Notes (Signed)
History     CSN: [id number]  Arrival date & time 08/24/12  2440   First MD Initiated Contact with Patient 08/24/12 [id number]      Chief Complaint  Patient presents with  . Gastrophageal Reflux    (Consider location/radiation/quality/duration/timing/severity/associated sxs/prior treatment) HPI Comments: Patient is a 51 year old with a past medical history of GERD and HTN who presents with chest pain that started about 2 weeks ago that has been waxing and waning since the onset. Patient reports having intervals of "slow building" of "searing" chest pain that is located substernally and does not radiate. The pain is severe and lasts about an hour before spontaneously resolving without intervention. The pain has no pattern and is not triggered by eating or exertion. Patient reports being admitted to the hospital one week ago for chest pain which was determined to be acid reflux. Patient has taking omeprazole and maalox as directed without any relief. The patient denies associated nausea, dizziness, and diaphoresis. No aggravating/allevaiting factors. Patient denies fever, headache, visual changes, vomiting, diarrhea, abdominal pain, numbness/tingling. Patient is currently asymptomatic.     Past Medical History  Diagnosis Date  . Hypertension     Past Surgical History  Procedure Date  . Tonsillectomy     Family History  Problem Relation Age of Onset  . Hypertension Father   . Cancer - Other Father     SCCa of Oropharynx    History  Substance Use Topics  . Smoking status: Current Every Day Smoker    Types: Cigarettes  . Smokeless tobacco: Not on file  . Alcohol Use: No      Review of Systems  Cardiovascular: Positive for chest pain.  All other systems reviewed and are negative.    Allergies  Review of patient's allergies indicates no known allergies.  Home Medications   Current Outpatient Rx  Name  Route  Sig  Dispense  Refill  . ALUM & MAG HYDROXIDE-SIMETH 200-200-20  MG/5ML PO SUSP   Oral   Take 15 mLs by mouth every 6 (six) hours as needed. For acid relux         . CARVEDILOL 6.25 MG PO TABS   Oral   Take 1 tablet (6.25 mg total) by mouth 2 (two) times daily with a meal.   60 tablet   0   . LOSARTAN POTASSIUM-HCTZ 50-12.5 MG PO TABS   Oral   Take 1 tablet by mouth daily.   30 tablet   1   . OMEPRAZOLE 20 MG PO CPDR   Oral   Take 20 mg by mouth daily.           BP 143/66  Pulse 86  Temp 97.4 F (36.3 C) (Oral)  Resp 14  SpO2 98%  Physical Exam  Nursing note and vitals reviewed. Constitutional: He is oriented to person, place, and time. He appears well-developed and well-nourished. No distress.  HENT:  Head: Normocephalic and atraumatic.  Mouth/Throat: Oropharynx is clear and moist. No oropharyngeal exudate.  Eyes: Conjunctivae normal and EOM are normal. Pupils are equal, round, and reactive to light.  Neck: Normal range of motion. Neck supple.       No carotid bruits or JVD noted.  Cardiovascular: Normal rate and regular rhythm.  Exam reveals no gallop and no friction rub.   No murmur heard. Pulmonary/Chest: Effort normal and breath sounds normal. He has no wheezes. He has no rales. He exhibits no tenderness.  Abdominal: Soft. He exhibits no distension. There  is no tenderness. There is no rebound and no guarding.  Musculoskeletal: Normal range of motion.  Neurological: He is alert and oriented to person, place, and time. Coordination normal.       Speech is goal-oriented. Moves limbs without ataxia.   Skin: Skin is warm and dry. He is not diaphoretic.  Psychiatric: He has a normal mood and affect. His behavior is normal.    ED Course  Procedures (including critical care time)   Date: 08/24/2012  Rate: 98  Rhythm: normal sinus rhythm  QRS Axis: normal  Intervals: PR prolonged  ST/T Wave abnormalities: normal  Conduction Disutrbances:first-degree A-V block   Narrative Interpretation: NSR unchanged from previous on  08/12/2012  Old EKG Reviewed: unchanged    Labs Reviewed  CBC WITH DIFFERENTIAL - Abnormal; Notable for the following:    Monocytes Relative 15 (*)     Monocytes Absolute 1.1 (*)     All other components within normal limits  COMPREHENSIVE METABOLIC PANEL - Abnormal; Notable for the following:    Glucose, Bld 125 (*)     Albumin 3.4 (*)     ALT 83 (*)     Alkaline Phosphatase 125 (*)     All other components within normal limits  TROPONIN I - Abnormal; Notable for the following:    Troponin I 0.62 (*)     All other components within normal limits  PRO B NATRIURETIC PEPTIDE - Abnormal; Notable for the following:    Pro B Natriuretic peptide (BNP) 126.2 (*)     All other components within normal limits  HEPARIN LEVEL (UNFRACTIONATED)   Dg Chest 2 View  08/24/2012  *RADIOLOGY REPORT*  Clinical Data: Chest pain, smoking history  CHEST - 2 VIEW  Comparison: Chest x-ray of 08/12/2012 and CT angio chest of the same date.  Findings: No active infiltrate or effusion is seen.  There is peribronchial thickening consistent with bronchitis, possibly chronic.  Mediastinal contours are stable.  The heart is within normal limits in size.  No acute bony abnormality is seen.  IMPRESSION: Peribronchial thickening consistent with bronchitis.  No pneumonia or effusion.   Original Report Authenticated By: Dwyane Dee, M.D.      1. Non-STEMI (non-ST elevated myocardial infarction)       MDM  10:07 AM Patient will have GI cocktail as ordered earlier. EKG unremarkable. Will consult [place] for admission for NSTEMI. Patient will be started on aspirin and heparin drip.         Emilia Beck, PA-C 08/24/12 [address] PA-C 08/24/12 1038

## 2012-08-24 NOTE — ED Notes (Signed)
Pt placed on zoll pads 

## 2012-08-25 DIAGNOSIS — I214 Non-ST elevation (NSTEMI) myocardial infarction: Secondary | ICD-10-CM

## 2012-08-25 DIAGNOSIS — R072 Precordial pain: Secondary | ICD-10-CM

## 2012-08-25 DIAGNOSIS — R079 Chest pain, unspecified: Secondary | ICD-10-CM

## 2012-08-25 DIAGNOSIS — E0590 Thyrotoxicosis, unspecified without thyrotoxic crisis or storm: Secondary | ICD-10-CM

## 2012-08-25 LAB — CBC
HCT: 40.4 % (ref 39.0–52.0)
Hemoglobin: 13.4 g/dL (ref 13.0–17.0)
MCH: 28.5 pg (ref 26.0–34.0)
MCHC: 33.2 g/dL (ref 30.0–36.0)
MCV: 86 fL (ref 78.0–100.0)
Platelets: 186 10*3/uL (ref 150–400)
RBC: 4.7 MIL/uL (ref 4.22–5.81)
RDW: 12.5 % (ref 11.5–15.5)
WBC: 5.9 10*3/uL (ref 4.0–10.5)

## 2012-08-25 LAB — BASIC METABOLIC PANEL
BUN: 13 mg/dL (ref 6–23)
Calcium: 10 mg/dL (ref 8.4–10.5)
GFR calc Af Amer: >90 mL/min (ref >90)
GFR calc non Af Amer: >90 mL/min (ref >90)
Potassium: 3.7 mEq/L (ref 3.5–5.1)
Sodium: 139 mEq/L (ref 135–145)

## 2012-08-25 LAB — T4, FREE: Free T4: 5.5 ng/dL — ABNORMAL HIGH (ref 0.80–1.80)

## 2012-08-25 LAB — SEDIMENTATION RATE: Sed Rate: 37 mm/hr — ABNORMAL HIGH (ref 0–16)

## 2012-08-25 LAB — LIPID PANEL: HDL: 29 mg/dL — ABNORMAL LOW (ref >39)

## 2012-08-25 LAB — TSH: TSH: <0.008 u[IU]/mL — ABNORMAL LOW (ref 0.350–4.500)

## 2012-08-25 LAB — HEMOGLOBIN A1C: Hgb A1c MFr Bld: 5.9 % — ABNORMAL HIGH (ref <5.7)

## 2012-08-25 MED ORDER — PANTOPRAZOLE SODIUM 40 MG PO TBEC
40.0000 mg | DELAYED_RELEASE_TABLET | Freq: Two times a day (BID) | ORAL | Status: DC
  Administered 2012-08-25 – 2012-08-28 (×7): 40 mg via ORAL
  Filled 2012-08-25 (×7): qty 1

## 2012-08-25 MED ORDER — METHIMAZOLE 10 MG PO TABS
60.0000 mg | ORAL_TABLET | Freq: Every day | ORAL | Status: DC
  Administered 2012-08-25 – 2012-08-28 (×3): 60 mg via ORAL
  Filled 2012-08-25 (×4): qty 6

## 2012-08-25 MED ORDER — METHIMAZOLE 5 MG PO TABS
5.0000 mg | ORAL_TABLET | Freq: Three times a day (TID) | ORAL | Status: DC
  Filled 2012-08-25 (×4): qty 1

## 2012-08-25 MED ORDER — SODIUM CHLORIDE 0.45 % IV SOLN: INTRAVENOUS | Status: DC

## 2012-08-25 MED ORDER — HEPARIN SODIUM (PORCINE) 5000 UNIT/ML IJ SOLN
5000.0000 [IU] | Freq: Three times a day (TID) | INTRAMUSCULAR | Status: DC
  Administered 2012-08-25 – 2012-08-28 (×10): 5000 [IU] via SUBCUTANEOUS
  Filled 2012-08-25 (×13): qty 1

## 2012-08-25 MED ORDER — CARVEDILOL 12.5 MG PO TABS
12.5000 mg | ORAL_TABLET | Freq: Two times a day (BID) | ORAL | Status: DC
  Administered 2012-08-25 – 2012-08-28 (×6): 12.5 mg via ORAL
  Filled 2012-08-25 (×9): qty 1

## 2012-08-25 MED ORDER — ALUM & MAG HYDROXIDE-SIMETH 200-200-20 MG/5ML PO SUSP
30.0000 mL | ORAL | Status: DC | PRN
Start: 2012-08-25 — End: 2012-08-27
  Administered 2012-08-25: 30 mL via ORAL
  Filled 2012-08-25: qty 30

## 2012-08-25 MED ORDER — MAGNESIUM OXIDE 400 (241.3 MG) MG PO TABS
200.0000 mg | ORAL_TABLET | Freq: Two times a day (BID) | ORAL | Status: AC
  Administered 2012-08-25 (×2): 200 mg via ORAL
  Filled 2012-08-25 (×2): qty 0.5

## 2012-08-25 MED ORDER — GI COCKTAIL ~~LOC~~
30.0000 mL | Freq: Three times a day (TID) | ORAL | Status: DC | PRN
  Administered 2012-08-25 – 2012-08-26 (×2): 30 mL via ORAL
  Filled 2012-08-25 (×2): qty 30

## 2012-08-25 MED ORDER — GI COCKTAIL ~~LOC~~
30.0000 mL | ORAL | Status: AC
  Administered 2012-08-25: 30 mL via ORAL
  Filled 2012-08-25: qty 30

## 2012-08-25 MED ORDER — SUCRALFATE 1 G PO TABS
1.0000 g | ORAL_TABLET | Freq: Three times a day (TID) | ORAL | Status: DC
  Administered 2012-08-25 – 2012-08-28 (×11): 1 g via ORAL
  Filled 2012-08-25 (×18): qty 1

## 2012-08-25 NOTE — Consult Note (Signed)
Reason for Consult: hyperthyroidism Referring Physician: Dr. Romeo Apple Alejandro Reyes is an 51 y.o. male.   HPI: For the past 1 month, [name] has experienced episodes of "really, intense" chest pain.  Associated weight loss of 30 pounds within the past 3 weeks and insomnia for the past 1 month--without apparent contributing factor or precipitating factor.  No previous diagnosis of any thyroid condition. No family history of thyroid disorders. No iodinated contrast exposure to his knowledge until the CT scan done during this hospital stay.  No use of thyroid hormone by prescription or by supplement.  Past Medical History  Diagnosis Date  . Hypertension   . Chronic systolic CHF (congestive heart failure), NYHA class 1   . Tobacco abuse   . COPD (chronic obstructive pulmonary disease)   . GERD (gastroesophageal reflux disease)   . Alcohol abuse, in remission     Past Surgical History  Procedure Date  . Tonsillectomy     Family History  Problem Relation Age of Onset  . Hypertension Father   . Cancer - Other Father     SCCa of Oropharynx    Social History:  reports that he has been smoking Cigarettes.  He does not have any smokeless tobacco history on file. He reports that he does not drink alcohol or use illicit drugs.  He works a third shift job    Allergies: No Known Allergies  Medications:  I have reviewed the patient's current medications. Scheduled:   . aspirin EC  81 mg Oral Daily  . atorvastatin  40 mg Oral q1800  . carvedilol  12.5 mg Oral BID WC  . [COMPLETED] gi cocktail  30 mL Oral To Cath  . [COMPLETED] gi cocktail  30 mL Oral STAT  . heparin subcutaneous  5,000 Units Subcutaneous Q8H  . lisinopril  10 mg Oral Daily  . magnesium oxide  200 mg Oral BID  . methimazole  5 mg Oral Q8H  . pantoprazole  40 mg Oral BID AC  . sodium chloride  3 mL Intravenous Q12H  . sucralfate  1 g Oral TID WC & HS  . [COMPLETED] aspirin  324 mg Oral NOW  . [DISCONTINUED]  aspirin  81 mg Oral Daily  . [COMPLETED] aspirin  300 mg Rectal NOW  . [DISCONTINUED] atorvastatin  40 mg Oral q1800  . [DISCONTINUED] carvedilol  6.25 mg Oral BID WC  . [DISCONTINUED] carvedilol  6.25 mg Oral BID WC  . [DISCONTINUED] diazepam  2 mg Oral On Call  . [DISCONTINUED] pantoprazole  40 mg Oral Daily  . [DISCONTINUED] pantoprazole  40 mg Oral Daily  . [DISCONTINUED] sodium chloride  3 mL Intravenous Q12H   Continuous:   . [EXPIRED] sodium chloride    . [DISCONTINUED] sodium chloride     YNW:GNFAOZ chloride, acetaminophen, alum & mag hydroxide-simeth, gi cocktail, morphine injection, nitroGLYCERIN, ondansetron (ZOFRAN) IV, oxyCODONE-acetaminophen, sodium chloride, [DISCONTINUED] sodium chloride, [DISCONTINUED] acetaminophen, [DISCONTINUED] ondansetron (ZOFRAN) IV, [DISCONTINUED] sodium chloride  ROS General: No weight gain, no fever. Endocrine:  No cold intolerance, but heat intolerance. Eyes:  No eye redness, no bulging of eyes. Neck:  No hoarseness, no difficulty swallowing. Cardiovascular: Occasional palpitations, no edema. Respiratory: No dyspnea, no cough. Gastrointestinal: No diarrhea, no constipation. Neurologic: No tremor, no confusion. Skin: No rash, no jaundice. Hematologic/lymphatic: No abnormal bleeding, no cervical adenopathy.  Blood pressure 103/58, pulse 101, temperature 98.6 F (37 C), temperature source Oral, resp. rate 18, height 6' 2.02" (1.88 m), weight 102.105 kg (225  lb 1.6 oz), SpO2 97.00%. Physical Exam General: No apparent distress. Eyes: Anicteric, no scleral show, minimal periorbital puffiness, no lid lag. Neck: Supple, trachea midline. Thyroid: Partially substernal which limits the thyroid exam, no audible thyroid bruit, no palpable thyroid nodule, exam seems consistent with symmetric thyromegaly. Cardiovascular: Regular rhythm but mildly rapid rate, normal radial pulses. Respiratory: Normal respiratory effort, clear to  auscultation. Gastrointestinal: Normal pitch active bowel sounds, nontender abdomen without distention or appreciable hepatomegaly. Neurologic: Cranial nerves normal as tested, deep tendon reflexes 2+, minimal tremor in fingers/hands. Musculoskeletal: Normal muscle tone, no muscle atrophy. Skin: Mildly excessive warmth, no visible rash. Mental status: Alert, conversant, speech clear, thought logical, appropriate mood and affect, no hallucinations or delusions evident. Hematologic/lymphatic: No cervical adenopathy, no jaundice.   Results for orders placed during the hospital encounter of 08/24/12 (from the past 48 hour(s))  CBC WITH DIFFERENTIAL     Status: Abnormal   Collection Time   08/24/12  8:53 AM      Component Value Range Comment   WBC 7.0  4.0 - 10.5 K/uL    RBC 4.58  4.22 - 5.81 MIL/uL    Hemoglobin 13.3  13.0 - 17.0 g/dL    HCT 30.8  65.7 - 84.6 %    MCV 85.2  78.0 - 100.0 fL    MCH 29.0  26.0 - 34.0 pg    MCHC 34.1  30.0 - 36.0 g/dL    RDW 96.2  95.2 - 84.1 %    Platelets 175  150 - 400 K/uL    Neutrophils Relative 44  43 - 77 %    Neutro Abs 3.1  1.7 - 7.7 K/uL    Lymphocytes Relative 38  12 - 46 %    Lymphs Abs 2.7  0.7 - 4.0 K/uL    Monocytes Relative 15 (*) 3 - 12 %    Monocytes Absolute 1.1 (*) 0.1 - 1.0 K/uL    Eosinophils Relative 2  0 - 5 %    Eosinophils Absolute 0.2  0.0 - 0.7 K/uL    Basophils Relative 0  0 - 1 %    Basophils Absolute 0.0  0.0 - 0.1 K/uL   COMPREHENSIVE METABOLIC PANEL     Status: Abnormal   Collection Time   08/24/12  8:53 AM      Component Value Range Comment   Sodium 140  135 - 145 mEq/L    Potassium 3.9  3.5 - 5.1 mEq/L    Chloride 101  96 - 112 mEq/L    CO2 31  19 - 32 mEq/L    Glucose, Bld 125 (*) 70 - 99 mg/dL    BUN 12  6 - 23 mg/dL    Creatinine, Ser 3.24  0.50 - 1.35 mg/dL    Calcium 40.1  8.4 - 10.5 mg/dL    Total Protein 7.1  6.0 - 8.3 g/dL    Albumin 3.4 (*) 3.5 - 5.2 g/dL    AST 31  0 - 37 U/L    ALT 83 (*) 0 - 53 U/L     Alkaline Phosphatase 125 (*) 39 - 117 U/L    Total Bilirubin 0.3  0.3 - 1.2 mg/dL    GFR calc non Af Amer >90  >90 mL/min    GFR calc Af Amer >90  >90 mL/min   TROPONIN I     Status: Abnormal   Collection Time   08/24/12  8:53 AM      Component Value  Range Comment   Troponin I 0.62 (*) <0.30 ng/mL   PRO B NATRIURETIC PEPTIDE     Status: Abnormal   Collection Time   08/24/12  8:53 AM      Component Value Range Comment   Pro B Natriuretic peptide (BNP) 126.2 (*) 0 - 125 pg/mL   PROTIME-INR     Status: Normal   Collection Time   08/24/12 11:41 AM      Component Value Range Comment   Prothrombin Time 13.4  11.6 - 15.2 seconds    INR 1.03  0.00 - 1.49   TROPONIN I     Status: Abnormal   Collection Time   08/24/12  4:10 PM      Component Value Range Comment   Troponin I 0.53 (*) <0.30 ng/mL   TSH     Status: Abnormal   Collection Time   08/24/12  4:10 PM      Component Value Range Comment   TSH <0.008 (*) 0.350 - 4.500 uIU/mL   MAGNESIUM     Status: Normal   Collection Time   08/24/12  4:10 PM      Component Value Range Comment   Magnesium 1.7  1.5 - 2.5 mg/dL   HEMOGLOBIN R6E     Status: Abnormal   Collection Time   08/24/12  4:10 PM      Component Value Range Comment   Hemoglobin A1C 5.9 (*) <5.7 %    Mean Plasma Glucose 123 (*) <117 mg/dL   T4, FREE     Status: Abnormal   Collection Time   08/24/12  4:10 PM      Component Value Range Comment   Free T4 5.50 (*) 0.80 - 1.80 ng/dL   TROPONIN I     Status: Abnormal   Collection Time   08/24/12  9:44 PM      Component Value Range Comment   Troponin I 0.75 (*) <0.30 ng/mL   TROPONIN I     Status: Abnormal   Collection Time   08/25/12  3:18 AM      Component Value Range Comment   Troponin I 0.52 (*) <0.30 ng/mL   CBC     Status: Normal   Collection Time   08/25/12  3:18 AM      Component Value Range Comment   WBC 5.9  4.0 - 10.5 K/uL    RBC 4.70  4.22 - 5.81 MIL/uL    Hemoglobin 13.4  13.0 - 17.0 g/dL    HCT 45.4  09.8 -  11.9 %    MCV 86.0  78.0 - 100.0 fL    MCH 28.5  26.0 - 34.0 pg    MCHC 33.2  30.0 - 36.0 g/dL    RDW 14.7  82.9 - 56.2 %    Platelets 186  150 - 400 K/uL   BASIC METABOLIC PANEL     Status: Abnormal   Collection Time   08/25/12  3:18 AM      Component Value Range Comment   Sodium 139  135 - 145 mEq/L    Potassium 3.7  3.5 - 5.1 mEq/L    Chloride 102  96 - 112 mEq/L    CO2 28  19 - 32 mEq/L    Glucose, Bld 119 (*) 70 - 99 mg/dL    BUN 13  6 - 23 mg/dL    Creatinine, Ser 1.30  0.50 - 1.35 mg/dL    Calcium 86.5  8.4 - 10.5 mg/dL  GFR calc non Af Amer >90  >90 mL/min    GFR calc Af Amer >90  >90 mL/min   LIPID PANEL     Status: Abnormal   Collection Time   08/25/12  3:18 AM      Component Value Range Comment   Cholesterol 152  0 - 200 mg/dL    Triglycerides 79  <409 mg/dL    HDL 29 (*) >81 mg/dL    Total CHOL/HDL Ratio 5.2      VLDL 16  0 - 40 mg/dL    LDL Cholesterol 191 (*) 0 - 99 mg/dL   SEDIMENTATION RATE     Status: Abnormal   Collection Time   08/25/12  3:18 AM      Component Value Range Comment   Sed Rate 37 (*) 0 - 16 mm/hr     Dg Chest 2 View  08/24/2012  *RADIOLOGY REPORT*  Clinical Data: Chest pain, smoking history  CHEST - 2 VIEW  Comparison: Chest x-ray of 08/12/2012 and CT angio chest of the same date.  Findings: No active infiltrate or effusion is seen.  There is peribronchial thickening consistent with bronchitis, possibly chronic.  Mediastinal contours are stable.  The heart is within normal limits in size.  No acute bony abnormality is seen.  IMPRESSION: Peribronchial thickening consistent with bronchitis.  No pneumonia or effusion.   Original Report Authenticated By: Dwyane Dee, M.D.    Assessment/Plan: 1.  Hyperthyroidism.  I agree with the thyroid-stimulating immunoglobulin test since this may reveal Graves' disease as the underlying cause.  With his severe hyperthyroidism and recent iodinated contrast exposure, I recommend increasing methimazole to 60 mg  per day (for example: 10 mg tablets 6 tablets by mouth once a day). Though methimazole has a relatively short plasma half-life, it has a relatively long intrathyroidal half-life.  It may be given once a day.    Due to his iodinated contrast exposure and myocardial infarction, radioactive iodine therapy and elective thyroidectomy are not options for treatment at least for another 2 months.    I discussed the care plan with Dr. Dede Query, Internal Medicine resident with the cardiology service this month.  Informed consent obtained after discussion of anticipated benefits, alternatives, nature of therapy, and risks--including but not limited to rash, allergic reaction, bone marrow suppression, liver damage, etc. from methimazole.  If fever, jaundice, generalized pruritis, sore throat, rash, or other worrisome symptoms develop, the patient will stop taking methimazole and call me.   Please have the patient or nursing staff call [phone] to schedule the following visits at [facility], [address], [employer], [address], [place], [place] [address]: 1.  Lab visit in one week for CBC with differential and hepatic function panel. 2.  Lab visit in 3 weeks for TSH and free T4 level. 3.  Office visit with me (Dr. Sharl Ma) in 3 or 4 weeks.   Sheila Gervasi 08/25/2012, 1:51 PM

## 2012-08-25 NOTE — Care Management Note (Signed)
    Page 1 of 1   08/25/2012     9:38:43 AM   CARE MANAGEMENT NOTE 08/25/2012  Patient:  Alejandro Reyes, Alejandro Reyes   Account Number:  [id number]  Date Initiated:  08/25/2012  Documentation initiated by:  Junius Creamer  Subjective/Objective Assessment:   adm w mi     Action/Plan:   lives alone   Anticipated DC Date:     Anticipated DC Plan:        DC Planning Services  CM consult      Choice offered to / List presented to:             Status of service:   Medicare Important Message given?   (If response is "NO", the following Medicare IM given date fields will be blank) Date Medicare IM given:   Date Additional Medicare IM given:    Discharge Disposition:    Per UR Regulation:  Reviewed for med. necessity/level of care/duration of stay  If discussed at Long Length of Stay Meetings, dates discussed:    Comments:  11/7 9:38a debbie Iyad Deroo rn,bsn [phone]

## 2012-08-25 NOTE — Progress Notes (Signed)
Inpatient Diabetes Program Recommendations  AACE/ADA: New Consensus Statement on Inpatient Glycemic Control (2013)  Target Ranges:  Prepandial:   less than 140 mg/dL      Peak postprandial:   less than 180 mg/dL (1-2 hours)      Critically ill patients:  140 - 180 mg/dL   Reason for Visit: Referral received due to A1C=5.9% indicating pre-diabetes.  Gave patient [facility] Nursing Consult note titled "Am I at Risk for Diabetes".  Briefly discussed ways to prevent diabetes including exercise, lifestyle modifications, etc.  Patient was appreciative of information and able to verbalize understanding.

## 2012-08-25 NOTE — Progress Notes (Signed)
Patient: Alejandro Reyes Date of Encounter: 08/25/2012, 7:01 AM Admit date: 08/24/2012     Subjective  Patient had two episodes of severe chest pain episodes overnight. His chest pain resolved with Morphine, Oxycodone and Maalox. EKG unremarkable. Minimum elevation of Troponin.   Of note, patient states that he has had unintentional 30 lbs weight loss last month.  Objective  Physical Exam: Vitals: BP 122/61  Pulse 80  Temp 98.1 F (36.7 C) (Oral)  Resp 16  Ht 6' 2.02" (1.88 m)  Wt 225 lb 1.6 oz (102.105 kg)  BMI 28.89 kg/m2  SpO2 98% General: Well developed, well appearing, in no acute distress. Stresses out  HEENT: Normocephalic, atraumatic. EOMs intact. Sclera nonicteric. Oropharynx clear.  Neck: Supple. Positive carotid bruits L>R. No JVD.  thyroid enlargement noted,  No thyroid nodule or bruit appreciated   Lungs: Respirations regular and unlabored, CTA bilaterally.  Heart: RRR. S1, S2 present. +s4 2/6 SEM at right 2nd intercostal space and left sternal boarder.  Abdomen: Soft, non-tender, non-distended. BS present x 4 quadrants. No hepatosplenomegaly.  Extremities: No clubbing, cyanosis or edema. DP/PT/Radials 2+ and equal bilaterally.  Psych: Normal affect.  Neuro: Alert and oriented X 3. Moves all extremities spontaneously.  Musculoskeletal: No kyphosis.  Skin: Intact. Warm and dry. No rashes or petechiae in exposed areas   Intake/Output:  Intake/Output Summary (Last 24 hours) at 08/25/12 0701 Last data filed at 08/25/12 0050  Gross per 24 hour  Intake    735 ml  Output    850 ml  Net   -115 ml    Inpatient Medications:     . [COMPLETED] aspirin  324 mg Oral Once  . aspirin EC  81 mg Oral Daily  . atorvastatin  40 mg Oral q1800  . carvedilol  6.25 mg Oral BID WC  . [COMPLETED] fentaNYL      . [COMPLETED] gi cocktail  30 mL Oral Once  . [COMPLETED] gi cocktail  30 mL Oral To Cath  . [COMPLETED] heparin      . [COMPLETED] heparin      . [COMPLETED]  heparin  1,400 Units/hr Intravenous Once  . [COMPLETED] heparin  4,000 Units Intravenous Once  . [COMPLETED] lidocaine      . lisinopril  10 mg Oral Daily  . [COMPLETED] metoprolol      . [COMPLETED] midazolam      . [COMPLETED] nitroGLYCERIN      . [COMPLETED] nitroGLYCERIN  2-200 mcg/min Intravenous Once  . pantoprazole  40 mg Oral Daily  . sodium chloride  3 mL Intravenous Q12H  . [COMPLETED] verapamil      . [COMPLETED] aspirin  324 mg Oral NOW  . [DISCONTINUED] aspirin  81 mg Oral Daily  . [COMPLETED] aspirin  300 mg Rectal NOW  . [DISCONTINUED] atorvastatin  40 mg Oral q1800  . [DISCONTINUED] carvedilol  6.25 mg Oral BID WC  . [DISCONTINUED] diazepam  2 mg Oral On Call  . [DISCONTINUED] pantoprazole  40 mg Oral Daily  . [DISCONTINUED] sodium chloride  3 mL Intravenous Q12H      . [EXPIRED] sodium chloride    . [DISCONTINUED] sodium chloride      Labs:  [facility] 08/25/12 0318 08/24/12 1610 08/24/12 0853  NA 139 -- 140  K 3.7 -- 3.9  CL 102 -- 101  CO2 28 -- 31  GLUCOSE 119* -- 125*  BUN 13 -- 12  CREATININE 0.86 -- 0.80  CALCIUM 10.0 -- 10.3  MG --  1.7 --  PHOS -- -- --    Basename 08/24/12 0853  AST 31  ALT 83*  ALKPHOS 125*  BILITOT 0.3  PROT 7.1  ALBUMIN 3.4*   No results found for this basename: LIPASE:2,AMYLASE:2 in the last 72 hours  Basename 08/25/12 0318 08/24/12 0853  WBC 5.9 7.0  NEUTROABS -- 3.1  HGB 13.4 13.3  HCT 40.4 39.0  MCV 86.0 85.2  PLT 186 175    Basename 08/25/12 0318 08/24/12 2144 08/24/12 1610 08/24/12 0853  CKTOTAL -- -- -- --  CKMB -- -- -- --  TROPONINI 0.52* 0.75* 0.53* 0.62*   No components found with this basename: POCBNP:3 No results found for this basename: DDIMER in the last 72 hours  Basename 08/24/12 1610  HGBA1C 5.9*    Basename 08/25/12 0318  CHOL 152  HDL 29*  LDLCALC 107*  TRIG 79  CHOLHDL 5.2    Basename 08/24/12 1610  TSH <0.008*  T4TOTAL --  T3FREE --  THYROIDAB --   No results found  for this basename: VITAMINB12,FOLATE,FERRITIN,TIBC,IRON,RETICCTPCT in the last 72 hours  Radiology/Studies: Dg Chest 2 View  08/24/2012  *RADIOLOGY REPORT*  Clinical Data: Chest pain, smoking history  CHEST - 2 VIEW  Comparison: Chest x-ray of 08/12/2012 and CT angio chest of the same date.  Findings: No active infiltrate or effusion is seen.  There is peribronchial thickening consistent with bronchitis, possibly chronic.  Mediastinal contours are stable.  The heart is within normal limits in size.  No acute bony abnormality is seen.  IMPRESSION: Peribronchial thickening consistent with bronchitis.  No pneumonia or effusion.   Original Report Authenticated By: Dwyane Dee, M.D.    Dg Chest 2 View  08/12/2012  *RADIOLOGY REPORT*  Clinical Data: Chest pain  CHEST - 2 VIEW  Comparison: 08/04/2012  Findings: Lungs are essentially clear.  No pleural effusion or pneumothorax.  Apparent mild patchy left midlung opacity likely reflects normal pulmonary markings, as it is unchanged from multiple prior studies and without correlate on prior CT.  Cardiomediastinal silhouette is within normal limits.  Mild degenerative changes of the visualized thoracolumbar spine.  IMPRESSION: No evidence of acute cardiopulmonary disease.   Original Report Authenticated By: Charline Bills, M.D.    Dg Chest 2 View  08/04/2012  *RADIOLOGY REPORT*  Clinical Data: Chest pain.  CHEST - 2 VIEW  Comparison: 11/18/2009  Findings: Previously seen opacity in the central right upper lobe is no longer visualized.  No evidence of acute infiltrate or edema. No evidence of pleural effusion.  Heart size is normal.  There is no hilar or mediastinal lymphadenopathy identified.  IMPRESSION: No active disease.   Original Report Authenticated By: Danae Orleans, M.D.    Nm Myocar Multi W/spect W/wall Motion / Ef  08/14/2012  *RADIOLOGY REPORT*  Clinical Data:  51 year old male with chest pain and hypertension.  MYOCARDIAL IMAGING WITH SPECT (REST  AND PHARMACOLOGIC-STRESS) GATED LEFT VENTRICULAR WALL MOTION STUDY LEFT VENTRICULAR EJECTION FRACTION  Technique:  Resting myocardial SPECT imaging was initially performed after intravenous administration of radiopharmaceutical. Myocardial SPECT was subsequently performed after additional radiopharmaceutical injection during pharmacologic-stress supervised by the Cardiology staff.  Quantitative gated imaging was also performed to evaluate left ventricular wall motion, and estimate left ventricular ejection fraction.  Radiopharmaceutical:  Tc-16m Myoview 10 mCi at rest and during stress.  Comparison: none  Findings:  Technique: Study is adequate.  Perfusion:  There are no relative decreased counts on stress or rest to suggest reversible ischemia or  infarction.  Wall motion: Septal hypokinesia.  Left ventricular ejection fraction:  Calculated left ventricular ejection fraction =  44%  IMPRESSION:  1.  No reversible ischemia or infarction.  2. Septal hypokinesia  3.  Left ventricular ejection fraction equal 44%   Original Report Authenticated By: Genevive Bi, M.D.    Ct Angio Chest Aortic Dissect W &/or W/o  08/13/2012  *RADIOLOGY REPORT*  Clinical Data:  2 weeks of sternal chest pain shooting to the back and neck.  CT ANGIOGRAPHY CHEST  Technique:  Multidetector CT imaging through the chest, abdomen and pelvis was performed using the standard protocol during bolus administration of intravenous contrast.  Multiplanar reconstructed images including MIPs were obtained and reviewed to evaluate the vascular anatomy.  Contrast: OMNIPAQUE IOHEXOL 350 MG/ML SOLN  Comparison:   10/29/2009 CT chest.  CTA CHEST  Findings:  Unenhanced images demonstrate normal caliber of the thoracic aorta without significant aortic calcification.  No significant coronary artery calcification.  Incidental note of a 19 mm nodule in the left adrenal gland containing fat density consistent with a benign lipoma.  Contrast enhanced  images demonstrate stable appearance of soft tissue in the anterior mediastinum which may represent adenopathy or thymic tissue.  Additional mediastinal lymph nodes are not pathologically enlarged.  Normal caliber thoracic aorta.  No evidence of dissection.  Motion artifact in the ascending thoracic aorta.  No pleural effusions.  No pneumothorax.  There is scarring and infiltration centrally in the right middle lung and right upper lung which is improving since the previous study.  Airways appear patent.  Peribronchial thickening consistent with chronic bronchitis.  Minimal emphysematous changes in the apices.  No acute consolidation.  Diffuse prominence of the thyroid gland without focal nodularity.  Normal alignment of the thoracic spine with mild degenerative change.   Review of the MIP images confirms the above findings.  IMPRESSION: No evidence of aortic dissection.  Chronic bronchitic changes with focal scarring centrally in the right middle lung and right upper lung, improved since previous study. Soft tissue density in the anterior mediastinum may represent residual thymus or adenopathy. This appears stable since previous study.  CTA ABDOMEN AND PELVIS  Findings:  Calcification and atherosclerotic changes in the abdominal aorta, common iliac arteries, and external iliac arteries.  No evidence of aneurysm or dissection.  The celiac axis, superior mesenteric artery, single right renal artery, and duplicated left renal arteries appear patent.  The inferior mesenteric artery is patent.  The iliac, external iliac, and internal iliac arteries are patent although there may be some stenosis of the origins of the internal iliac arteries bilaterally.  Sub centimeter cyst in the right lobe of the liver anteriorly.  The liver, spleen, gallbladder, pancreas, kidneys, and retroperitoneal lymph nodes are otherwise unremarkable.  Fat-containing nodule in the left adrenal gland as previously discussed consistent with  adenoma.  The stomach, small bowel, and colon are not abnormally distended.  No free air or free fluid in the abdomen.  No abnormal mesenteric or retroperitoneal fluid collections.  Pelvis:  The prostate gland is not enlarged.  Bladder wall is not thickened.  No inflammatory changes in the sigmoid colon.  The appendix is normal.  Normal alignment of the lumbar vertebra with mild degenerative change.   Review of the MIP images confirms the above findings.  IMPRESSION: Diffuse atherosclerotic changes in the aorta and branch vessels. No evidence of aneurysm or dissection.  Note that imaging of the abdomen and pelvis was not order was obtained  in error.  Imaging and interpretation of the abdomen pelvis is obtained at no charge to the patient.  Original Report Authenticated By: Marlon Pel, M.D.     Echocardiogram:Pending Telemetry: NSR   Assessment and Plan   1. NSTEMI  Patient presents with chest pain and mild elevated troponin. No ischemic changes noted on EKG. Multiple CAD risk factors noted including newly diagnosed HTN and Tobacco abuse.   08/24/12 cardiac cath--EF 45%, non-obstructive CAD, Possible myopericarditis or stress induced cardiomyopathy (Takotsubo's CM) with subtle elevation of troponin and segmental LV dysfunction   - ASA, statin, Betablocker and ACEI  - Trend CE minimum elevation and non acute EKG - risk stratifications including TSH, free T4, Lipid panel, Hb A1C, Tobacco cessation, HTN control. 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 - No s/s pericarditis, can not rule out myocarditis, CRP/ESR pending. May consider cardiac MRI with gadolinium to further evaluate for myocarditis.  .  2. Chronic systolic CHF/NICM, EF 44% on Myoview. Stable,      Multiple factorial including HTN, obesity, Heavy ETOH abuse, stress CM ( Hyperthyroidism noted on this admission).   3. Hyperthyroidism, free T4 5.5, TSH < 0.008 - Thyroid  stimulating Immunoglobulin - Need RAIU to differentiate  - Outpatient Endocrinology f/u  4. Prediabetes. HBA1C 5.9     DM consult  5. Elevated transaminase level- likely associated with his NSTEMI, will trend.  6. HTN- Betablocker coreg and ACEI lisinopril added. Hold home dose Hyzaar  7. HLD, LDL 107, HDL 29, on statin 8. Tobacco abuse- counsel  9. COPD- Not formally evaluated. Need outpatient pulmonary evaluation.  10. 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 VTE: Heparin  Recurrent Chest with minimum Troponin elevation and non-acute EKG. Non obstructive CAD noted on cath. Reduced systolic function/MICM with EF 45% is concerning in the setting of Ho heavy ETOH abuse, obesity. HTN and newly diagnosed hyperthyroidism. Possible myocarditis or stress induced cardiomyopathy (Takotsubo's CM) with subtle elevation of troponin and segmental LV dysfunction.   His hyperthyroidism could contribute to some degree of his cardiac symptoms or stress induced CM. Will need further RAIU work up on thyroid function to differentiate. TSI is ordered for ? Graves disease.  Will need consider cardiac MRI for possible myocarditis eval.   Signed, LI, NA  PGY-2 7:36 AM  Patient seen with resident, agree with note.  Labs today suggest hyperthyroidism.  1. Cardiomyopathy: Cath yesterday with no significant coronary disease.  Nonischemic cardiomyopathy => my suspicion is that the cardiomyopathy may be a stress-type cardiomyopathy related to uncontrolled hyperthyroidism.  - Echo today to confirm EF.  - Continue Coreg to 12.5 mg bid and continue lisinopril.  2. Hyperthyroidism: Diffusely enlarged thyroid, ? Graves disease.  Will check thyroid-stimulating antibody.  Has had significant weight loss.  Proximate cause for admission is chest pain, which may be related to the hyperthyroidism.  We  will start with methimazole 5 mg tid today.  We will consult endocrinology.    Marca Ancona 08/25/2012 8:50 AM

## 2012-08-25 NOTE — Progress Notes (Signed)
Echocardiogram 2D Echocardiogram has been performed.  Dekendrick Uzelac 08/25/2012, 11:49 AM

## 2012-08-25 NOTE — Progress Notes (Signed)
Cardiology Night Coverage Brief Note  Called due to patient continuing to have intermittent, sudden onset of severe chest pain associated with diaphoresis. Chart review reveals interesting case of 51 yo male with tobacco abuse admitted with chest pain as described above and mildly elevated troponin; found to have nonobstructive CAD and LVgram with antero and infero apical hypokinesis, overall 45%. Echo pending. Differential includes vasospastic coronary disease, Takotsubo's vs. Myopericarditis.  Went to talk with patient but had just received oxycodone and pain was relieved and he was drowsy. No rub on exam.  Considered high dose aspirin for pericardial symptoms but per Uptodate, NSAIDs found to be harmful in the mouse model of myocarditis. Will add ESR and CRP to AM labs. Cardiology team could consider cardiac MRI with gadolinium to further evaluate for myocarditis if diagnosis still unclear. 

## 2012-08-25 NOTE — Consult Note (Signed)
Chart was reviewed and patient was examined. X-rays were reviewed.  Patient's symptoms are not typical for GERD or PUD.  Must consider biliary colic, esophageal spasm as other possiblities.    Recommend 1. LFTs 2.  Ultrasound 3.  EGD if #s 1 and 2 are negative  Barbette Hair. Arlyce Dice, M.D., [facility] Cell [phone]

## 2012-08-25 NOTE — Consult Note (Signed)
[facility] Consultation  Referring Provider: [facility] Primary Care Physician:  No primary provider on file. Primary Gastroenterologist:   None locally Reason for Consultation:  chest pain  HPI: Alejandro Reyes is a 51 y.o. male who works in Administrator, arts here at [employer]. He has a history of chronic systolic heart failure, hypertension, COPD with ongoing tobacco use and alcoholism. A month ago he developed chest pain which he thought, and still believes is secondary to GERD. The chest pain is not necessarily related to meals. It is non-exertional. The pain is sporadic. No previous history of this chest pain prior to a month ago. He denies prior history of GERD. No dysphagia. Patient began taking OTC GERD medications without any relief in pain but he still adamant that this is GERD related. He has no regurgitation.  Patient was in the hospital late last month for evaluation of chest pain. His cardiac enzymes at that time were negative, no ischemia by EKG. He had a Myoview showing an ejection fraction of 44% with septal hypokinesia but no signs of reversible ischemia.  Patient was readmitted yesterday with the same chest pain. He had mildly elevated troponin and  working diagnosis was NSEMI. He had a cardiac catheterization which revealed mild to moderate nonobstructive coronary artery disease , segmental  LV systolic dysfunction, and possible myopericarditis or stress-induced cardiomyopathy. Patient has continued to have episodes of severe chest pain . He is being further evaluated for the possibility of myocarditis. In the meantime, patient did get some relief with a GI cocktail.   Patient denies constipation, diarrhea, vomiting or weight loss. He occasionally sees a small amount of bright red blood in his BM. Sounds like he had a colonoscopy in [place] in 2007.   No FMH of colon cancer. Father had throat cancer. No liver disease in family  Past Medical History  Diagnosis  Date  . Hypertension   . Chronic systolic CHF (congestive heart failure), NYHA class 1   . Tobacco abuse   . COPD (chronic obstructive pulmonary disease)   . GERD (gastroesophageal reflux disease)   . Alcohol abuse, in remission     Past Surgical History  Procedure Date  . Tonsillectomy     Prior to Admission medications   Medication Sig Start Date End Date Taking? Authorizing Provider  alum & mag hydroxide-simeth (MAALOX/MYLANTA) 200-200-20 MG/5ML suspension Take 15 mLs by mouth every 6 (six) hours as needed. For acid relux   Yes Historical Provider, MD  carvedilol (COREG) 6.25 MG tablet Take 1 tablet (6.25 mg total) by mouth 2 (two) times daily with a meal. 08/15/12  Yes Hollice Espy, MD  losartan-hydrochlorothiazide (HYZAAR) 50-12.5 MG per tablet Take 1 tablet by mouth daily. 08/15/12  Yes Hollice Espy, MD  omeprazole (PRILOSEC) 20 MG capsule Take 20 mg by mouth daily.   Yes Historical Provider, MD    Current Facility-Administered Medications  Medication Dose Route Frequency Provider Last Rate Last Dose  . 0.9 %  sodium chloride infusion  250 mL Intravenous PRN Na Li, MD      . [EXPIRED] 0.9 %  sodium chloride infusion   Intravenous Continuous Kathleene Hazel, MD      . acetaminophen (TYLENOL) tablet 650 mg  650 mg Oral Q4H PRN Na Li, MD      . alum & mag hydroxide-simeth (MAALOX/MYLANTA) 200-200-20 MG/5ML suspension 30 mL  30 mL Oral Q4H PRN Dolores Patty, MD   30 mL at 08/25/12 0256  . aspirin  EC tablet 81 mg  81 mg Oral Daily Na Li, MD   81 mg at 08/25/12 0858  . atorvastatin (LIPITOR) tablet 40 mg  40 mg Oral q1800 Na Li, MD   40 mg at 08/24/12 1647  . carvedilol (COREG) tablet 12.5 mg  12.5 mg Oral BID WC Na Li, MD      . [COMPLETED] gi cocktail (Maalox,Lidocaine,Donnatal)  30 mL Oral To Cath Kathleene Hazel, MD   30 mL at 08/24/12 1425  . [COMPLETED] gi cocktail (Maalox,Lidocaine,Donnatal)  30 mL Oral STAT Na Li, MD   30 mL at 08/25/12 0919  . gi  cocktail (Maalox,Lidocaine,Donnatal)  30 mL Oral TID PRN Na Li, MD      . heparin injection 5,000 Units  5,000 Units Subcutaneous Q8H Na Li, MD      . lisinopril (PRINIVIL,ZESTRIL) tablet 10 mg  10 mg Oral Daily Na Li, MD   10 mg at 08/25/12 0858  . magnesium oxide (MAG-OX) tablet 200 mg  200 mg Oral BID Na Li, MD      . methimazole (TAPAZOLE) tablet 5 mg  5 mg Oral Q8H Na Li, MD      . [COMPLETED] metoprolol (LOPRESSOR) 1 MG/ML injection           . morphine 2 MG/ML injection 2 mg  2 mg Intravenous Q1H PRN Kathleene Hazel, MD   2 mg at 08/25/12 0826  . nitroGLYCERIN (NITROSTAT) SL tablet 0.4 mg  0.4 mg Sublingual Q5 Min x 3 PRN Na Li, MD      . ondansetron (ZOFRAN) injection 4 mg  4 mg Intravenous Q6H PRN Na Li, MD      . oxyCODONE-acetaminophen (PERCOCET/ROXICET) 5-325 MG per tablet 1-2 tablet  1-2 tablet Oral Q4H PRN Kathleene Hazel, MD   2 tablet at 08/25/12 0256  . pantoprazole (PROTONIX) EC tablet 40 mg  40 mg Oral BID AC Na Li, MD   40 mg at 08/25/12 0858  . sodium chloride 0.9 % injection 3 mL  3 mL Intravenous Q12H Na Li, MD   3 mL at 08/24/12 2000  . sodium chloride 0.9 % injection 3 mL  3 mL Intravenous PRN Na Li, MD      . sucralfate (CARAFATE) tablet 1 g  1 g Oral TID WC & HS Na Li, MD      . [DISCONTINUED] 0.9 %  sodium chloride infusion  250 mL Intravenous PRN Na Li, MD      . [DISCONTINUED] 0.9 %  sodium chloride infusion   Intravenous Continuous Na Li, MD      . [DISCONTINUED] acetaminophen (TYLENOL) tablet 650 mg  650 mg Oral Q4H PRN Kathleene Hazel, MD      . [address] aspirin chewable tablet 324 mg  324 mg Oral NOW Na Li, MD      . [DISCONTINUED] aspirin chewable tablet 81 mg  81 mg Oral Daily Kathleene Hazel, MD      . [address] aspirin suppository 300 mg  300 mg Rectal NOW Na Li, MD      . [DISCONTINUED] atorvastatin (LIPITOR) tablet 40 mg  40 mg Oral q1800 Kathleene Hazel, MD      . [DISCONTINUED] carvedilol (COREG) tablet 6.25 mg   6.25 mg Oral BID WC Na Li, MD   6.25 mg at 08/24/12 1647  . [DISCONTINUED] carvedilol (COREG) tablet 6.25 mg  6.25 mg Oral BID WC Kathleene Hazel, MD      . [  DISCONTINUED] diazepam (VALIUM) tablet 2 mg  2 mg Oral On Call Dede Query, MD      . [DISCONTINUED] ondansetron (ZOFRAN) injection 4 mg  4 mg Intravenous Q6H PRN Kathleene Hazel, MD      . [DISCONTINUED] pantoprazole (PROTONIX) EC tablet 40 mg  40 mg Oral Daily Na Li, MD      . [DISCONTINUED] pantoprazole (PROTONIX) EC tablet 40 mg  40 mg Oral Daily Dolores Patty, MD   40 mg at 08/24/12 1646  . [DISCONTINUED] sodium chloride 0.9 % injection 3 mL  3 mL Intravenous Q12H Na Li, MD      . [DISCONTINUED] sodium chloride 0.9 % injection 3 mL  3 mL Intravenous PRN Dede Query, MD        Allergies as of 08/24/2012  . (No Known Allergies)    Family History  Problem Relation Age of Onset  . Hypertension Father   . Cancer - Other Father     SCCa of Oropharynx    History   Social History  . Marital Status: Single    Spouse Name: N/A    Number of Children: N/A  . Years of Education: N/A   Occupational History  . Not on file.   Social History Main Topics  . Smoking status: Current Every Day Smoker    Types: Cigarettes  . Smokeless tobacco: Not on file  . Alcohol Use: No  . Drug Use: No  . Sexually Active:    Review of Systems: All systems reviewed and negative except where noted in HPI  PHYSICAL EXAM: Vital signs in last 24 hours: Temp:  [97.9 F (36.6 C)-99.2 F (37.3 C)] 98.6 F (37 C) (11/07 1200) Pulse Rate:  [79-113] 101  (11/07 1200) Resp:  [16-28] 18  (11/07 1200) BP: (84-134)/(47-96) 103/58 mmHg (11/07 1200) SpO2:  [95 %-100 %] 97 % (11/07 1200) Weight:  [224 lb 10.4 oz (101.9 kg)-225 lb 1.6 oz (102.105 kg)] 225 lb 1.6 oz (102.105 kg) (11/07 0500) Last BM Date: 08/23/12 General:   Pleasant black male in NAD Head:  Normocephalic and atraumatic. Eyes:   No icterus.   Conjunctiva pink. Ears:  Normal  auditory acuity. .Neck:  Supple; no masses felt Lungs:  Respirations even and unlabored. RLL wheezing and rhonchi Heart:  Regular rate and rhythm Abdomen:  Soft, nondistended, nontender. Normal bowel sounds. No appreciable masses or hepatomegaly.  Rectal:  Not performed.  Msk:  Symmetrical without gross deformities.  Extremities:  Without edema. Neurologic:  Alert and  oriented x4;  grossly normal neurologically. Skin:  Intact without significant lesions or rashes. Cervical Nodes:  No significant cervical adenopathy. Psych:  Alert and cooperative. Normal affect.  LAB RESULTS:  Basename 08/25/12 0318 08/24/12 0853  WBC 5.9 7.0  HGB 13.4 13.3  HCT 40.4 39.0  PLT 186 175   BMET  Basename 08/25/12 0318 08/24/12 0853  NA 139 140  K 3.7 3.9  CL 102 101  CO2 28 31  GLUCOSE 119* 125*  BUN 13 12  CREATININE 0.86 0.80  CALCIUM 10.0 10.3   LFT  Basename 08/24/12 0853  PROT 7.1  ALBUMIN 3.4*  AST 31  ALT 83*  ALKPHOS 125*  BILITOT 0.3  BILIDIR --  IBILI --   PT/INR  Basename 08/24/12 1141  LABPROT 13.4  INR 1.03    STUDIES: Dg Chest 2 View  08/24/2012  *RADIOLOGY REPORT*  Clinical Data: Chest pain, smoking history  CHEST - 2 VIEW  Comparison: Chest x-ray  of 08/12/2012 and CT angio chest of the same date.  Findings: No active infiltrate or effusion is seen.  There is peribronchial thickening consistent with bronchitis, possibly chronic.  Mediastinal contours are stable.  The heart is within normal limits in size.  No acute bony abnormality is seen.  IMPRESSION: Peribronchial thickening consistent with bronchitis.  No pneumonia or effusion.   Original Report Authenticated By: Dwyane Dee, M.D.      PREVIOUS ENDOSCOPIES: colonoscopy in [place] in 2007  IMPRESSION / PLAN: 1. chest pain, mildly elevated troponin. Patient has nonobstructive coronary artery disease. Myocarditis not excluded, cardiac workup still in progress. He felt better after a GI cocktail. He is also  on PPI and Carafate at present. Pain may be secondary to GERD but daily PPI at home did not help at all. This is at least his second hospitalization for chest pain so it is certainly reasonable to proceed with upper endoscopy if cardiology. Patient is agreeable. Will plan for EGD to be done tomorrow.    2. Non-obstructive CAD.  3. hypothyroidism, newly diagnosed. On Tapazole.   4. Tobacco use  5. History of alcoholism, in remission  6. Weight loss. Patient reported stable weight but cardiology's note reports 30 pound weight loss last month.   7. Occasional bright red blood with BM. He reports a colonoscopy being done in [place] in 2007. We can evaluate this further as outpatient.     Thanks   LOS: 1 day   Willette Cluster  08/25/2012, 1:46 PM

## 2012-08-25 NOTE — Progress Notes (Signed)
INITIAL ADULT NUTRITION ASSESSMENT Date: 08/25/2012   Time: 12:03 PM Reason for Assessment: Consult   INTERVENTION: 1. Low Iodine nutrition education completed 2. RD will add snacks to pt's diet order 3. RD will continue to follow   DOCUMENTATION CODES Per approved criteria  -Not Applicable    ASSESSMENT: Male 51 y.o.  Dx: NSTEMI (non-ST elevated myocardial infarction)  Hx:  Past Medical History  Diagnosis Date  . Hypertension   . Chronic systolic CHF (congestive heart failure), NYHA class 1   . Tobacco abuse   . COPD (chronic obstructive pulmonary disease)   . GERD (gastroesophageal reflux disease)   . Alcohol abuse, in remission     Past Surgical History  Procedure Date  . Tonsillectomy     Related Meds:     . aspirin EC  81 mg Oral Daily  . atorvastatin  40 mg Oral q1800  . carvedilol  12.5 mg Oral BID WC  . [COMPLETED] fentaNYL      . [COMPLETED] gi cocktail  30 mL Oral To Cath  . [COMPLETED] gi cocktail  30 mL Oral STAT  . [COMPLETED] heparin      . [COMPLETED] heparin      . heparin subcutaneous  5,000 Units Subcutaneous Q8H  . [COMPLETED] lidocaine      . lisinopril  10 mg Oral Daily  . magnesium oxide  200 mg Oral BID  . methimazole  5 mg Oral Q8H  . [COMPLETED] metoprolol      . [COMPLETED] midazolam      . [COMPLETED] nitroGLYCERIN      . pantoprazole  40 mg Oral BID AC  . sodium chloride  3 mL Intravenous Q12H  . sucralfate  1 g Oral TID WC & HS  . [COMPLETED] verapamil      . [COMPLETED] aspirin  324 mg Oral NOW  . [DISCONTINUED] aspirin  81 mg Oral Daily  . [COMPLETED] aspirin  300 mg Rectal NOW  . [DISCONTINUED] atorvastatin  40 mg Oral q1800  . [DISCONTINUED] carvedilol  6.25 mg Oral BID WC  . [DISCONTINUED] carvedilol  6.25 mg Oral BID WC  . [DISCONTINUED] diazepam  2 mg Oral On Call  . [DISCONTINUED] pantoprazole  40 mg Oral Daily  . [DISCONTINUED] pantoprazole  40 mg Oral Daily  . [DISCONTINUED] sodium chloride  3 mL Intravenous Q12H       Ht: 6' 2.02" (188 cm)  Wt: 225 lb 1.6 oz (102.105 kg)  Ideal Wt: 86.3 kg  % Ideal Wt: 181 kg   Usual Wt:  Wt Readings from Last 5 Encounters:  08/25/12 225 lb 1.6 oz (102.105 kg)  08/25/12 225 lb 1.6 oz (102.105 kg)  08/13/12 232 lb 2.3 oz (105.3 kg)  12/04/09 263 lb (119.296 kg)  11/15/09 253 lb (114.76 kg)    % Usual Wt: 97%  Body mass index is 28.89 kg/(m^2). Pt is overweight per current BMI   Food/Nutrition Related Hx: Pt indicated a 30 lb weight loss in the past month.   Labs:  CMP     Component Value Date/Time   NA 139 08/25/2012 0318   K 3.7 08/25/2012 0318   CL 102 08/25/2012 0318   CO2 28 08/25/2012 0318   GLUCOSE 119* 08/25/2012 0318   BUN 13 08/25/2012 0318   CREATININE 0.86 08/25/2012 0318   CALCIUM 10.0 08/25/2012 0318   PROT 7.1 08/24/2012 0853   ALBUMIN 3.4* 08/24/2012 0853   AST 31 08/24/2012 0853   ALT 83* 08/24/2012  0853   ALKPHOS 125* 08/24/2012 0853   BILITOT 0.3 08/24/2012 0853   GFRNONAA >90 08/25/2012 0318   GFRAA >90 08/25/2012 0318      Intake/Output Summary (Last 24 hours) at 08/25/12 1206 Last data filed at 08/25/12 0800  Gross per 24 hour  Intake    735 ml  Output   1250 ml  Net   -515 ml     Diet Order: Cardiac  Supplements/Tube Feeding: none   IVF:    [EXPIRED] sodium chloride  [DISCONTINUED] sodium chloride    Estimated Nutritional Needs:   Kcal: 2200-2400  Protein: 85-95 gm Fluid:  2-2.4 L   Pt was admitted with NSTEMI.   RD notified that pt told MD and RN that he had lost 30 lbs in the past month. Concern for hyperthyroidism, MD requests low iodine diet education.  Weight hx shows 7 lb weight loss (3% body weight) since admission. This admission weight is stable with previous admission weight. Pt has been eating 100% of meals and still hungry. Requesting snacks between meals, RD will set up.   RD provided pt with low Iodine nutrition therapy hand outs. Pt educated on foods high in iodine and foods to avoid. Pt currently  is eating "Healthy Choice" meals up to 4 times daily as his only meals. Encouraged pt to cook meals as frozen meals are high in salt. Pt agreeable to trying to reduce his processed foods consumption.   NUTRITION DIAGNOSIS: -Food and nutrition related knowledge deficit (NB-1.1).  Status: Ongoing  RELATED TO: no previous diet education  AS EVIDENCE BY: new low iodine diet   MONITORING/EVALUATION(Goals): Goal: PO intake to meet >/=90% estimated nutrition needs  Monitor: PO intake, weight, labs, I/O's, education needs   EDUCATION NEEDS: -Education needs addressed    Clarene Duke RD, LDN Pager [phone] After Hours pager [phone]  08/25/2012, 12:03 PM

## 2012-08-26 ENCOUNTER — Encounter (HOSPITAL_COMMUNITY)
Admission: EM | Disposition: A | Discharge status: Home / Self Care | Payer: Self-pay | Source: Home / Self Care | Attending: Internal Medicine

## 2012-08-26 ENCOUNTER — Encounter (HOSPITAL_COMMUNITY): Payer: Self-pay | Admitting: *Deleted

## 2012-08-26 ENCOUNTER — Inpatient Hospital Stay (HOSPITAL_COMMUNITY): Payer: 59

## 2012-08-26 HISTORY — PX: ESOPHAGOGASTRODUODENOSCOPY: SHX5428

## 2012-08-26 LAB — CBC
HCT: 37.6 % — ABNORMAL LOW (ref 39.0–52.0)
Hemoglobin: 12.5 g/dL — ABNORMAL LOW (ref 13.0–17.0)
MCH: 28.5 pg (ref 26.0–34.0)
MCHC: 33.2 g/dL (ref 30.0–36.0)
MCV: 85.6 fL (ref 78.0–100.0)
RBC: 4.39 MIL/uL (ref 4.22–5.81)

## 2012-08-26 LAB — HEPATIC FUNCTION PANEL
ALT: 65 U/L — ABNORMAL HIGH (ref 0–53)
Alkaline Phosphatase: 119 U/L — ABNORMAL HIGH (ref 39–117)
Bilirubin, Direct: 0.1 mg/dL (ref 0.0–0.3)
Indirect Bilirubin: 0.3 mg/dL (ref 0.3–0.9)
Total Bilirubin: 0.4 mg/dL (ref 0.3–1.2)
Total Protein: 6.9 g/dL (ref 6.0–8.3)

## 2012-08-26 SURGERY — EGD (ESOPHAGOGASTRODUODENOSCOPY)
Anesthesia: Moderate Sedation

## 2012-08-26 MED ORDER — FENTANYL CITRATE 0.05 MG/ML IJ SOLN
INTRAMUSCULAR | Status: DC | PRN
  Administered 2012-08-26 (×3): 25 ug via INTRAVENOUS

## 2012-08-26 MED ORDER — GLYCOPYRROLATE 0.2 MG/ML IJ SOLN
INTRAMUSCULAR | Status: AC
  Filled 2012-08-26: qty 1

## 2012-08-26 MED ORDER — FENTANYL CITRATE 0.05 MG/ML IJ SOLN
INTRAMUSCULAR | Status: AC
  Filled 2012-08-26: qty 4

## 2012-08-26 MED ORDER — MIDAZOLAM HCL 5 MG/5ML IJ SOLN
INTRAMUSCULAR | Status: DC | PRN
  Administered 2012-08-26 (×3): 2.5 mg via INTRAVENOUS

## 2012-08-26 MED ORDER — MIDAZOLAM HCL 5 MG/ML IJ SOLN
INTRAMUSCULAR | Status: AC
  Filled 2012-08-26: qty 4

## 2012-08-26 MED ORDER — BUTAMBEN-TETRACAINE-BENZOCAINE 2-2-14 % EX AERO
INHALATION_SPRAY | CUTANEOUS | Status: DC | PRN
  Administered 2012-08-26: 2 via TOPICAL

## 2012-08-26 MED ORDER — GLYCOPYRROLATE 0.2 MG/ML IJ SOLN
INTRAMUSCULAR | Status: DC | PRN
  Administered 2012-08-26: 0.2 mg via INTRAVENOUS

## 2012-08-26 NOTE — Op Note (Signed)
[facility] [address] [place], [address]   ENDOSCOPY PROCEDURE REPORT  PATIENT: Alejandro Reyes, Alejandro Reyes  MR#: [id number] BIRTHDATE: 1960-11-06 , 51  yrs. old GENDER: Male ENDOSCOPIST: Louis Meckel, MD REFERRED BY:  Willa Rough, M.D. PROCEDURE DATE:  08/26/2012 PROCEDURE:  EGD, diagnostic ASA CLASS:     Class II INDICATIONS:  chest pain. MEDICATIONS: These medications were titrated to patient response per physician's verbal order, Versed, Versed-Detailed 7.5 mg IV, Fentanyl 75 mcg IV, and Robinul 0.2 mg IV TOPICAL ANESTHETIC: Cetacaine Spray  DESCRIPTION OF PROCEDURE: After the risks benefits and alternatives of the procedure were thoroughly explained, informed consent was obtained.  The Pentax Gastroscope [id number] endoscope was introduced through the mouth and advanced to the third portion of the duodenum. Without limitations.  The instrument was slowly withdrawn as the mucosa was fully examined.      The upper, middle and distal third of the esophagus were carefully inspected and no abnormalities were noted.  The z-line was well seen at the GEJ.  The endoscope was pushed into the fundus which was normal including a retroflexed view.  The antrum, gastric body, first and second part of the duodenum were unremarkable. Retroflexed views revealed no abnormalities.     The scope was then withdrawn from the patient and the procedure completed.  COMPLICATIONS: There were no complications. ENDOSCOPIC IMPRESSION: Normal EGD  RECOMMENDATIONS:HIDA scan My REPEAT EXAM:  eSigned:  Louis Meckel, MD 08/26/2012 11:51 AM   CC:

## 2012-08-26 NOTE — Progress Notes (Signed)
Patient: Alejandro Reyes Date of Encounter: 08/26/2012, 7:11 AM Admit date: 08/24/2012     Subjective  Patient had a couple episodes of chest pain yesterday and overnight. Episode resolved with GI cocktail at dinner and overnight with morphine as he was NPO.  EKG unremarkable. Troponin still trending down. For EGD this morning.   Objective  Physical Exam: Vitals: BP 134/66  Pulse 101  Temp 98.6 F (37 C) (Oral)  Resp 19  Ht 6' 2.02" (1.88 m)  Wt 221 lb 5.5 oz (100.4 kg)  BMI 28.41 kg/m2  SpO2 98% General: Well developed, well appearing, in no acute distress. Stresses out  HEENT: Normocephalic, atraumatic. EOMs intact. Sclera nonicteric. Oropharynx clear.  Neck: Supple. Positive carotid bruits L>R. No JVD.  thyroid enlargement noted,  No thyroid nodule or bruit appreciated   Lungs: Respirations regular and unlabored, CTA bilaterally.  Heart: RRR. S1, S2 present. +s4 2/6 SEM at right 2nd intercostal space and left sternal boarder.  Abdomen: Soft, non-tender, non-distended. BS present x 4 quadrants. No hepatosplenomegaly.  Extremities: No clubbing, cyanosis or edema. DP/PT/Radials 2+ and equal bilaterally.  Psych: Normal affect.  Neuro: Alert and oriented X 3. Moves all extremities spontaneously.  Musculoskeletal: No kyphosis.  Skin: Intact. Warm and dry. No rashes or petechiae in exposed areas   Intake/Output:  Intake/Output Summary (Last 24 hours) at 08/26/12 0711 Last data filed at 08/26/12 0600  Gross per 24 hour  Intake      0 ml  Output   2800 ml  Net  -2800 ml    Inpatient Medications:     . aspirin EC  81 mg Oral Daily  . atorvastatin  40 mg Oral q1800  . carvedilol  12.5 mg Oral BID WC  . [COMPLETED] gi cocktail  30 mL Oral STAT  . heparin subcutaneous  5,000 Units Subcutaneous Q8H  . lisinopril  10 mg Oral Daily  . [COMPLETED] magnesium oxide  200 mg Oral BID  . methimazole  60 mg Oral Daily  . pantoprazole  40 mg Oral BID AC  . sodium chloride  3 mL  Intravenous Q12H  . sucralfate  1 g Oral TID WC & HS  . [DISCONTINUED] carvedilol  6.25 mg Oral BID WC  . [DISCONTINUED] methimazole  5 mg Oral Q8H  . [DISCONTINUED] pantoprazole  40 mg Oral Daily      . sodium chloride      Labs:  [facility] 08/25/12 0318 08/24/12 1610 08/24/12 0853  NA 139 -- 140  K 3.7 -- 3.9  CL 102 -- 101  CO2 28 -- 31  GLUCOSE 119* -- 125*  BUN 13 -- 12  CREATININE 0.86 -- 0.80  CALCIUM 10.0 -- 10.3  MG -- 1.7 --  PHOS -- -- --    Basename 08/26/12 0400 08/24/12 0853  AST 25 31  ALT 65* 83*  ALKPHOS 119* 125*  BILITOT 0.4 0.3  PROT 6.9 7.1  ALBUMIN 3.2* 3.4*   No results found for this basename: LIPASE:2,AMYLASE:2 in the last 72 hours  Basename 08/26/12 0400 08/25/12 0318 08/24/12 0853  WBC 5.6 5.9 --  NEUTROABS -- -- 3.1  HGB 12.5* 13.4 --  HCT 37.6* 40.4 --  MCV 85.6 86.0 --  PLT 167 186 --    Basename 08/25/12 0318 08/24/12 2144 08/24/12 1610 08/24/12 0853  CKTOTAL -- -- -- --  CKMB -- -- -- --  TROPONINI 0.52* 0.75* 0.53* 0.62*   No components found with this basename: POCBNP:3  No results found for this basename: DDIMER in the last 72 hours  Basename 08/24/12 1610  HGBA1C 5.9*    Basename 08/25/12 0318  CHOL 152  HDL 29*  LDLCALC 107*  TRIG 79  CHOLHDL 5.2    Basename 08/24/12 1610  TSH <0.008*  T4TOTAL --  T3FREE --  THYROIDAB --   No results found for this basename: VITAMINB12,FOLATE,FERRITIN,TIBC,IRON,RETICCTPCT in the last 72 hours  Radiology/Studies: Dg Chest 2 View  08/24/2012  *RADIOLOGY REPORT*  Clinical Data: Chest pain, smoking history  CHEST - 2 VIEW  Comparison: Chest x-ray of 08/12/2012 and CT angio chest of the same date.  Findings: No active infiltrate or effusion is seen.  There is peribronchial thickening consistent with bronchitis, possibly chronic.  Mediastinal contours are stable.  The heart is within normal limits in size.  No acute bony abnormality is seen.  IMPRESSION: Peribronchial thickening  consistent with bronchitis.  No pneumonia or effusion.   Original Report Authenticated By: Dwyane Dee, M.D.    Dg Chest 2 View  08/12/2012  *RADIOLOGY REPORT*  Clinical Data: Chest pain  CHEST - 2 VIEW  Comparison: 08/04/2012  Findings: Lungs are essentially clear.  No pleural effusion or pneumothorax.  Apparent mild patchy left midlung opacity likely reflects normal pulmonary markings, as it is unchanged from multiple prior studies and without correlate on prior CT.  Cardiomediastinal silhouette is within normal limits.  Mild degenerative changes of the visualized thoracolumbar spine.  IMPRESSION: No evidence of acute cardiopulmonary disease.   Original Report Authenticated By: Charline Bills, M.D.    Dg Chest 2 View  08/04/2012  *RADIOLOGY REPORT*  Clinical Data: Chest pain.  CHEST - 2 VIEW  Comparison: 11/18/2009  Findings: Previously seen opacity in the central right upper lobe is no longer visualized.  No evidence of acute infiltrate or edema. No evidence of pleural effusion.  Heart size is normal.  There is no hilar or mediastinal lymphadenopathy identified.  IMPRESSION: No active disease.   Original Report Authenticated By: Danae Orleans, M.D.    Nm Myocar Multi W/spect W/wall Motion / Ef  08/14/2012  *RADIOLOGY REPORT*  Clinical Data:  51 year old male with chest pain and hypertension.  MYOCARDIAL IMAGING WITH SPECT (REST AND PHARMACOLOGIC-STRESS) GATED LEFT VENTRICULAR WALL MOTION STUDY LEFT VENTRICULAR EJECTION FRACTION  Technique:  Resting myocardial SPECT imaging was initially performed after intravenous administration of radiopharmaceutical. Myocardial SPECT was subsequently performed after additional radiopharmaceutical injection during pharmacologic-stress supervised by the Cardiology staff.  Quantitative gated imaging was also performed to evaluate left ventricular wall motion, and estimate left ventricular ejection fraction.  Radiopharmaceutical:  Tc-29m Myoview 10 mCi at rest and  during stress.  Comparison: none  Findings:  Technique: Study is adequate.  Perfusion:  There are no relative decreased counts on stress or rest to suggest reversible ischemia or infarction.  Wall motion: Septal hypokinesia.  Left ventricular ejection fraction:  Calculated left ventricular ejection fraction =  44%  IMPRESSION:  1.  No reversible ischemia or infarction.  2. Septal hypokinesia  3.  Left ventricular ejection fraction equal 44%   Original Report Authenticated By: Genevive Bi, M.D.    Echocardiogram: EF 60-65%, mild LVH. Telemetry: NSR   Assessment and Plan   1. NSTEMI  Patient presents with chest pain and mild elevated troponin. No ischemic changes noted on EKG. Multiple CAD risk factors noted including newly diagnosed HTN and Tobacco abuse.   08/24/12 cardiac cath--EF 45%, non-obstructive CAD, Possible myopericarditis or stress induced cardiomyopathy (Takotsubo's  CM) with subtle elevation of troponin and segmental LV dysfunction   - ASA, statin, Betablocker and ACEI  - Troponin trending down - risk stratifications including TSH, free T4, Lipid panel, Hb A1C, Tobacco cessation, HTN control. 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 - No s/s pericarditis, can not rule out myocarditis, CRP/ESR pending. May consider cardiac MRI with gadolinium to further evaluate for myocarditis.  .  2. Chronic systolic CHF/NICM, EF 44% on Myoview. Stable,      Multiple factorial including HTN, obesity, Heavy ETOH abuse, stress CM ( Hyperthyroidism noted on this admission).   3. Hyperthyroidism, free T4 5.5, TSH < 0.008 - Thyroid stimulating Immunoglobulin - Endo consult increased dose of methimasole to 60 mg daily as recent exposure to iodinated contrast. - Outpatient Endocrinology f/u  4. Prediabetes. HBA1C 5.9     DM consult  5. Elevated transaminase level- likely associated with his NSTEMI, will trend.  6. HTN- Betablocker  coreg and ACEI lisinopril added. Hold home dose Hyzaar  7. HLD, LDL 107, HDL 29, on statin 8. Tobacco abuse- counsel  9. COPD- Not formally evaluated. Need outpatient pulmonary evaluation.  10. 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 VTE: Heparin  KOLLAR, ELIZABETH 7:15 AM, 08/26/2012 PGY-2  Patient examined chart reviewed No epicardial disease EF improved last echo.  No indication for MRI  Since EF improved.  For EGD today more likely cause of pains  Charlton Haws

## 2012-08-26 NOTE — H&P (View-Only) (Signed)
Cardiology Night Coverage Brief Note  Called due to patient continuing to have intermittent, sudden onset of severe chest pain associated with diaphoresis. Chart review reveals interesting case of 51 yo male with tobacco abuse admitted with chest pain as described above and mildly elevated troponin; found to have nonobstructive CAD and LVgram with antero and infero apical hypokinesis, overall 45%. Echo pending. Differential includes vasospastic coronary disease, Takotsubo's vs. Myopericarditis.  Went to talk with patient but had just received oxycodone and pain was relieved and he was drowsy. No rub on exam.  Considered high dose aspirin for pericardial symptoms but per Upt[employer], NSAIDs found to be harmful in the mouse model of myocarditis. Will add ESR and CRP to AM labs. Cardiology team could consider cardiac MRI with gadolinium to further evaluate for myocarditis if diagnosis still unclear.

## 2012-08-26 NOTE — Interval H&P Note (Signed)
History and Physical Interval Note:  08/26/2012 11:25 AM  Alejandro Reyes  has presented today for surgery, with the diagnosis of chest pain  The various methods of treatment have been discussed with the patient and family. After consideration of risks, benefits and other options for treatment, the patient has consented to  Procedure(s) (LRB) with comments: ESOPHAGOGASTRODUODENOSCOPY (EGD) (N/A) as a surgical intervention .  The patient's history has been reviewed, patient examined, no change in status, stable for surgery.  I have reviewed the patient's chart and labs.  Questions were answered to the patient's satisfaction.     The recent H&P (dated *08/25/12**) was reviewed, the patient was examined and there is no change in the patients condition since that H&P was completed.   [name]  08/26/2012, 11:25 AM  [name]

## 2012-08-26 NOTE — Progress Notes (Signed)
Subjective: [name] experienced episodes of chest pain overnight. Either a GI cocktail or intravenous morphine promptly relieve the pain. Otherwise he is feeling relatively well.  Review of systems: No noticeable tremor, no palpitations overnight.  Objective: Vital signs in last 24 hours: Temp:  [97.9 F (36.6 C)-99 F (37.2 C)] 98.6 F (37 C) (11/08 0400) Pulse Rate:  [91-101] 101  (11/07 1200) Resp:  [17-25] 19  (11/08 0600) BP: (93-140)/(46-96) 134/66 mmHg (11/08 0600) SpO2:  [97 %-100 %] 98 % (11/08 0400) Weight:  [100.4 kg (221 lb 5.5 oz)] 100.4 kg (221 lb 5.5 oz) (11/08 0500) Weight change: -4.9 kg (-10 lb 12.8 oz) Last BM Date: 08/23/12  Intake/Output from previous day: 11/07 0701 - 11/08 0700 In: -  Out: 2800 [Urine:2800] Intake/Output this shift:   General: No apparent distress. Eyes: Anicteric, no scleral show, minimal if any periorbital puffiness, no lid lag. Neck: Supple, trachea midline. Thyroid: unchanged from prior exam. Cardiovascular: Regular rhythm but mildly rapid rate, full radial pulses. Respiratory: Normal respiratory effort, clear to auscultation. Gastrointestinal: Normal pitch active bowel sounds, nontender abdomen without distention or appreciable hepatomegaly. Neurologic: Cranial nerves normal as tested, patellar deep tendon reflexes brisk and symmetric, slight tremor in hands. Musculoskeletal: Normal muscle tone, no muscle atrophy. Skin: Mildly excessive warmth, no visible rash. Mental status: Alert, conversant, speech clear, thought logical, appropriate mood and affect, no hallucinations or delusions evident. Hematologic/lymphatic: No cervical adenopathy, no jaundice.  Lab Results:  Basename 08/26/12 0400 08/25/12 0318  WBC 5.6 5.9  HGB 12.5* 13.4  HCT 37.6* 40.4  PLT 167 186   BMET  Basename 08/25/12 0318 08/24/12 0853  NA 139 140  K 3.7 3.9  CL 102 101  CO2 28 31  GLUCOSE 119* 125*  BUN 13 12  CREATININE 0.86 0.80    CALCIUM 10.0 10.3    Medications: I have reviewed the patient's current medications.  Assessment/Plan: 1.  Hyperthyroidism.  Note:  Normocytic anemia, hyperglycemia, elevated alkaline phosphatase (including bone specific alkaline phosphatase), elevated liver enzymes, and a variety of cardiovascular manifestations may occur in the setting of thyrotoxicosis.  These may improve as his hyperthyroidism improves.    Education given, including handouts from the [employer] website for the following topics:  Hyperthyroidism.  1.  Continue methimazole 60 mg by mouth once a day.   2.  Continue beta blocker therapy as managed by his cardiology team. 3.  Outpatient endocrinology follow-up as recommended in my initial consult note from 08/25/2012. 4.  No new recommendations at this time.  Please call me or my colleagues at [phone] if additional questions or concerns arise.   LOS: 2 days   Lace Chenevert 08/26/2012, 7:41 AM

## 2012-08-26 NOTE — Progress Notes (Signed)
Following pt. Please specify when ready for ambulation. Thx. Ethelda Chick CES, ACSM

## 2012-08-26 NOTE — Progress Notes (Signed)
Upper endoscopy is entirely normal.  Ultrasound is not diagnostic. There is under distention of the gallbladder rendering interpretation difficult.  I am still not convinced  that chest pains are due to esophageal reflux. Esophageal spasm is a possibility. Biliary colic remains a concern.  Will proceed with HIDA scan

## 2012-08-27 ENCOUNTER — Inpatient Hospital Stay (HOSPITAL_COMMUNITY): Payer: 59

## 2012-08-27 LAB — CBC
HCT: 36.9 % — ABNORMAL LOW (ref 39.0–52.0)
MCHC: 34.1 g/dL (ref 30.0–36.0)
Platelets: 166 10*3/uL (ref 150–400)
RDW: 12.1 % (ref 11.5–15.5)
WBC: 6.5 10*3/uL (ref 4.0–10.5)

## 2012-08-27 MED ORDER — ALUM & MAG HYDROXIDE-SIMETH 200-200-20 MG/5ML PO SUSP
15.0000 mL | ORAL | Status: DC | PRN
  Administered 2012-08-27 – 2012-08-28 (×2): 15 mL via ORAL
  Filled 2012-08-27 (×2): qty 30

## 2012-08-27 MED ORDER — TECHNETIUM TC 99M MEBROFENIN IV KIT
5.0000 | PACK | Freq: Once | INTRAVENOUS | Status: AC | PRN
  Administered 2012-08-27: 5 via INTRAVENOUS

## 2012-08-27 NOTE — Progress Notes (Signed)
Pt c/o chest pain, IV morphine given with relief, VSS, stat EKG done, Dr. Arlyce Dice notified and ordered maalox, after morphine pt states he feels better and is resting in bed, will continue to monitor Worthy Flank, RN

## 2012-08-27 NOTE — Progress Notes (Signed)
Patient: Alejandro Reyes Date of Encounter: 08/27/2012, 8:00 AM Admit date: 08/24/2012     Subjective     Objective  Physical Exam: Vitals: BP 122/60  Pulse 77  Temp 98.3 F (36.8 C) (Oral)  Resp 21  Ht 6' 2.02" (1.88 m)  Wt 218 lb 14.7 oz (99.3 kg)  BMI 28.10 kg/m2  SpO2 100% General: Well developed, well appearing, in no acute distress. Stresses out  HEENT: Normocephalic, atraumatic. EOMs intact. Sclera nonicteric. Oropharynx clear.  Neck: Supple. Positive carotid bruits L>R. No JVD. thyroid enlargement noted, No thyroid nodule or bruit appreciated  Lungs: Respirations regular and unlabored, CTA bilaterally.  Heart: RRR. S1, S2 present. +s4 2/6 SEM at right 2nd intercostal space and left sternal boarder.  Abdomen: Soft, non-tender, non-distended. BS present x 4 quadrants. No hepatosplenomegaly.  Extremities: No clubbing, cyanosis or edema. DP/PT/Radials 2+ and equal bilaterally.  Psych: Normal affect.  Neuro: Alert and oriented X 3. Moves all extremities spontaneously.  Musculoskeletal: No kyphosis.  Skin: Intact. Warm and dry. No rashes or petechiae in exposed areas   Intake/Output:  Intake/Output Summary (Last 24 hours) at 08/27/12 0800 Last data filed at 08/27/12 0500  Gross per 24 hour  Intake   1080 ml  Output   2400 ml  Net  -1320 ml    Inpatient Medications:     . aspirin EC  81 mg Oral Daily  . atorvastatin  40 mg Oral q1800  . carvedilol  12.5 mg Oral BID WC  . heparin subcutaneous  5,000 Units Subcutaneous Q8H  . lisinopril  10 mg Oral Daily  . methimazole  60 mg Oral Daily  . pantoprazole  40 mg Oral BID AC  . sodium chloride  3 mL Intravenous Q12H  . sucralfate  1 g Oral TID WC & HS      . [DISCONTINUED] sodium chloride      Labs:  [facility] 08/25/12 0318 08/24/12 1610 08/24/12 0853  NA 139 -- 140  K 3.7 -- 3.9  CL 102 -- 101  CO2 28 -- 31  GLUCOSE 119* -- 125*  BUN 13 -- 12  CREATININE 0.86 -- 0.80  CALCIUM 10.0 -- 10.3  MG -- 1.7  --  PHOS -- -- --    Basename 08/26/12 0400 08/24/12 0853  AST 25 31  ALT 65* 83*  ALKPHOS 119* 125*  BILITOT 0.4 0.3  PROT 6.9 7.1  ALBUMIN 3.2* 3.4*    Basename 08/27/12 0540 08/26/12 0400 08/24/12 0853  WBC 6.5 5.6 --  NEUTROABS -- -- 3.1  HGB 12.6* 12.5* --  HCT 36.9* 37.6* --  MCV 83.9 85.6 --  PLT 166 167 --    Basename 08/25/12 0318 08/24/12 2144 08/24/12 1610 08/24/12 0853  CKTOTAL -- -- -- --  CKMB -- -- -- --  TROPONINI 0.52* 0.75* 0.53* 0.62*    Basename 08/24/12 1610  HGBA1C 5.9*    Basename 08/25/12 0318  CHOL 152  HDL 29*  LDLCALC 107*  TRIG 79  CHOLHDL 5.2    Basename 08/24/12 1610  TSH <0.008*  T4TOTAL --  T3FREE --  THYROIDAB --    Radiology/Studies: Dg Chest 2 View  08/24/2012  *RADIOLOGY REPORT*  Clinical Data: Chest pain, smoking history  CHEST - 2 VIEW  Comparison: Chest x-ray of 08/12/2012 and CT angio chest of the same date.  Findings: No active infiltrate or effusion is seen.  There is peribronchial thickening consistent with bronchitis, possibly chronic.  Mediastinal contours are  stable.  The heart is within normal limits in size.  No acute bony abnormality is seen.  IMPRESSION: Peribronchial thickening consistent with bronchitis.  No pneumonia or effusion.   Original Report Authenticated By: Dwyane Dee, M.D.    Dg Chest 2 View  08/12/2012  *RADIOLOGY REPORT*  Clinical Data: Chest pain  CHEST - 2 VIEW  Comparison: 08/04/2012  Findings: Lungs are essentially clear.  No pleural effusion or pneumothorax.  Apparent mild patchy left midlung opacity likely reflects normal pulmonary markings, as it is unchanged from multiple prior studies and without correlate on prior CT.  Cardiomediastinal silhouette is within normal limits.  Mild degenerative changes of the visualized thoracolumbar spine.  IMPRESSION: No evidence of acute cardiopulmonary disease.   Original Report Authenticated By: Charline Bills, M.D.    Dg Chest 2 View  08/04/2012   *RADIOLOGY REPORT*  Clinical Data: Chest pain.  CHEST - 2 VIEW  Comparison: 11/18/2009  Findings: Previously seen opacity in the central right upper lobe is no longer visualized.  No evidence of acute infiltrate or edema. No evidence of pleural effusion.  Heart size is normal.  There is no hilar or mediastinal lymphadenopathy identified.  IMPRESSION: No active disease.   Original Report Authenticated By: Danae Orleans, M.D.    US Abdomen Complete  08/26/2012  *RADIOLOGY REPORT*  Clinical Data:  Chest pain  COMPLETE ABDOMINAL ULTRASOUND  Comparison:  None.  Findings:  Gallbladder:  Is incompletely distended for a patient who reports adequately fasting prior to the exam.  No intraluminal sludge or debris is suggested.  The gallbladder wall appears upper normal in thickness at 2.8 mm but this is felt to be due to the incomplete distention.  Common bile duct:  Measures 4.5 mm and has a normal appearance  Liver:  Is homogeneous in echotexture with no signs of focal parenchymal abnormality or intrahepatic ductal dilatation  IVC:  The proximal portion is normal  Pancreas:  Is homogeneous in echotexture with no focal parenchymal abnormality seen  Spleen:  Has a sagittal length of 8 cm and a homogeneous echotexture  Right Kidney:  Has a sagittal length of 11.7 cm.  No focal parenchymal abnormality or signs of hydronephrosis are seen  Left Kidney:  Has a sagittal length of 12.8 cm.  No focal parenchymal abnormality or signs of hydronephrosis are seen  Abdominal aorta:  Has a maximal caliber of 2.3 cm with no aneurysmal dilatation evident  IMPRESSION: Incomplete gallbladder distention for a patient appropriately n.p.o. for the study. This is most commonly due to chronic cholecystitis and inadequate fasting but may also be seen with the viral or drug induced intrahepatic cholestasis.  Otherwise unremarkable   Original Report Authenticated By: Rhodia Albright, M.D.    Nm Myocar Multi W/spect W/wall Motion / Ef  08/14/2012   *RADIOLOGY REPORT*  Clinical Data:  51 year old male with chest pain and hypertension.  MYOCARDIAL IMAGING WITH SPECT (REST AND PHARMACOLOGIC-STRESS) GATED LEFT VENTRICULAR WALL MOTION STUDY LEFT VENTRICULAR EJECTION FRACTION  Technique:  Resting myocardial SPECT imaging was initially performed after intravenous administration of radiopharmaceutical. Myocardial SPECT was subsequently performed after additional radiopharmaceutical injection during pharmacologic-stress supervised by the Cardiology staff.  Quantitative gated imaging was also performed to evaluate left ventricular wall motion, and estimate left ventricular ejection fraction.  Radiopharmaceutical:  Tc-49m Myoview 10 mCi at rest and during stress.  Comparison: none  Findings:  Technique: Study is adequate.  Perfusion:  There are no relative decreased counts on stress or rest to  suggest reversible ischemia or infarction.  Wall motion: Septal hypokinesia.  Left ventricular ejection fraction:  Calculated left ventricular ejection fraction =  44%  IMPRESSION:  1.  No reversible ischemia or infarction.  2. Septal hypokinesia  3.  Left ventricular ejection fraction equal 44%   Original Report Authenticated By: Genevive Bi, M.D.    Ct Angio Chest Aortic Dissect W &/or W/o  08/13/2012  *RADIOLOGY REPORT*  Clinical Data:  2 weeks of sternal chest pain shooting to the back and neck.  CT ANGIOGRAPHY CHEST  Technique:  Multidetector CT imaging through the chest, abdomen and pelvis was performed using the standard protocol during bolus administration of intravenous contrast.  Multiplanar reconstructed images including MIPs were obtained and reviewed to evaluate the vascular anatomy.  Contrast: OMNIPAQUE IOHEXOL 350 MG/ML SOLN  Comparison:   10/29/2009 CT chest.  CTA CHEST  Findings:  Unenhanced images demonstrate normal caliber of the thoracic aorta without significant aortic calcification.  No significant coronary artery calcification.   Incidental note of a 19 mm nodule in the left adrenal gland containing fat density consistent with a benign lipoma.  Contrast enhanced images demonstrate stable appearance of soft tissue in the anterior mediastinum which may represent adenopathy or thymic tissue.  Additional mediastinal lymph nodes are not pathologically enlarged.  Normal caliber thoracic aorta.  No evidence of dissection.  Motion artifact in the ascending thoracic aorta.  No pleural effusions.  No pneumothorax.  There is scarring and infiltration centrally in the right middle lung and right upper lung which is improving since the previous study.  Airways appear patent.  Peribronchial thickening consistent with chronic bronchitis.  Minimal emphysematous changes in the apices.  No acute consolidation.  Diffuse prominence of the thyroid gland without focal nodularity.  Normal alignment of the thoracic spine with mild degenerative change.   Review of the MIP images confirms the above findings.  IMPRESSION: No evidence of aortic dissection.  Chronic bronchitic changes with focal scarring centrally in the right middle lung and right upper lung, improved since previous study. Soft tissue density in the anterior mediastinum may represent residual thymus or adenopathy. This appears stable since previous study.  CTA ABDOMEN AND PELVIS  Findings:  Calcification and atherosclerotic changes in the abdominal aorta, common iliac arteries, and external iliac arteries.  No evidence of aneurysm or dissection.  The celiac axis, superior mesenteric artery, single right renal artery, and duplicated left renal arteries appear patent.  The inferior mesenteric artery is patent.  The iliac, external iliac, and internal iliac arteries are patent although there may be some stenosis of the origins of the internal iliac arteries bilaterally.  Sub centimeter cyst in the right lobe of the liver anteriorly.  The liver, spleen, gallbladder, pancreas, kidneys, and retroperitoneal  lymph nodes are otherwise unremarkable.  Fat-containing nodule in the left adrenal gland as previously discussed consistent with adenoma.  The stomach, small bowel, and colon are not abnormally distended.  No free air or free fluid in the abdomen.  No abnormal mesenteric or retroperitoneal fluid collections.  Pelvis:  The prostate gland is not enlarged.  Bladder wall is not thickened.  No inflammatory changes in the sigmoid colon.  The appendix is normal.  Normal alignment of the lumbar vertebra with mild degenerative change.   Review of the MIP images confirms the above findings.  IMPRESSION: Diffuse atherosclerotic changes in the aorta and branch vessels. No evidence of aneurysm or dissection.   Note that imaging of the abdomen and pelvis  was not order was obtained in error.  Imaging and interpretation of the abdomen pelvis is obtained at no charge to the patient.    Original Report Authenticated By: Marlon Pel, M.D.     Echocardiogram: 08/25/12 Normal LV size and systolic function, EF 60-65%. Normal diastolic function. Elevated gradient across the aortic valve but valve opens well. Suspect high flow state in the setting of hyperthyroidism. Normal RV size and systolic function.   Telemetry: NSR   Assessment and Plan    Patient presents with chest pain and mild elevated troponin. No ischemic changes noted on EKG. Multiple CAD risk factors noted including newly diagnosed HTN and Tobacco abuse.   1. 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  - Echo shows improved EF 60-65% after initiation of treatment for hypoerthyroidism.   2. Chronic systolic CHF/NICM, improved after  treatment of hyperthyroidism  3. Hyperthyroidism, free T4 5.5, TSH < 0.008  - Thyroid stimulating Immunoglobulin pending - Appreciate Endo consult - methimasole 60 mg daily as recent exposure to iodinated contrast.  - Outpatient Endocrinology f/u     Please have the patient or nursing staff call [phone] to schedule the following visits at [facility], [address] [employer], [address], [place], [place] [address]:  1. Lab visit in one week for CBC with differential and hepatic function panel.  2. Lab visit in 3 weeks for TSH and free T4 level.  3. Office visit with me (Dr. Sharl Ma) in 3 or 4 weeks.   4.  GERD-PPI and Carafate    GI consulted--EGD Negative, HIDA scan pending.   5. Prediabetes. HBA1C 5.9  DM consult   6. Elevated transaminase level- likely associated with his NSTEMI, will trend.   7. HTN- Betablocker coreg and ACEI lisinopril added. Hold home dose Hyzaar   8. HLD, LDL 107, HDL 29, on statin   9. Tobacco abuse- counsel   10. COPD- Not formally evaluated. Need outpatient pulmonary evaluation.   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  VTE: Heparin  Signed, LI, NA  PGY-2 8:29 AM  Patient examined chart reviewed. EGD negative HIDA pending on beta blocker and  methimasole Transfer to telemetry  [employer]

## 2012-08-27 NOTE — Progress Notes (Signed)
Gastroenterology Progress Note  SUBJECTIVE: Another episode of chest pain last night  OBJECTIVE:  Vital signs in last 24 hours: Temp:  [98.2 F (36.8 C)-98.9 F (37.2 C)] 98.3 F (36.8 C) (11/09 0459) Pulse Rate:  [77-110] 77  (11/09 0459) Resp:  [15-87] 21  (11/09 0459) BP: (96-173)/(25-110) 122/60 mmHg (11/09 0459) SpO2:  [96 %-100 %] 100 % (11/09 0459) Weight:  [218 lb 14.7 oz (99.3 kg)] 218 lb 14.7 oz (99.3 kg) (11/09 0504) Last BM Date: 08/23/12 General:    Black male in NAD Lungs: Respirations even and unlabored, lungs CTA bilaterally Abdomen:  Soft, nontender and nondistended. Normal bowel sounds. Extremities:  Without edema. Neurologic:  Alert and oriented,  grossly normal neurologically. Psych:  Cooperative.   Lab Results:  Basename 08/27/12 0540 08/26/12 0400 08/25/12 0318  WBC 6.5 5.6 5.9  HGB 12.6* 12.5* 13.4  HCT 36.9* 37.6* 40.4  PLT 166 167 186   BMET  Basename 08/25/12 0318  NA 139  K 3.7  CL 102  CO2 28  GLUCOSE 119*  BUN 13  CREATININE 0.86  CALCIUM 10.0   LFT  Basename 08/26/12 0400  PROT 6.9  ALBUMIN 3.2*  AST 25  ALT 65*  ALKPHOS 119*  BILITOT 0.4  BILIDIR 0.1  IBILI 0.3   PT/INR  Basename 08/24/12 1141  LABPROT 13.4  INR 1.03    Studies/Results: US Abdomen Complete  08/26/2012  *RADIOLOGY REPORT*  Clinical Data:  Chest pain  COMPLETE ABDOMINAL ULTRASOUND  Comparison:  None.  Findings:  Gallbladder:  Is incompletely distended for a patient who reports adequately fasting prior to the exam.  No intraluminal sludge or debris is suggested.  The gallbladder wall appears upper normal in thickness at 2.8 mm but this is felt to be due to the incomplete distention.  Common bile duct:  Measures 4.5 mm and has a normal appearance  Liver:  Is homogeneous in echotexture with no signs of focal parenchymal abnormality or intrahepatic ductal dilatation  IVC:  The proximal portion is normal  Pancreas:  Is homogeneous in echotexture with no  focal parenchymal abnormality seen  Spleen:  Has a sagittal length of 8 cm and a homogeneous echotexture  Right Kidney:  Has a sagittal length of 11.7 cm.  No focal parenchymal abnormality or signs of hydronephrosis are seen  Left Kidney:  Has a sagittal length of 12.8 cm.  No focal parenchymal abnormality or signs of hydronephrosis are seen  Abdominal aorta:  Has a maximal caliber of 2.3 cm with no aneurysmal dilatation evident  IMPRESSION: Incomplete gallbladder distention for a patient appropriately n.p.o. for the study. This is most commonly due to chronic cholecystitis and inadequate fasting but may also be seen with the viral or drug induced intrahepatic cholestasis.  Otherwise unremarkable   Original Report Authenticated By: Rhodia Albright, M.D.      ASSESSMENT / PLAN:  1. NSTEMI. Still having intermittent chest pain. EGD was normal. HIDA was normal. Esophageal spasms are still one possibility. He may need a manometry study at some point. Patient is very frustrated at lack of diagnosis for his chest pain. GI cocktails do help to some degree. He would like a prescription to take home.   2. Hyperthyroidism, newly diagnosed. On Tapazole  . LOS: 3 days   Willette Cluster  08/27/2012, 9:25 AM   I have personally taken an interval history, reviewed the chart, and examined the patient. The patient clearly states that pain immediately resolves with a GI cocktail.  The 2 ingredients that may give immediate relief include lidocaine and Maalox.  Response to GI meds strongly suggest a GI origin to pain.  Recommend giving him Maalox only when he has pain. Should he continue to break through then we can adjust his PPI therapy. Continue twice a day therapy and Carafate.  Barbette Hair. Arlyce Dice, MD, [facility]  Gastroenterology [phone]

## 2012-08-28 DIAGNOSIS — F17200 Nicotine dependence, unspecified, uncomplicated: Secondary | ICD-10-CM

## 2012-08-28 DIAGNOSIS — J40 Bronchitis, not specified as acute or chronic: Secondary | ICD-10-CM

## 2012-08-28 DIAGNOSIS — K219 Gastro-esophageal reflux disease without esophagitis: Secondary | ICD-10-CM

## 2012-08-28 DIAGNOSIS — E0590 Thyrotoxicosis, unspecified without thyrotoxic crisis or storm: Secondary | ICD-10-CM

## 2012-08-28 LAB — CBC
Platelets: 154 10*3/uL (ref 150–400)
RBC: 4.42 MIL/uL (ref 4.22–5.81)
WBC: 7.1 10*3/uL (ref 4.0–10.5)

## 2012-08-28 MED ORDER — LISINOPRIL 10 MG PO TABS: 10.0000 mg | ORAL_TABLET | Freq: Every day | ORAL | Status: DC

## 2012-08-28 MED ORDER — PANTOPRAZOLE SODIUM 40 MG PO TBEC: 40.0000 mg | DELAYED_RELEASE_TABLET | Freq: Two times a day (BID) | ORAL | Status: DC

## 2012-08-28 MED ORDER — ASPIRIN 81 MG PO TBEC: 81.0000 mg | DELAYED_RELEASE_TABLET | Freq: Every day | ORAL | Status: DC

## 2012-08-28 MED ORDER — CALCIUM & MAGNESIUM CARBONATES 311-232 MG PO TABS: 1.0000 | ORAL_TABLET | Freq: Every day | ORAL | Status: DC

## 2012-08-28 MED ORDER — SUCRALFATE 1 G PO TABS: 1.0000 g | ORAL_TABLET | Freq: Three times a day (TID) | ORAL | Status: DC

## 2012-08-28 MED ORDER — METHIMAZOLE 10 MG PO TABS: 60.0000 mg | ORAL_TABLET | Freq: Every day | ORAL | Status: DC

## 2012-08-28 MED ORDER — NITROGLYCERIN 0.4 MG SL SUBL: 0.4000 mg | SUBLINGUAL_TABLET | SUBLINGUAL | Status: DC | PRN

## 2012-08-28 MED ORDER — ATORVASTATIN CALCIUM 40 MG PO TABS: 40.0000 mg | ORAL_TABLET | Freq: Every day | ORAL | Status: DC

## 2012-08-28 NOTE — Progress Notes (Signed)
Patient: Draeden Kellman Date of Encounter: 08/28/2012, 9:37 AM Admit date: 08/24/2012     Subjective  CP is resolved.  Denies SOB or any new concerns.  He wants to "go home and watch football and cuss like a drunken sailor like I usually do" on Sundays.  He has been ambulatory without symptoms.   Objective  Physical Exam: Vitals: BP 139/54  Pulse 94  Temp 98.2 F (36.8 C) (Oral)  Resp 18  Ht 6\' 3"  (1.905 m)  Wt 222 lb 1.6 oz (100.744 kg)  BMI 27.76 kg/m2  SpO2 98% General: Well developed, well appearing, in no acute distress. Stresses out  HEENT: Normocephalic, atraumatic. EOMs intact. Sclera nonicteric. Oropharynx clear.  Neck: Supple.  thyroid enlargement noted,  Lungs: Respirations regular and unlabored, CTA bilaterally.  Heart: RRR. S1, S2 present.  Nomurmurs, rubs or gallops  Abdomen: Soft, non-tender, non-distended. BS present x 4 quadrants. No hepatosplenomegaly.  Extremities: No clubbing, cyanosis or edema. DP/PT/Radials 2+ and equal bilaterally.  Psych: Normal affect.  Neuro: Alert and oriented X 3. Moves all extremities spontaneously.  Musculoskeletal: No kyphosis.  Skin: Intact. Warm and dry. No rashes or petechiae in exposed areas   Intake/Output:  Intake/Output Summary (Last 24 hours) at 08/28/12 0937 Last data filed at 08/28/12 0759  Gross per 24 hour  Intake   1560 ml  Output      0 ml  Net   1560 ml    Inpatient Medications:     . aspirin EC  81 mg Oral Daily  . atorvastatin  40 mg Oral q1800  . carvedilol  12.5 mg Oral BID WC  . heparin subcutaneous  5,000 Units Subcutaneous Q8H  . lisinopril  10 mg Oral Daily  . methimazole  60 mg Oral Daily  . pantoprazole  40 mg Oral BID AC  . sodium chloride  3 mL Intravenous Q12H  . sucralfate  1 g Oral TID WC & HS      Labs: No results found for this basename: NA:2,K:2,CL:2,CO2:2,GLUCOSE:2,BUN:2,CREATININE:2,CALCIUM:2,MG:2,PHOS:2 in the last 72 hours  Basename 08/26/12 0400  AST 25  ALT 65*    ALKPHOS 119*  BILITOT 0.4  PROT 6.9  ALBUMIN 3.2*    Basename 08/28/12 0640 08/27/12 0540  WBC 7.1 6.5  NEUTROABS -- --  HGB 12.4* 12.6*  HCT 37.1* 36.9*  MCV 83.9 83.9  PLT 154 166   No results found for this basename: CKTOTAL:4,CKMB:4,TROPONINI:4 in the last 72 hours No results found for this basename: HGBA1C in the last 72 hours No results found for this basename: CHOL,HDL,LDLCALC,TRIG,CHOLHDL in the last 72 hours No results found for this basename: TSH,T4TOTAL,FREET3,T3FREE,THYROIDAB in the last 72 hours  Radiology/Studies: Dg Chest 2 View  08/24/2012  *RADIOLOGY REPORT*  Clinical Data: Chest pain, smoking history  CHEST - 2 VIEW  Comparison: Chest x-ray of 08/12/2012 and CT angio chest of the same date.  Findings: No active infiltrate or effusion is seen.  There is peribronchial thickening consistent with bronchitis, possibly chronic.  Mediastinal contours are stable.  The heart is within normal limits in size.  No acute bony abnormality is seen.  IMPRESSION: Peribronchial thickening consistent with bronchitis.  No pneumonia or effusion.   Original Report Authenticated By: Dwyane Dee, M.D.    Dg Chest 2 View  08/12/2012  *RADIOLOGY REPORT*  Clinical Data: Chest pain  CHEST - 2 VIEW  Comparison: 08/04/2012  Findings: Lungs are essentially clear.  No pleural effusion or pneumothorax.  Apparent mild patchy  left midlung opacity likely reflects normal pulmonary markings, as it is unchanged from multiple prior studies and without correlate on prior CT.  Cardiomediastinal silhouette is within normal limits.  Mild degenerative changes of the visualized thoracolumbar spine.  IMPRESSION: No evidence of acute cardiopulmonary disease.   Original Report Authenticated By: Charline Bills, M.D.    Dg Chest 2 View  08/04/2012  *RADIOLOGY REPORT*  Clinical Data: Chest pain.  CHEST - 2 VIEW  Comparison: 11/18/2009  Findings: Previously seen opacity in the central right upper lobe is no longer  visualized.  No evidence of acute infiltrate or edema. No evidence of pleural effusion.  Heart size is normal.  There is no hilar or mediastinal lymphadenopathy identified.  IMPRESSION: No active disease.   Original Report Authenticated By: Danae Orleans, M.D.    US Abdomen Complete  08/26/2012  *RADIOLOGY REPORT*  Clinical Data:  Chest pain  COMPLETE ABDOMINAL ULTRASOUND  Comparison:  None.  Findings:  Gallbladder:  Is incompletely distended for a patient who reports adequately fasting prior to the exam.  No intraluminal sludge or debris is suggested.  The gallbladder wall appears upper normal in thickness at 2.8 mm but this is felt to be due to the incomplete distention.  Common bile duct:  Measures 4.5 mm and has a normal appearance  Liver:  Is homogeneous in echotexture with no signs of focal parenchymal abnormality or intrahepatic ductal dilatation  IVC:  The proximal portion is normal  Pancreas:  Is homogeneous in echotexture with no focal parenchymal abnormality seen  Spleen:  Has a sagittal length of 8 cm and a homogeneous echotexture  Right Kidney:  Has a sagittal length of 11.7 cm.  No focal parenchymal abnormality or signs of hydronephrosis are seen  Left Kidney:  Has a sagittal length of 12.8 cm.  No focal parenchymal abnormality or signs of hydronephrosis are seen  Abdominal aorta:  Has a maximal caliber of 2.3 cm with no aneurysmal dilatation evident  IMPRESSION: Incomplete gallbladder distention for a patient appropriately n.p.o. for the study. This is most commonly due to chronic cholecystitis and inadequate fasting but may also be seen with the viral or drug induced intrahepatic cholestasis.  Otherwise unremarkable   Original Report Authenticated By: Rhodia Albright, M.D.    Nm Myocar Multi W/spect W/wall Motion / Ef  08/14/2012  *RADIOLOGY REPORT*  Clinical Data:  51 year old male with chest pain and hypertension.  MYOCARDIAL IMAGING WITH SPECT (REST AND PHARMACOLOGIC-STRESS) GATED LEFT  VENTRICULAR WALL MOTION STUDY LEFT VENTRICULAR EJECTION FRACTION  Technique:  Resting myocardial SPECT imaging was initially performed after intravenous administration of radiopharmaceutical. Myocardial SPECT was subsequently performed after additional radiopharmaceutical injection during pharmacologic-stress supervised by the Cardiology staff.  Quantitative gated imaging was also performed to evaluate left ventricular wall motion, and estimate left ventricular ejection fraction.  Radiopharmaceutical:  Tc-55m Myoview 10 mCi at rest and during stress.  Comparison: none  Findings:  Technique: Study is adequate.  Perfusion:  There are no relative decreased counts on stress or rest to suggest reversible ischemia or infarction.  Wall motion: Septal hypokinesia.  Left ventricular ejection fraction:  Calculated left ventricular ejection fraction =  44%  IMPRESSION:  1.  No reversible ischemia or infarction.  2. Septal hypokinesia  3.  Left ventricular ejection fraction equal 44%   Original Report Authenticated By: Genevive Bi, M.D.    Ct Angio Chest Aortic Dissect W &/or W/o  08/13/2012  *RADIOLOGY REPORT*  Clinical Data:  2 weeks of  sternal chest pain shooting to the back and neck.  CT ANGIOGRAPHY CHEST  Technique:  Multidetector CT imaging through the chest, abdomen and pelvis was performed using the standard protocol during bolus administration of intravenous contrast.  Multiplanar reconstructed images including MIPs were obtained and reviewed to evaluate the vascular anatomy.  Contrast: OMNIPAQUE IOHEXOL 350 MG/ML SOLN  Comparison:   10/29/2009 CT chest.  CTA CHEST  Findings:  Unenhanced images demonstrate normal caliber of the thoracic aorta without significant aortic calcification.  No significant coronary artery calcification.  Incidental note of a 19 mm nodule in the left adrenal gland containing fat density consistent with a benign lipoma.  Contrast enhanced images demonstrate stable appearance  of soft tissue in the anterior mediastinum which may represent adenopathy or thymic tissue.  Additional mediastinal lymph nodes are not pathologically enlarged.  Normal caliber thoracic aorta.  No evidence of dissection.  Motion artifact in the ascending thoracic aorta.  No pleural effusions.  No pneumothorax.  There is scarring and infiltration centrally in the right middle lung and right upper lung which is improving since the previous study.  Airways appear patent.  Peribronchial thickening consistent with chronic bronchitis.  Minimal emphysematous changes in the apices.  No acute consolidation.  Diffuse prominence of the thyroid gland without focal nodularity.  Normal alignment of the thoracic spine with mild degenerative change.   Review of the MIP images confirms the above findings.  IMPRESSION: No evidence of aortic dissection.  Chronic bronchitic changes with focal scarring centrally in the right middle lung and right upper lung, improved since previous study. Soft tissue density in the anterior mediastinum may represent residual thymus or adenopathy. This appears stable since previous study.  CTA ABDOMEN AND PELVIS  Findings:  Calcification and atherosclerotic changes in the abdominal aorta, common iliac arteries, and external iliac arteries.  No evidence of aneurysm or dissection.  The celiac axis, superior mesenteric artery, single right renal artery, and duplicated left renal arteries appear patent.  The inferior mesenteric artery is patent.  The iliac, external iliac, and internal iliac arteries are patent although there may be some stenosis of the origins of the internal iliac arteries bilaterally.  Sub centimeter cyst in the right lobe of the liver anteriorly.  The liver, spleen, gallbladder, pancreas, kidneys, and retroperitoneal lymph nodes are otherwise unremarkable.  Fat-containing nodule in the left adrenal gland as previously discussed consistent with adenoma.  The stomach, small bowel, and  colon are not abnormally distended.  No free air or free fluid in the abdomen.  No abnormal mesenteric or retroperitoneal fluid collections.  Pelvis:  The prostate gland is not enlarged.  Bladder wall is not thickened.  No inflammatory changes in the sigmoid colon.  The appendix is normal.  Normal alignment of the lumbar vertebra with mild degenerative change.   Review of the MIP images confirms the above findings.  IMPRESSION: Diffuse atherosclerotic changes in the aorta and branch vessels. No evidence of aneurysm or dissection.   Note that imaging of the abdomen and pelvis was not order was obtained in error.  Imaging and interpretation of the abdomen pelvis is obtained at no charge to the patient.    Original Report Authenticated By: Marlon Pel, M.D.     Echocardiogram: 08/25/12 Normal LV size and systolic function, EF 60-65%. Normal diastolic function. Elevated gradient across the aortic valve but valve opens well. Suspect high flow state in the setting of hyperthyroidism. Normal RV size and systolic function.   Telemetry:  NSR   Assessment and Plan    Patient presents with chest pain and mild elevated troponin. No ischemic changes noted on EKG. Multiple CAD risk factors noted including newly diagnosed HTN and Tobacco abuse.   1. 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 Will treat with beta blockers. - Echo shows improved EF 60-65% after initiation of treatment for hypoerthyroidism.   2. Chronic systolic CHF/NICM, improved after treatment of hyperthyroidism  3. Hyperthyroidism, free T4 5.5, TSH < 0.008  - Thyroid stimulating Immunoglobulin pending - Appreciate Endo consult - methimasole 60 mg daily as recent exposure to iodinated contrast.  - Outpatient Endocrinology f/u     Please have the patient  or nursing staff call [phone] to schedule the following visits at [facility], [address], [employer], [address], [place], [place] [address]:  1. Lab visit in one week for CBC with differential and hepatic function panel.  2. Lab visit in 3 weeks for TSH and free T4 level.  3. Office visit with me (Dr. Sharl Ma) in 3 or 4 weeks.   4.  GERD-PPI and Carafate    GI consulted--EGD Negative, HIDA scan negative He will need outpatient GI follow-up.  5. Prediabetes. HBA1C 5.9  DM consult   6. HTN- Betablocker coreg and ACEI lisinopril added. Hold home dose Hyzaar.  Follow-up with PCP.  7. Tobacco abuse- cessation is strongly advised this am  8. COPD- Not formally evaluated. Need outpatient pulmonary evaluation.    Will discharge to home with close follow-up by Endocrine, primary care, and GI.  Fayrene Fearing Zaiyah Sottile,MD

## 2012-08-28 NOTE — Discharge Summary (Signed)
Discharge Summary   Patient ID: Alejandro Reyes,  MRN: [id number], DOB/AGE: March 31, 1961 51 y.o.  Admit date: 08/24/2012 Discharge date: 08/28/2012  Primary Physician: No primary provider on file. Primary Cardiologist: New to cardiology   Discharge Diagnoses Principal Problem:  *NSTEMI (non-ST elevated myocardial infarction) Active Problems:  Alcohol abuse, in remission  HYPERTENSION  COPD  Tobacco abuse  Systolic CHF, chronic  Hyperthyroidism  GERD (gastroesophageal reflux disease)  Allergies No Known Allergies  Diagnostic Studies/Procedures  PA/LATERAL CHEST X-RAY - 08/24/12  Comparison: Chest x-ray of 08/12/2012 and CT angio chest of the  same date.  Findings: No active infiltrate or effusion is seen. There is  peribronchial thickening consistent with bronchitis, possibly  chronic. Mediastinal contours are stable. The heart is within  normal limits in size. No acute bony abnormality is seen.  IMPRESSION:  Peribronchial thickening consistent with bronchitis. No pneumonia  or effusion.  CARDIAC CATHETERIZATION - 08/24/12  Left main: 20% distal stenosis.  Left Anterior Descending Artery: Large caliber vessel that courses to the apex. The ostium appears to have 20-30% stenosis. The mid vessel has diffuse 10-20% stenosis. There are two small to moderate caliber diagonal branches without focal stenosis.  Circumflex Artery: Large caliber vessel that terminates into a moderate sized obtuse marginal branch. There is mild plaque in the proximal vessel and in the marginal branch.  Right Coronary Artery: Moderate caliber, dominant vessel with 40% mid stenosis just after a small RV marginal branch. Multiple views were taken and this does not appear to be flow limiting.  Left Ventricular Angiogram: LVEF 45% with severe hypokinesis of the antero-apex and infero-apex.  Impression:  1. Mild to moderate non-obstructive CAD  2. Segmental LV systolic dysfunction  3. 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   2D ECHO - 08/25/12  Impressions:  - Normal LV size and systolic function, EF 60-65%. Normal diastolic function. Elevated gradient across the aortic valve but valve opens well. Suspect high flow state in the setting of hyperthyroidism. Normal RV size and systolic Function.  UPPER ENDOSCOPY- 08/26/12  Interpretation: normal EGD.  ABDOMINAL ULTRASOUND - 08/26/12  IMPRESSION:  Incomplete gallbladder distention for a patient appropriately  n.p.o. for the study. This is most commonly due to chronic  cholecystitis and inadequate fasting but may also be seen with the  viral or drug induced intrahepatic cholestasis.  Otherwise unremarkable  HIDA SCAN - 08/27/12  IMPRESSION:  Normal HIDA scan. Patent cystic duct and common bile duct. The  gallbladder fills normally.   History of Present Illness  Alejandro Reyes is a 51yo male admitted to [facility] with the above problem list. He presented to the ED on 11/6 c/o intermittent SSCP, w/o radiation, severe, and resolving w/o intervention. No associated symptoms or relation to exertion. In the ED, troponin returned mildly elevated at 0.62. EKG without ischemic changes. Heparin gtt was started. CXR revealed bronchitis w/o evidence of pneumonia. The week prior, he had been evaluated for chest pain for which he underwent nuc stress testing revealing no ischemia. Given his HPI and evidence of myocardial damage, he was informed, consented and prepped for cardiac catheterization detailed above. This revealed mild-mod nonobstructive dz, LVEF 45% w/ severe HK of antero/inferoapex c/w myopericarditis vs Takotsubo's CM. He tolerated the procedure well, w/o complications and was transferred to CCU in good condition.   Hospital Course   ASA/BB/statin were continued. PPI IV was added. Serial cardiac biomarkers returned minimally  elevated,  peaked at 0.75 and down-trended. He did continue to have intermittent pain for which PRN oxycodone, morphine and maalox relieved. CRP returned mildly elevated. Patient has a history of hyperthyroidism. Thyromegaly noted on exam. TSH was ordered and returned markedly decreased. Thyroid stimulating antibody and T4 returned elevated revealing primary hyperthyroidism. Methimazole was started. Educated on low iodine diet.   2D echo was ordered as above revealing LVEF 60-65%, elevated AV gradient 2/2 high flow state from hyperthyroidism. No epicardial disease noted. GI was consulted for further work-up of GI etiologies to his chest pain, further evidenced by relief with Maalox. He underwent an abdominal US as above revealing incomplete gall bladder emptying c/w chronic cholecystits. HIDA scan was ordered and returned normal. LFTs were ordered and returned elevated, with a subsequent down-trend. An EGD was performed for further work-up and returned normal. Endocrinology was consulted. The recommendation was made to increase methimazole. Given recent iodinated contrast exposure from cath, radioactive iodine and elective thyroidectomy are deferred x 2 months.   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 Additionally, a variety of cardiac manifestations may arise with this as noted by endocrinology. NSTEMI was suspected to arise in the setting of thyrotoxicosis. Outpatient follow-up was recommended. The patient remained stable and was transferred to telemetry. He continued to have intermittent chest pain relieved by Maalox representative of reflux. GI recommended carafate, PPI and PRN liquid antacids. He was evaluated by Dr. Johney Frame today and deemed appropriate for discharge. He will be discharged on the medications below, and a variety of follow-up appointments have been recommended as listed. This information, including  psot-cath instructions, has been clearly outlined in the discharge AVS.   Discharge Vitals:  Blood pressure 139/54, pulse 94, temperature 98.2 F (36.8 C), temperature source Oral, resp. rate 18, height 6\' 3"  (1.905 m), weight 100.744 kg (222 lb 1.6 oz), SpO2 98.00%.   Labs: Recent Labs  [facility] 08/28/12 0640 08/27/12 0540   WBC 7.1 6.5   HGB 12.4* 12.6*   HCT 37.1* 36.9*   MCV 83.9 83.9   PLT 154 166   Lab 08/26/12 0400 08/25/12 0318 08/24/12 0853  NA -- 139 140  K -- 3.7 3.9  CL -- 102 101  CO2 -- 28 31  BUN -- 13 12  CREATININE -- 0.86 0.80  CALCIUM -- 10.0 10.3  PROT 6.9 -- --  BILITOT 0.4 -- --  ALKPHOS 119* -- --  ALT 65* -- --  AST 25 -- --  AMYLASE -- -- --  LIPASE -- -- --  GLUCOSE -- 119* 125*   Disposition:  Discharge Orders    Future Appointments: Provider: Department: Dept Phone: Center:   09/13/2012 2:30 PM Kathleene Hazel, MD [facility] [place]) [phone] [id number]     Follow-up Information    Follow up with [facility]. (As outlined. )    Contact information:   [address], [address] [place] [address] [phone]      Follow up with [name], MD. (Please schedule a follow-up appointment in 1-2 weeks. )    Contact information:   [address] [place] [address] [phone]       Please follow up. (Please establish follow-up with your primary doctor in 1-2 weeks. )       Follow up with [facility]. (In 2-4 weeks. Office will call with an appointment date &  time. )    Contact information:   [address] [place] [address]         Discharge Medications:    Medication List     As of 08/28/2012 11:16 AM    START taking these medications         aspirin 81 MG EC tablet   Take 1 tablet (81 mg total) by mouth daily.      atorvastatin 40 MG tablet   Commonly known as: LIPITOR   Take 1 tablet (40 mg total) by  mouth daily at 6 PM.      calcium & magnesium carbonates 311-232 MG per tablet   Commonly known as: MYLANTA   Take 1 tablet by mouth daily.      lisinopril 10 MG tablet   Commonly known as: PRINIVIL,ZESTRIL   Take 1 tablet (10 mg total) by mouth daily.      methimazole 10 MG tablet   Commonly known as: TAPAZOLE   Take 6 tablets (60 mg total) by mouth daily.      nitroGLYCERIN 0.4 MG SL tablet   Commonly known as: NITROSTAT   Place 1 tablet (0.4 mg total) under the tongue every 5 (five) minutes x 3 doses as needed for chest pain.      pantoprazole 40 MG tablet   Commonly known as: PROTONIX   Take 1 tablet (40 mg total) by mouth 2 (two) times daily before a meal.   Replaces: omeprazole 20 MG capsule      sucralfate 1 G tablet   Commonly known as: CARAFATE   Take 1 tablet (1 g total) by mouth 4 (four) times daily -  with meals and at bedtime.      CONTINUE taking these medications         alum & mag hydroxide-simeth 200-200-20 MG/5ML suspension   Commonly known as: MAALOX/MYLANTA      carvedilol 6.25 MG tablet   Commonly known as: COREG   Take 1 tablet (6.25 mg total) by mouth 2 (two) times daily with a meal.      STOP taking these medications         losartan-hydrochlorothiazide 50-12.5 MG per tablet   Commonly known as: HYZAAR      omeprazole 20 MG capsule   Commonly known as: PRILOSEC   Replaced by: pantoprazole 40 MG tablet          Where to get your medications    These are the prescriptions that you need to pick up. We sent them to a specific pharmacy, so you will need to go there to get them.   [facility] - [place], [place] - [address]    [address] [place] [address]    Phone: [phone]        atorvastatin 40 MG tablet   lisinopril 10 MG tablet   methimazole 10 MG tablet   nitroGLYCERIN 0.4 MG SL tablet   pantoprazole 40 MG tablet   sucralfate 1 G tablet         You may get these medications from any  pharmacy.         calcium & magnesium carbonates 311-232 MG per tablet         Information on where to get these meds is not yet available. Ask your nurse or doctor.         aspirin 81 MG EC tablet  Outstanding Labs/Studies: CBC, LFTs in 1 week; TSH, T4 in 3 weeks  Duration of Discharge Encounter: Greater than 30 minutes including physician time.  Signed, R. Hurman Horn, PA-C 08/28/2012, 11:16 AM   Hillis Range, MD

## 2012-08-28 NOTE — Progress Notes (Signed)
[facility] Progress Note  SUBJECTIVE: feels okay, "at least I know Mylanta will help my pain"  OBJECTIVE:  Vital signs in last 24 hours: Temp:  [98.2 F (36.8 C)-98.5 F (36.9 C)] 98.2 F (36.8 C) (11/10 0442) Pulse Rate:  [42-94] 94  (11/10 0909) Resp:  [18] 18  (11/10 0442) BP: (130-142)/(54-82) 139/54 mmHg (11/10 0909) SpO2:  [98 %-100 %] 98 % (11/10 0442) Weight:  [222 lb 1.6 oz (100.744 kg)-223 lb 1.7 oz (101.2 kg)] 222 lb 1.6 oz (100.744 kg) (11/10 0442) Last BM Date: 08/28/12 General:    Pleasant black male in NAD Heart:  Regular rate and rhythm Lungs: Respirations even and unlabored, lungs CTA bilaterally Abdomen:  Soft, nontender and nondistended. Normal bowel sounds. Extremities:  Without edema. Neurologic:  Alert and oriented,  grossly normal neurologically. Psych:  Cooperative. Normal mood and affect.  I Lab Results:  Basename 08/28/12 0640 08/27/12 0540 08/26/12 0400  WBC 7.1 6.5 5.6  HGB 12.4* 12.6* 12.5*  HCT 37.1* 36.9* 37.6*  PLT 154 166 167   BMET No results found for this basename: NA:3,K:3,CL:3,CO2:3,GLUCOSE:3,BUN:3,CREATININE:3,CALCIUM:3 in the last 72 hours LFT  Basename 08/26/12 0400  PROT 6.9  ALBUMIN 3.2*  AST 25  ALT 65*  ALKPHOS 119*  BILITOT 0.4  BILIDIR 0.1  IBILI 0.3   Studies/Results: Nm Hepatobiliary  08/27/2012  *RADIOLOGY REPORT*  Clinical Data:  Atypical chest pain.  Abnormal ultrasound of the abdomen.  NUCLEAR MEDICINE HEPATOBILIARY IMAGING  Technique:  Sequential images of the abdomen were obtained out to 60 minutes following intravenous administration of radiopharmaceutical.  Radiopharmaceutical:  5.43mCi Tc-103m Choletec  Comparison:  None.  Findings: There is homogeneous uptake of radiotracer in the liver. The gallbladder is evident by 30 minutes.  Counts are present in the small bowel by 25 minutes. Gallbladder fills completely.  IMPRESSION: Normal HIDA  scan.  Patent cystic duct and common bile duct.  The  gallbladder fills normally.   Original Report Authenticated By: Genevive Bi, M.D.      ASSESSMENT / PLAN: 1. NSTEMI. Still having intermittent chest pain. EGD was normal. HIDA was normal. Mylanta / Maalox helps his pain so he will use this at home.  2. Hyperthyroidism, newly diagnosed. On Tapazole    LOS: 4 days   Willette Cluster  08/28/2012, 10:51 AM   I have personally taken an interval history, reviewed the chart, and examined the patient. No further episodes of pain.  OK to d/c from GI standpoint, on BID PPI Rx, carafate, and liquid antancids prn.  I would like to see pt in the office in 2-3 weeks, sooner if necessary.

## 2012-08-29 ENCOUNTER — Encounter (HOSPITAL_COMMUNITY): Payer: Self-pay | Admitting: Gastroenterology

## 2012-08-31 ENCOUNTER — Encounter: Payer: Self-pay | Admitting: Gastroenterology

## 2012-09-09 ENCOUNTER — Telehealth: Payer: Self-pay | Admitting: *Deleted

## 2012-09-09 NOTE — Telephone Encounter (Signed)
Fax received in office from [facility] indicating pt was in for smoking cessation and also reported he has been using NTG frequently.  Pt was instructed to discuss frequent use of NTG at upcoming visit with Dr. Clifton James.  I reviewed fax with Dr. Clifton James. Per Dr. Clifton James pt with minimal CAD . Chest pain felt to be GI related or pericarditis.  I spoke with pt and gave him information from Dr. Clifton James and made sure he is aware of upcoming appt with Dr. Clifton James on September 13, 2012. Pt is planning on being here for this appt.  Pt aware it is OK to use NTG if needed.

## 2012-09-13 ENCOUNTER — Encounter: Payer: Self-pay | Admitting: Cardiovascular Disease

## 2012-09-13 ENCOUNTER — Ambulatory Visit (INDEPENDENT_AMBULATORY_CARE_PROVIDER_SITE_OTHER): Payer: 59 | Admitting: Cardiovascular Disease

## 2012-09-13 ENCOUNTER — Encounter: Payer: Self-pay | Admitting: *Deleted

## 2012-09-13 VITALS — BP 129/75 | HR 94 | Ht 74.0 in | Wt 224.0 lb

## 2012-09-13 DIAGNOSIS — I2510 Atherosclerotic heart disease of native coronary artery without angina pectoris: Secondary | ICD-10-CM

## 2012-09-13 DIAGNOSIS — Z720 Tobacco use: Secondary | ICD-10-CM

## 2012-09-13 DIAGNOSIS — F17200 Nicotine dependence, unspecified, uncomplicated: Secondary | ICD-10-CM

## 2012-09-13 DIAGNOSIS — I309 Acute pericarditis, unspecified: Secondary | ICD-10-CM

## 2012-09-13 NOTE — Progress Notes (Signed)
History of Present Illness: 51 yo male with history of mild non-obstructive CAD, HTN, GERD, COPD, tobacco abuse here today for hospital follow up. He was admitted 08/24/12 to [facility] by Dr Gala Romney with chest pain. His troponin was  0.62.  EKG was unremarkable. He had just been discharged the prior week on 08/15/12 after evaluation of chest pain. He had negative cardiac enzymes and no ischemia by EKG. His Myoview showed EF of 44% with Septal hypokinesia but no signs of reversible ischemia. ETT showed non diagnostic EKG. Chest/Abdomen/pelvc CT negative for PE or dissection. Diffuse atherosclerotic changes in the aorta and branch vessels. Cardiac cath on 08/24/12 with mild disease in the left main, LAD and RCA but no flow limiting lesions noted (see below). LVEF 45% with apical hypokinesis.  It was felt that he may have myopericarditis or stress induced cardiomyopathy. He was also seen in the hospital by Endocrinology, Dr. Talmage Coin, and was started on methimazole. He was seen by GI and EGD was normal, HIDA scan was normal. His LVEF was noted to be 65% by echo before discharge home.   He is here today for followup. Feeling much better. Energy normal. Rare chest pains. No SOB.   Primary Care Physician: None  Last Lipid Profile:Lipid Panel     Component Value Date/Time   CHOL 152 08/25/2012 0318   TRIG 79 08/25/2012 0318   HDL 29* 08/25/2012 0318   CHOLHDL 5.2 08/25/2012 0318   VLDL 16 08/25/2012 0318   LDLCALC 107* 08/25/2012 0318     Past Medical History  Diagnosis Date  . Hypertension   . Chronic systolic CHF (congestive heart failure), NYHA class 1   . Tobacco abuse   . COPD (chronic obstructive pulmonary disease)   . GERD (gastroesophageal reflux disease)   . Alcohol abuse, in remission   . Hyperthyroidism   . CAD (coronary artery disease)   . Pericarditis     Past Surgical History  Procedure Date  . Tonsillectomy   . Esophagogastroduodenoscopy 08/26/2012    Procedure:  ESOPHAGOGASTRODUODENOSCOPY (EGD);  Surgeon: Louis Meckel, MD;  Location: [facility] ENDOSCOPY;  Service: Endoscopy;  Laterality: N/A;    Current Outpatient Prescriptions  Medication Sig Dispense Refill  . aspirin EC 81 MG EC tablet Take 1 tablet (81 mg total) by mouth daily.      Marland Kitchen atorvastatin (LIPITOR) 40 MG tablet Take 1 tablet (40 mg total) by mouth daily at 6 PM.  30 tablet  3  . carvedilol (COREG) 6.25 MG tablet Take 1 tablet (6.25 mg total) by mouth 2 (two) times daily with a meal.  60 tablet  0  . lisinopril (PRINIVIL,ZESTRIL) 10 MG tablet Take 1 tablet (10 mg total) by mouth daily.  30 tablet  3  . losartan-hydrochlorothiazide (HYZAAR) 50-12.5 MG per tablet Take 1 tablet by mouth daily.      . methimazole (TAPAZOLE) 10 MG tablet Take 6 tablets (60 mg total) by mouth daily.  180 tablet  3  . nitroGLYCERIN (NITROSTAT) 0.4 MG SL tablet Place 1 tablet (0.4 mg total) under the tongue every 5 (five) minutes x 3 doses as needed for chest pain.  25 tablet  3  . NON FORMULARY Nicotine patch      . pantoprazole (PROTONIX) 40 MG tablet Take 1 tablet (40 mg total) by mouth 2 (two) times daily before a meal.  60 tablet  3  . sucralfate (CARAFATE) 1 G tablet Take 1 tablet (1 g total) by mouth 4 (  four) times daily -  with meals and at bedtime.  120 tablet  1    No Known Allergies  History   Social History  . Marital Status: Single    Spouse Name: N/A    Number of Children: N/A  . Years of Education: N/A   Occupational History  . Not on file.   Social History Main Topics  . Smoking status: Current Every Day Smoker    Types: Cigarettes  . Smokeless tobacco: Not on file  . Alcohol Use: No  . Drug Use: No  . Sexually Active:    Other Topics Concern  . Not on file   Social History Narrative  . No narrative on file    Family History  Problem Relation Age of Onset  . Hypertension Father   . Cancer - Other Father     SCCa of Oropharynx    Review of Systems:  As stated in the HPI and  otherwise negative.   BP 129/75  Pulse 94  Ht 6\' 2"  (1.88 m)  Wt 224 lb (101.606 kg)  BMI 28.76 kg/m2  Physical Examination: General: Well developed, well nourished, NAD HEENT: OP clear, mucus membranes moist SKIN: warm, dry. No rashes. Neuro: No focal deficits Musculoskeletal: Muscle strength 5/5 all ext Psychiatric: Mood and affect normal Neck: No JVD, no carotid bruits, no thyromegaly, no lymphadenopathy. Lungs:Clear bilaterally, no wheezes, rhonci, crackles Cardiovascular: Regular rate and rhythm. No murmurs, gallops or rubs. Abdomen:Soft. Bowel sounds present. Non-tender.  Extremities: No lower extremity edema. Pulses are 2 + in the bilateral DP/PT.  Cardiac cath 08/24/12:  Left main: 20% distal stenosis.  Left Anterior Descending Artery: Large caliber vessel that courses to the apex. The ostium appears to have 20-30% stenosis. The mid vessel has diffuse 10-20% stenosis. There are two small to moderate caliber diagonal branches without focal stenosis.  Circumflex Artery: Large caliber vessel that terminates into a moderate sized obtuse marginal branch. There is mild plaque in the proximal vessel and in the marginal branch.  Right Coronary Artery: Moderate caliber, dominant vessel with 40% mid stenosis just after a small RV marginal branch. Multiple views were taken and this does not appear to be flow limiting.  Left Ventricular Angiogram: LVEF 45% with severe hypokinesis of the antero-apex and infero-apex.   Echo 08/25/12:  Left ventricle: The cavity size was normal. Wall thickness was increased in a pattern of mild LVH. Systolic function was normal. The estimated ejection fraction was in the range of 60% to 65%. Wall motion was normal; there were no regional wall motion abnormalities. Left ventricular diastolic function parameters were normal. - Aortic valve: There was no stenosis. There was a mildly elevated gradient across the aortic valve but the valve opened normally.  Suspect high flow state due to hyperthyroidism. Mean gradient: 12mm Hg (S). Valve area: 2.65cm^2(VTI). Valve area: 2.47cm^2 (Vmax). - Mitral valve: No significant regurgitation. - Right ventricle: The cavity size was normal. Systolic function was normal. - Pulmonary arteries: No complete TR doppler jet so unable to estimate PA systolic pressure. - Inferior vena cava: The vessel was normal in size; the respirophasic diameter changes were in the normal range (= 50%); findings are consistent with normal central venous pressure. Impressions:  - Normal LV size and systolic function, EF 60-65%. Normal diastolic function. Elevated gradient across the aortic valve but valve opens well. Suspect high flow state in the setting of hyperthyroidism. Normal RV size and systolic function.  EKG: NSR, rate 94 bpm. LVH. Diffuse  ST elevation c/w pericarditis or early repolarization.   Assessment and Plan:   1. CAD: He was found to have mild CAD by cath 08/24/12. None of the lesions were flow limiting. His chest pain was felt to be secondary to myopericarditis. Continue ASA, statin, beta blocker, ARB. Will stop Lisinopril since he was discharged on an ARB as well. Return to work.   2. Myopericarditis: Resolving. He only has occasional chest pains.   3. Tobacco abuse: He has stopped smoking.   4. Hyperthyroidism: Per Endocrinology. F/U planned for 10/03/12.   REfer to primary care

## 2012-09-13 NOTE — Patient Instructions (Signed)
Your physician wants you to follow-up in:  6 months.  You will receive a reminder letter in the mail two months in advance. If you don't receive a letter, please call our office to schedule the follow-up appointment.  You have been referred to  Dr. Yetta Barre for primary care.   Your physician has recommended you make the following change in your medication:  Stop Lisinopril

## 2012-09-15 ENCOUNTER — Ambulatory Visit (HOSPITAL_COMMUNITY): Payer: 59

## 2012-09-16 ENCOUNTER — Ambulatory Visit (HOSPITAL_COMMUNITY): Payer: 59

## 2012-09-22 ENCOUNTER — Encounter (HOSPITAL_COMMUNITY)
Admission: RE | Admit: 2012-09-22 | Discharge: 2012-09-22 | Disposition: A | Discharge status: Home / Self Care | Payer: 59 | Source: Ambulatory Visit | Attending: Cardiovascular Disease | Admitting: Cardiovascular Disease

## 2012-09-22 DIAGNOSIS — I319 Disease of pericardium, unspecified: Secondary | ICD-10-CM | POA: Insufficient documentation

## 2012-09-22 DIAGNOSIS — I214 Non-ST elevation (NSTEMI) myocardial infarction: Secondary | ICD-10-CM | POA: Insufficient documentation

## 2012-09-22 DIAGNOSIS — I5022 Chronic systolic (congestive) heart failure: Secondary | ICD-10-CM | POA: Insufficient documentation

## 2012-09-22 DIAGNOSIS — Z5189 Encounter for other specified aftercare: Secondary | ICD-10-CM | POA: Insufficient documentation

## 2012-09-22 NOTE — Progress Notes (Signed)
Cardiac Rehab Medication Review by a Pharmacist  Does the patient  feel that his/her medications are working for him/her?  yes  Has the patient been experiencing any side effects to the medications prescribed?  no  Does the patient measure his/her own blood pressure or blood glucose at home?  no   Does the patient have any problems obtaining medications due to transportation or finances?   no  Understanding of regimen: excellent Understanding of indications: excellent Potential of compliance: excellent    Pharmacist comments: none    Laurence Slate 09/22/2012 8:30 AM

## 2012-09-26 ENCOUNTER — Encounter (HOSPITAL_COMMUNITY)
Admission: RE | Admit: 2012-09-26 | Discharge: 2012-09-26 | Disposition: A | Discharge status: Home / Self Care | Payer: 59 | Source: Ambulatory Visit | Attending: Cardiovascular Disease | Admitting: Cardiovascular Disease

## 2012-09-26 ENCOUNTER — Encounter (HOSPITAL_COMMUNITY): Payer: Self-pay

## 2012-09-26 NOTE — Progress Notes (Signed)
Pt oriented to exercise equipment and routine.  Understanding verbalized.  Asymptomatic,  VSS, telemetry-sinus rhythm.  Pt oriented to exercise equipment and routine.  Understanding verbalized.

## 2012-09-28 ENCOUNTER — Encounter (HOSPITAL_COMMUNITY)
Admission: RE | Admit: 2012-09-28 | Discharge: 2012-09-28 | Disposition: A | Discharge status: Home / Self Care | Payer: 59 | Source: Ambulatory Visit | Attending: Cardiovascular Disease | Admitting: Cardiovascular Disease

## 2012-09-30 ENCOUNTER — Encounter (HOSPITAL_COMMUNITY)
Admission: RE | Admit: 2012-09-30 | Discharge: 2012-09-30 | Disposition: A | Discharge status: Home / Self Care | Payer: 59 | Source: Ambulatory Visit | Attending: Cardiovascular Disease | Admitting: Cardiovascular Disease

## 2012-10-03 ENCOUNTER — Ambulatory Visit: Payer: 59 | Admitting: Gastroenterology

## 2012-10-03 ENCOUNTER — Encounter (HOSPITAL_COMMUNITY)
Admission: RE | Admit: 2012-10-03 | Discharge: 2012-10-03 | Disposition: A | Discharge status: Home / Self Care | Payer: 59 | Source: Ambulatory Visit | Attending: Cardiovascular Disease | Admitting: Cardiovascular Disease

## 2012-10-05 ENCOUNTER — Encounter (HOSPITAL_COMMUNITY)
Admission: RE | Admit: 2012-10-05 | Discharge: 2012-10-05 | Disposition: A | Discharge status: Home / Self Care | Payer: 59 | Source: Ambulatory Visit | Attending: Cardiovascular Disease | Admitting: Cardiovascular Disease

## 2012-10-05 NOTE — Progress Notes (Signed)
Reviewed quality of life questionnaire with pt.  Pt reports he would like to pursue his education further however has had setbacks inhibiting his ability to do so. Pt given vocational rehab application and instructed to return to cardiac rehab staff once completed. Pt very expresses excitement with participating in vocational rehab program.   Pt also concerned about his health and difficulty with diagnosis initially.  Otherwise pt reports overall good quality of life and exhibits positive coping skills.

## 2012-10-07 ENCOUNTER — Encounter (HOSPITAL_COMMUNITY)
Admission: RE | Admit: 2012-10-07 | Discharge: 2012-10-07 | Disposition: A | Discharge status: Home / Self Care | Payer: 59 | Source: Ambulatory Visit | Attending: Cardiovascular Disease | Admitting: Cardiovascular Disease

## 2012-10-10 ENCOUNTER — Encounter (HOSPITAL_COMMUNITY)
Admission: RE | Admit: 2012-10-10 | Discharge: 2012-10-10 | Disposition: A | Discharge status: Home / Self Care | Payer: 59 | Source: Ambulatory Visit | Attending: Cardiovascular Disease | Admitting: Cardiovascular Disease

## 2012-10-14 ENCOUNTER — Encounter (HOSPITAL_COMMUNITY)
Admission: RE | Admit: 2012-10-14 | Discharge: 2012-10-14 | Disposition: A | Discharge status: Home / Self Care | Payer: 59 | Source: Ambulatory Visit | Attending: Cardiovascular Disease | Admitting: Cardiovascular Disease

## 2012-10-17 ENCOUNTER — Encounter (HOSPITAL_COMMUNITY)
Admission: RE | Admit: 2012-10-17 | Discharge: 2012-10-17 | Disposition: A | Discharge status: Home / Self Care | Payer: 59 | Source: Ambulatory Visit | Attending: Cardiovascular Disease | Admitting: Cardiovascular Disease

## 2012-10-19 ENCOUNTER — Encounter (HOSPITAL_COMMUNITY): Payer: 59

## 2012-10-21 ENCOUNTER — Telehealth: Payer: Self-pay | Admitting: Gastroenterology

## 2012-10-21 ENCOUNTER — Encounter (HOSPITAL_COMMUNITY)
Admission: RE | Admit: 2012-10-21 | Discharge: 2012-10-21 | Disposition: A | Discharge status: Home / Self Care | Payer: 59 | Source: Ambulatory Visit | Attending: Cardiovascular Disease | Admitting: Cardiovascular Disease

## 2012-10-21 DIAGNOSIS — Z5189 Encounter for other specified aftercare: Secondary | ICD-10-CM | POA: Insufficient documentation

## 2012-10-21 DIAGNOSIS — I319 Disease of pericardium, unspecified: Secondary | ICD-10-CM | POA: Insufficient documentation

## 2012-10-21 DIAGNOSIS — I5022 Chronic systolic (congestive) heart failure: Secondary | ICD-10-CM | POA: Insufficient documentation

## 2012-10-21 DIAGNOSIS — I214 Non-ST elevation (NSTEMI) myocardial infarction: Secondary | ICD-10-CM | POA: Insufficient documentation

## 2012-10-21 NOTE — Telephone Encounter (Signed)
Message copied by Arna Snipe on Fri Oct 21, 2012  8:39 AM ------      Message from: Marlowe Kays      Created: Mon Oct 03, 2012  9:45 AM      Regarding: RE: Had appt at [facility] that ran late today 10-03-12       No charge      ----- Message -----         From: Arna Snipe         Sent: 10/03/2012   9:40 AM           To: Marlowe Kays, CMA      Subject: Had appt at [facility] that ran late today 1#            Could not make it this am for his appt w/Dr Lois Huxley

## 2012-10-24 ENCOUNTER — Encounter (HOSPITAL_COMMUNITY)
Admission: RE | Admit: 2012-10-24 | Discharge: 2012-10-24 | Disposition: A | Discharge status: Home / Self Care | Payer: 59 | Source: Ambulatory Visit | Attending: Cardiovascular Disease | Admitting: Cardiovascular Disease

## 2012-10-24 NOTE — Progress Notes (Signed)
Reviewed home exercise with pt today.  Pt plans to walk daily for exercise.  Reviewed THR, pulse, RPE, sign and symptoms, NTG use, and when to call 911 or MD.  Pt voiced understanding.

## 2012-10-26 ENCOUNTER — Encounter (HOSPITAL_COMMUNITY)
Admission: RE | Admit: 2012-10-26 | Discharge: 2012-10-26 | Disposition: A | Discharge status: Home / Self Care | Payer: 59 | Source: Ambulatory Visit | Attending: Cardiovascular Disease | Admitting: Cardiovascular Disease

## 2012-10-27 ENCOUNTER — Ambulatory Visit (INDEPENDENT_AMBULATORY_CARE_PROVIDER_SITE_OTHER): Payer: 59 | Admitting: Gastroenterology

## 2012-10-27 ENCOUNTER — Encounter: Payer: Self-pay | Admitting: Gastroenterology

## 2012-10-27 VITALS — BP 150/80 | HR 76 | Ht 73.0 in | Wt 238.2 lb

## 2012-10-27 DIAGNOSIS — R079 Chest pain, unspecified: Secondary | ICD-10-CM

## 2012-10-27 DIAGNOSIS — K219 Gastro-esophageal reflux disease without esophagitis: Secondary | ICD-10-CM

## 2012-10-27 DIAGNOSIS — Z1211 Encounter for screening for malignant neoplasm of colon: Secondary | ICD-10-CM | POA: Insufficient documentation

## 2012-10-27 MED ORDER — NA SULFATE-K SULFATE-MG SULF 17.5-3.13-1.6 GM/177ML PO SOLN: 1.0000 | Freq: Once | ORAL | Status: DC

## 2012-10-27 NOTE — Assessment & Plan Note (Signed)
This was felt secondary to myopericarditis. Patient is pain-free

## 2012-10-27 NOTE — Patient Instructions (Addendum)
D/C Carafate Reduce Protonix  You have been scheduled for a colonoscopy with propofol. Please follow written instructions given to you at your visit today.  Please pick up your prep kit at the pharmacy within the next 1-3 days. If you use inhalers (even only as needed) or a CPAP machine, please bring them with you on the day of your procedure.

## 2012-10-27 NOTE — Assessment & Plan Note (Signed)
Plan screening colonoscopy 

## 2012-10-27 NOTE — Progress Notes (Signed)
History of Present Illness: Pleasant 52 year old Afro-American male seen during a hospitalization in November, 2013 for chest pain. Upper endoscopy was negative. It was ultimately felt that he had pain from myopericarditis. He currently is entirely pain-free. He has no GI complaints including pyrosis or dysphagia. He is exercising regularly and claims to be eating more properly and has "never felt better in his life"    Past Medical History  Diagnosis Date  . Hypertension   . Chronic systolic CHF (congestive heart failure), NYHA class 1   . Tobacco abuse   . COPD (chronic obstructive pulmonary disease)   . GERD (gastroesophageal reflux disease)   . Alcohol abuse, in remission   . Hyperthyroidism   . CAD (coronary artery disease)   . Pericarditis   . Pneumonia    Past Surgical History  Procedure Date  . Tonsillectomy   . Esophagogastroduodenoscopy 08/26/2012    Procedure: ESOPHAGOGASTRODUODENOSCOPY (EGD);  Surgeon: Louis Meckel, MD;  Location: [facility] ENDOSCOPY;  Service: Endoscopy;  Laterality: N/A;  . Appendectomy 1976   family history includes Cancer - Other in his father and Hypertension in his father. Current Outpatient Prescriptions  Medication Sig Dispense Refill  . aspirin EC 81 MG EC tablet Take 1 tablet (81 mg total) by mouth daily.      Marland Kitchen atorvastatin (LIPITOR) 40 MG tablet Take 1 tablet (40 mg total) by mouth daily at 6 PM.  30 tablet  3  . carvedilol (COREG) 6.25 MG tablet Take 1 tablet (6.25 mg total) by mouth 2 (two) times daily with a meal.  60 tablet  0  . losartan-hydrochlorothiazide (HYZAAR) 50-12.5 MG per tablet Take 1 tablet by mouth daily.      . methimazole (TAPAZOLE) 10 MG tablet Take 6 tablets (60 mg total) by mouth daily.  180 tablet  3  . nitroGLYCERIN (NITROSTAT) 0.4 MG SL tablet Place 1 tablet (0.4 mg total) under the tongue every 5 (five) minutes x 3 doses as needed for chest pain.  25 tablet  3  . NON FORMULARY Nicotine patch      . pantoprazole (PROTONIX)  40 MG tablet Take 1 tablet (40 mg total) by mouth 2 (two) times daily before a meal.  60 tablet  3  . sucralfate (CARAFATE) 1 G tablet Take 1 tablet (1 g total) by mouth 4 (four) times daily -  with meals and at bedtime.  120 tablet  1   Allergies as of 10/27/2012  . (No Known Allergies)    reports that he has quit smoking. His smoking use included Cigarettes. He has never used smokeless tobacco. He reports that he does not drink alcohol or use illicit drugs.     Review of Systems: Pertinent positive and negative review of systems were noted in the above HPI section. All other review of systems were otherwise negative.  Vital signs were reviewed in today's medical record Physical Exam: General: Well developed , well nourished, no acute distress Head: Normocephalic and atraumatic Eyes:  sclerae anicteric, EOMI Ears: Normal auditory acuity Mouth: No deformity or lesions Neck: Supple, no masses or thyromegaly Lungs: Clear throughout to auscultation Heart: Regular rate and rhythm; no murmurs, rubs or bruits Abdomen: Soft, non tender and non distended. No masses, hepatosplenomegaly or hernias noted. Normal Bowel sounds Rectal:deferred Musculoskeletal: Symmetrical with no gross deformities  Skin: No lesions on visible extremities Pulses:  Normal pulses noted Extremities: No clubbing, cyanosis, edema or deformities noted Neurological: Alert oriented x 4, grossly nonfocal Cervical Nodes:  No significant cervical adenopathy Inguinal Nodes: No significant inguinal adenopathy Psychological:  Alert and cooperative. Normal mood and affect

## 2012-10-27 NOTE — Assessment & Plan Note (Signed)
Patient has no symptoms referable to reflux while taking both Carafate and Protonix.  Endoscopy was negative.   I'm not convinced that he has active reflux or the need for therapy.  Recommendations #1 DC Carafate #2 try reducing or discontinuing protonix

## 2012-10-28 ENCOUNTER — Encounter (HOSPITAL_COMMUNITY)
Admission: RE | Admit: 2012-10-28 | Discharge: 2012-10-28 | Disposition: A | Discharge status: Home / Self Care | Payer: 59 | Source: Ambulatory Visit | Attending: Cardiovascular Disease | Admitting: Cardiovascular Disease

## 2012-10-31 ENCOUNTER — Encounter (HOSPITAL_COMMUNITY)
Admission: RE | Admit: 2012-10-31 | Discharge: 2012-10-31 | Disposition: A | Discharge status: Home / Self Care | Payer: 59 | Source: Ambulatory Visit | Attending: Cardiovascular Disease | Admitting: Cardiovascular Disease

## 2012-11-02 ENCOUNTER — Encounter (HOSPITAL_COMMUNITY)
Admission: RE | Admit: 2012-11-02 | Discharge: 2012-11-02 | Disposition: A | Discharge status: Home / Self Care | Payer: 59 | Source: Ambulatory Visit | Attending: Cardiovascular Disease | Admitting: Cardiovascular Disease

## 2012-11-02 ENCOUNTER — Ambulatory Visit: Payer: 59 | Admitting: Internal Medicine

## 2012-11-02 DIAGNOSIS — Z0289 Encounter for other administrative examinations: Secondary | ICD-10-CM

## 2012-11-04 ENCOUNTER — Encounter (HOSPITAL_COMMUNITY): Payer: 59

## 2012-11-07 ENCOUNTER — Encounter (HOSPITAL_COMMUNITY)
Admission: RE | Admit: 2012-11-07 | Discharge: 2012-11-07 | Disposition: A | Discharge status: Home / Self Care | Payer: 59 | Source: Ambulatory Visit | Attending: Cardiovascular Disease | Admitting: Cardiovascular Disease

## 2012-11-09 ENCOUNTER — Encounter (HOSPITAL_COMMUNITY)
Admission: RE | Admit: 2012-11-09 | Discharge: 2012-11-09 | Disposition: A | Discharge status: Home / Self Care | Payer: 59 | Source: Ambulatory Visit | Attending: Cardiovascular Disease | Admitting: Cardiovascular Disease

## 2012-11-10 ENCOUNTER — Telehealth: Payer: Self-pay | Admitting: *Deleted

## 2012-11-10 NOTE — Telephone Encounter (Signed)
I called pt to discuss information below. Left message to call back  Below copied from message from Dr. Clifton James  RE: Cardiac Rehab      Kathleene Hazel, MD     Sent: Thu November 10, 2012  3:37 PM    To: Robyne Peers, RN    Cc: Dossie Arbour, RN       Prather Failla    MRN: [id number] DOB: 1960-12-04    Pt Home: [phone]         Message     Thanks Joann.     Pat, Can we increase his Hyzaar to 100/25 mg once daily? Thanks, chris         ----- Message -----       From: Robyne Peers, RN       Sent: 11/09/2012   4:54 PM         To: Kathleene Hazel, MD    Subject: Cardiac Rehab                                         Dr. Clifton James,         Pt is interested in running.  He states he has been running some on weekends at home.  His current met level is 4.9.  Physically and mentally he is ready.          His vital signs are a little more concerning.  His BP trend has been 160-170/90-100's with exercise and HR -140's ( his 80% THR=134).  I faxed over rehab reports for your review earlier this week.   He does not always take his medication before rehab because he works nights.   We have also discussed decreasing sodium intake.         Do you think he is ready to start running?  If so, is it ok to increase his THR?  What do you think of his BP?           Thank you,    Deveron Furlong, RN    Cardiac Pulmonary Rehab

## 2012-11-11 ENCOUNTER — Encounter (HOSPITAL_COMMUNITY)
Admission: RE | Admit: 2012-11-11 | Discharge: 2012-11-11 | Disposition: A | Discharge status: Home / Self Care | Payer: 59 | Source: Ambulatory Visit | Attending: Cardiovascular Disease | Admitting: Cardiovascular Disease

## 2012-11-11 ENCOUNTER — Telehealth: Payer: Self-pay | Admitting: *Deleted

## 2012-11-11 MED ORDER — LOSARTAN POTASSIUM-HCTZ 50-12.5 MG PO TABS: 1.0000 | ORAL_TABLET | Freq: Every day | ORAL | Status: DC

## 2012-11-11 NOTE — Telephone Encounter (Signed)
Thanks Sherri, We did not know he was not taking his meds.

## 2012-11-11 NOTE — Telephone Encounter (Signed)
Received phone call from [place] at [facility] suggesting increasing hyzaar to 100/25 daily. Elizabeth asked the patient how he was taking his medication and he stated he had been out of meds for 3 weeks. The last refill on file was from the hospital so there was no response when refill was requested.  I gave a refill at regular dose 50/12.5 and made a BP check on 2/11 at 11:00. Told him at this time we can evaluate his BP and if he does need more then we can increase at that time.

## 2012-11-14 ENCOUNTER — Encounter (HOSPITAL_COMMUNITY)
Admission: RE | Admit: 2012-11-14 | Discharge: 2012-11-14 | Disposition: A | Discharge status: Home / Self Care | Payer: 59 | Source: Ambulatory Visit | Attending: Cardiovascular Disease | Admitting: Cardiovascular Disease

## 2012-11-16 ENCOUNTER — Encounter (HOSPITAL_COMMUNITY): Payer: 59

## 2012-11-16 ENCOUNTER — Other Ambulatory Visit (HOSPITAL_COMMUNITY): Payer: Self-pay | Admitting: Physician Assistant

## 2012-11-16 NOTE — Telephone Encounter (Signed)
See phone note dated November 12, 2011 for documentation regarding this.

## 2012-11-18 ENCOUNTER — Encounter (HOSPITAL_COMMUNITY)
Admission: RE | Admit: 2012-11-18 | Discharge: 2012-11-18 | Disposition: A | Discharge status: Home / Self Care | Payer: 59 | Source: Ambulatory Visit | Attending: Cardiovascular Disease | Admitting: Cardiovascular Disease

## 2012-11-21 ENCOUNTER — Encounter (HOSPITAL_COMMUNITY): Payer: 59

## 2012-11-23 ENCOUNTER — Encounter (HOSPITAL_COMMUNITY)
Admission: RE | Admit: 2012-11-23 | Discharge: 2012-11-23 | Disposition: A | Discharge status: Home / Self Care | Payer: 59 | Source: Ambulatory Visit | Attending: Cardiovascular Disease | Admitting: Cardiovascular Disease

## 2012-11-23 DIAGNOSIS — Z5189 Encounter for other specified aftercare: Secondary | ICD-10-CM | POA: Insufficient documentation

## 2012-11-23 DIAGNOSIS — I214 Non-ST elevation (NSTEMI) myocardial infarction: Secondary | ICD-10-CM | POA: Insufficient documentation

## 2012-11-23 DIAGNOSIS — I5022 Chronic systolic (congestive) heart failure: Secondary | ICD-10-CM | POA: Insufficient documentation

## 2012-11-23 DIAGNOSIS — I319 Disease of pericardium, unspecified: Secondary | ICD-10-CM | POA: Insufficient documentation

## 2012-11-23 NOTE — Progress Notes (Signed)
Alejandro Reyes 52 y.o. male Nutrition Note Spoke with pt.  Nutrition Plan and Nutrition Survey goals reviewed with pt. There were many ways pt could make his eating habits healthier on admission. Pt has made many changes to his diet since starting rehab. Pt appears to be  following Step 1 of the Therapeutic Lifestyle Changes diet. Pt is not currently interested in losing wt. Per discussion, pt UBW before MI was 264 lbs (120 kg). Pt's current wt is down 11.5 kg (25.3 lbs) from his UBW. Pt has quit using tobacco products recently.  Wt maintenance appropriate at this time. According to pt's last A1c, pt is pre-diabetic. Pre-diabetes discussed. Pt expressed understanding of the above information reviewed. Pt aware of nutrition education classes offered and has attended the Nutrition I classes.  Nutrition Diagnosis   Food-and nutrition-related knowledge deficit related to lack of exposure to information as related to diagnosis of: ? CVD ? Pre-DM (A1c 5.9)    Obesity related to excessive energy intake as evidenced by a BMI of 30.9  Nutrition RX/ Estimated Daily Nutrition Needs changed for: wt maintenance  2750-2950 Kcal, 90-95 gm fat, 18-20 gm sat fat, 2.7-2.9 gm trans-fat, <1500 mg sodium   Nutrition Intervention   Pt's individual nutrition plan including cholesterol goals reviewed with pt.   Benefits of adopting Therapeutic Lifestyle Changes discussed when Medficts reviewed.   Pt to attend the Portion Distortion class   Pt to attend the  ? Nutrition I class - met 09/27/12                    ? Nutrition II class   Pt given handouts for: ? Nutrition II class    Continue client-centered nutrition education by RD, as part of interdisciplinary care. Goal(s)   Pt to identify and limit food sources of saturated fat, trans fat, and cholesterol   Pt to identify food quantities necessary to achieve: ? wt loss to a goal wt of 210-222 lb (95.4-100.8 kg) at graduation from cardiac rehab. Monitor and Evaluate  progress toward nutrition goal with team. Nutrition Risk: Change to Moderate

## 2012-11-25 ENCOUNTER — Encounter (HOSPITAL_COMMUNITY)
Admission: RE | Admit: 2012-11-25 | Discharge: 2012-11-25 | Disposition: A | Discharge status: Home / Self Care | Payer: 59 | Source: Ambulatory Visit | Attending: Cardiovascular Disease | Admitting: Cardiovascular Disease

## 2012-11-28 ENCOUNTER — Encounter (HOSPITAL_COMMUNITY)
Admission: RE | Admit: 2012-11-28 | Discharge: 2012-11-28 | Disposition: A | Discharge status: Home / Self Care | Payer: 59 | Source: Ambulatory Visit | Attending: Cardiovascular Disease | Admitting: Cardiovascular Disease

## 2012-11-29 ENCOUNTER — Ambulatory Visit (INDEPENDENT_AMBULATORY_CARE_PROVIDER_SITE_OTHER): Payer: 59 | Admitting: *Deleted

## 2012-11-29 VITALS — BP 152/88 | HR 76 | Ht 73.0 in | Wt 240.0 lb

## 2012-11-29 DIAGNOSIS — I10 Essential (primary) hypertension: Secondary | ICD-10-CM

## 2012-11-29 MED ORDER — LOSARTAN POTASSIUM-HCTZ 50-12.5 MG PO TABS: 1.0000 | ORAL_TABLET | Freq: Every day | ORAL | Status: DC

## 2012-11-29 MED ORDER — CARVEDILOL 6.25 MG PO TABS: 6.2500 mg | ORAL_TABLET | Freq: Two times a day (BID) | ORAL | Status: DC

## 2012-11-29 NOTE — Progress Notes (Signed)
Pt here for bp check due to medication not being filled,  Large cuff used left arm after rest period, bp 152/88 p 76 Will forward to dr/nurse

## 2012-11-30 ENCOUNTER — Encounter (HOSPITAL_COMMUNITY)
Admission: RE | Admit: 2012-11-30 | Discharge: 2012-11-30 | Disposition: A | Discharge status: Home / Self Care | Payer: 59 | Source: Ambulatory Visit | Attending: Cardiovascular Disease | Admitting: Cardiovascular Disease

## 2012-11-30 ENCOUNTER — Telehealth: Payer: Self-pay | Admitting: *Deleted

## 2012-11-30 DIAGNOSIS — I10 Essential (primary) hypertension: Secondary | ICD-10-CM

## 2012-11-30 MED ORDER — LOSARTAN POTASSIUM-HCTZ 100-25 MG PO TABS: 1.0000 | ORAL_TABLET | Freq: Every day | ORAL | Status: DC

## 2012-11-30 NOTE — Telephone Encounter (Signed)
Message copied by Prescilla Sours on Wed Nov 30, 2012 10:56 AM ------      Message from: Verne Carrow D      Created: Wed Nov 30, 2012 10:12 AM                   ----- Message -----         From: Antony Odea, RN         Sent: 11/29/2012  11:19 AM           To: Kathleene Hazel, MD             ------

## 2012-11-30 NOTE — Telephone Encounter (Signed)
Message copied by Prescilla Sours on Wed Nov 30, 2012 11:14 AM ------      Message from: Verne Carrow D      Created: Wed Nov 30, 2012 10:12 AM                   ----- Message -----         From: Antony Odea, RN         Sent: 11/29/2012  11:19 AM           To: Kathleene Hazel, MD             ------

## 2012-11-30 NOTE — Telephone Encounter (Signed)
Alejandro Hazel, MD at 11/30/2012 10:12 AM   Status: Signed            Can we increase his Hyzaar to 100/25 mg once daily and schedule BMET for one week? Thanks, chris   LMTCB re: Change in Hyzaar Rx and BMET ordered. Awaiting patient call back to inform him and schedule BMET.

## 2012-11-30 NOTE — Progress Notes (Signed)
Can we increase his Hyzaar to 100/25 mg once daily and schedule BMET for one week? Thanks, chris

## 2012-12-02 ENCOUNTER — Encounter (HOSPITAL_COMMUNITY): Payer: 59

## 2012-12-05 ENCOUNTER — Encounter (HOSPITAL_COMMUNITY): Payer: 59

## 2012-12-06 ENCOUNTER — Encounter: Payer: Self-pay | Admitting: Gastroenterology

## 2012-12-06 ENCOUNTER — Ambulatory Visit (AMBULATORY_SURGERY_CENTER): Payer: 59 | Admitting: Gastroenterology

## 2012-12-06 VITALS — BP 159/91 | HR 63 | Temp 97.0°F | Resp 17 | Ht 72.0 in | Wt 238.0 lb

## 2012-12-06 DIAGNOSIS — D126 Benign neoplasm of colon, unspecified: Secondary | ICD-10-CM

## 2012-12-06 DIAGNOSIS — Z1211 Encounter for screening for malignant neoplasm of colon: Secondary | ICD-10-CM

## 2012-12-06 MED ORDER — SODIUM CHLORIDE 0.9 % IV SOLN: 500.0000 mL | INTRAVENOUS | Status: DC

## 2012-12-06 NOTE — Patient Instructions (Addendum)

## 2012-12-06 NOTE — Telephone Encounter (Signed)
Called patient and he is aware to come in for a bmet on 2/20.

## 2012-12-06 NOTE — Progress Notes (Signed)
Patient did not experience any of the following events: a burn prior to discharge; a fall within the facility; wrong site/side/patient/procedure/implant event; or a hospital transfer or hospital admission upon discharge from the facility. (G8907) Patient did not have preoperative order for IV antibiotic SSI prophylaxis. (G8918)  

## 2012-12-06 NOTE — Op Note (Signed)
[facility] [address] [place], [address]   COLONOSCOPY PROCEDURE REPORT  PATIENT: [name], Alejandro Reyes  MR#: [id number] BIRTHDATE: [birth year] , 51  yrs. old GENDER: Male ENDOSCOPIST: Louis Meckel, MD REFERRED BY: PROCEDURE DATE:  12/06/2012 PROCEDURE:   Colonoscopy with snare polypectomy ASA CLASS:   Class II INDICATIONS:average risk screening. MEDICATIONS: MAC sedation, administered by CRNA and propofol (Diprivan) 200mg  IV  DESCRIPTION OF PROCEDURE:   After the risks benefits and alternatives of the procedure were thoroughly explained, informed consent was obtained.  A digital rectal exam revealed no abnormalities of the rectum.   The LB [id number]  endoscope was introduced through the anus and advanced to the cecum, which was identified by both the appendix and ileocecal valve. No adverse events experienced.   The quality of the prep was Suprep excellent The instrument was then slowly withdrawn as the colon was fully examined.      COLON FINDINGS: A sessile 3mm polyp was found in the transverse colon.  A polypectomy was performed with a cold snare.  The resection was complete and the polyp tissue was completely retrieved.   A sessile polyp measuring 3 mm in size was found in the rectum.  A polypectomy was performed with a cold snare.  The resection was complete and the polyp tissue was completely retrieved.   The colon mucosa was otherwise normal.  Retroflexed views revealed no abnormalities. The time to cecum=2 minutes 12 seconds.  Withdrawal time=12 minutes 40 seconds.  The scope was withdrawn and the procedure completed. COMPLICATIONS: There were no complications.  ENDOSCOPIC IMPRESSION: 1.   Sessile polyp was found in the transverse colon; polypectomy was performed with a cold snare 2.   Sessile polyp measuring 3 mm in size was found in the rectum; polypectomy was performed with a cold snare 3.   The colon mucosa was otherwise  normal  RECOMMENDATIONS: If the polyp(s) removed today are proven to be adenomatous (pre-cancerous) polyps, you will need a repeat colonoscopy in 5 years.  Otherwise you should continue to follow colorectal cancer screening guidelines for "routine risk" patients with colonoscopy in 10 years.  You will receive a letter within 1-2 weeks with the results of your biopsy as well as final recommendations.  Please call my office if you have not received a letter after 3 weeks.   eSigned:  Louis Meckel, MD 12/06/2012 2:25 PM   cc:   PATIENT NAME:  [name], [name] MR#: [id number]

## 2012-12-06 NOTE — Progress Notes (Signed)
A/ox3 pleased with MAC report to April RN 

## 2012-12-06 NOTE — Progress Notes (Signed)
Called to room to assist during endoscopic procedure.  Patient ID and intended procedure confirmed with present staff. Received instructions for my participation in the procedure from the performing physician.  

## 2012-12-07 ENCOUNTER — Telehealth: Payer: Self-pay | Admitting: *Deleted

## 2012-12-07 ENCOUNTER — Encounter (HOSPITAL_COMMUNITY)
Admission: RE | Admit: 2012-12-07 | Discharge: 2012-12-07 | Disposition: A | Discharge status: Home / Self Care | Payer: 59 | Source: Ambulatory Visit | Attending: Cardiovascular Disease | Admitting: Cardiovascular Disease

## 2012-12-07 NOTE — Telephone Encounter (Signed)
  Follow up Call-  Call back number 12/06/2012  Post procedure Call Back phone  # [phone]  Permission to leave phone message Yes     No answer at # given.  Left message on voicemail.

## 2012-12-08 ENCOUNTER — Other Ambulatory Visit (INDEPENDENT_AMBULATORY_CARE_PROVIDER_SITE_OTHER): Payer: 59

## 2012-12-08 DIAGNOSIS — I10 Essential (primary) hypertension: Secondary | ICD-10-CM

## 2012-12-08 LAB — BASIC METABOLIC PANEL
CO2: 29 mEq/L (ref 19–32)
Chloride: 102 mEq/L (ref 96–112)
Sodium: 137 mEq/L (ref 135–145)

## 2012-12-09 ENCOUNTER — Telehealth: Payer: Self-pay | Admitting: *Deleted

## 2012-12-09 ENCOUNTER — Encounter (HOSPITAL_COMMUNITY)
Admission: RE | Admit: 2012-12-09 | Discharge: 2012-12-09 | Disposition: A | Discharge status: Home / Self Care | Payer: 59 | Source: Ambulatory Visit | Attending: Cardiovascular Disease | Admitting: Cardiovascular Disease

## 2012-12-09 NOTE — Progress Notes (Signed)
Pt weight 110.3kg today at cardiac rehab up from 109.3kg on 12/07/12 and up from 107.3kg on 11/30/12.  Pt asymptomatic, denies dyspnea, fatigue, edema.  Lungs clear, O2 sat-100%, no pedal edema, mild facial puffiness noted.  Pt reports he had colonscopy on 12/06/12 this week.  Pt states he has eaten cold cut sandwiches since that time.  Dr. Gibson Ramp office made aware.  No new orders received. Pt instructed to follow strict low na diet and monitor daily weights at home.  Pt advised to call on call MD for weight gain over weekend.  Will continue to monitor.

## 2012-12-09 NOTE — Telephone Encounter (Signed)
Per RN at cardiac Rehab  Pt had recent GI prep for a procedure and his wt is up 6 lbs according to their scales.  Pt has no SOB, no edema, lungs are clear.  He has no complaints.  BP is low today for his normal.  Pt instructed to continue present medications, weigh daily and restrict as much NA as possible.  If weight continues to go up he should call MD on call this weekend.

## 2012-12-12 ENCOUNTER — Encounter (HOSPITAL_COMMUNITY)
Admission: RE | Admit: 2012-12-12 | Discharge: 2012-12-12 | Disposition: A | Discharge status: Home / Self Care | Payer: 59 | Source: Ambulatory Visit | Attending: Cardiovascular Disease | Admitting: Cardiovascular Disease

## 2012-12-12 ENCOUNTER — Telehealth: Payer: Self-pay | Admitting: Cardiovascular Disease

## 2012-12-12 ENCOUNTER — Encounter: Payer: Self-pay | Admitting: Gastroenterology

## 2012-12-12 DIAGNOSIS — I5022 Chronic systolic (congestive) heart failure: Secondary | ICD-10-CM

## 2012-12-12 NOTE — Progress Notes (Signed)
Pt arrived to cardiac rehab with weight gain 1.4kg (111.7 lb) up from (110.3 lb) on Friday 12/09/12.  Pt admits to increased PO fluid intake this weekend in attempt to decrease sodium consumption.  Pt states he drank more water to decrease appetite.  Pt denies dyspnea, pedal edema.  Lungs clear.  Pt does exhibit facial puffiness/peri-orbital edema.  Highest BP with exercise 160/90, resting BP_120/60.  Dr. Sanjuana Kava made aware.  Pt advised per Dr. Sanjuana Kava someone will call from office with recommendations.  Pt verbalized understanding

## 2012-12-12 NOTE — Telephone Encounter (Signed)
I received a call this am from cardiac rehab about Alejandro Reyes. He has gained 6 lbs over the last few days. He has has an LvEF of 45%. He has recently been NPO for a colon procedure and reported large intake of water. I tried calling him today without an answer. Can we try him back later and start him on Lasix 40 mg po QDaily for 3 days, then repeat BMET on Thursday? I would go ahead and give him 30 tablets that he can continue to use as needed. We will follow up with recs for further Lasix after his BMET this week. Thanks, Thayer Ohm

## 2012-12-12 NOTE — Telephone Encounter (Signed)
LMTCB ./CY 

## 2012-12-14 ENCOUNTER — Other Ambulatory Visit: Payer: Self-pay | Admitting: *Deleted

## 2012-12-14 ENCOUNTER — Encounter (HOSPITAL_COMMUNITY)
Admission: RE | Admit: 2012-12-14 | Discharge: 2012-12-14 | Disposition: A | Discharge status: Home / Self Care | Payer: 59 | Source: Ambulatory Visit | Attending: Cardiovascular Disease | Admitting: Cardiovascular Disease

## 2012-12-14 DIAGNOSIS — I10 Essential (primary) hypertension: Secondary | ICD-10-CM

## 2012-12-16 ENCOUNTER — Encounter (HOSPITAL_COMMUNITY): Payer: 59

## 2012-12-16 NOTE — Telephone Encounter (Signed)
Left message to call back  

## 2012-12-16 NOTE — Telephone Encounter (Signed)
Unable to reach pt. Left message to call back.

## 2012-12-16 NOTE — Telephone Encounter (Signed)
Pt has not called back. I have reviewed with Dr. Clifton James and if weight still up or has shortness of breath he should take furosemide as instructed below. If weight normal and no shortness of breath or edema he does not need to take 3 day course of furosemide. Already has BMP scheduled for December 22, 2012. Pt will need this BMP and is aware of the lab appt.

## 2012-12-19 ENCOUNTER — Encounter (HOSPITAL_COMMUNITY)
Admission: RE | Admit: 2012-12-19 | Discharge: 2012-12-19 | Disposition: A | Discharge status: Home / Self Care | Payer: 59 | Source: Ambulatory Visit | Attending: Cardiovascular Disease | Admitting: Cardiovascular Disease

## 2012-12-19 DIAGNOSIS — I214 Non-ST elevation (NSTEMI) myocardial infarction: Secondary | ICD-10-CM | POA: Insufficient documentation

## 2012-12-19 DIAGNOSIS — I319 Disease of pericardium, unspecified: Secondary | ICD-10-CM | POA: Insufficient documentation

## 2012-12-19 DIAGNOSIS — I5022 Chronic systolic (congestive) heart failure: Secondary | ICD-10-CM | POA: Insufficient documentation

## 2012-12-19 DIAGNOSIS — Z5189 Encounter for other specified aftercare: Secondary | ICD-10-CM | POA: Insufficient documentation

## 2012-12-19 MED ORDER — POTASSIUM CHLORIDE CRYS ER 20 MEQ PO TBCR: 40.0000 meq | EXTENDED_RELEASE_TABLET | Freq: Every day | ORAL | Status: DC

## 2012-12-19 MED ORDER — FUROSEMIDE 40 MG PO TABS: 40.0000 mg | ORAL_TABLET | Freq: Every day | ORAL | Status: DC

## 2012-12-19 NOTE — Addendum Note (Signed)
Addended by: Dossie Arbour on: 12/19/2012 09:07 AM   Modules accepted: Orders

## 2012-12-19 NOTE — Progress Notes (Signed)
Pt arrived to cardiac rehab with weight gain up 3kg over weekend.  Pt denies dyspnea or edema.  Lungs clear to auscultation.  Pt states he did not receive instruction to start lasix per Dr. Karie Schwalbe office last week.  pc to Dr. Karie Schwalbe nurse Dennie Bible, who states office was unable to reach pt via phone, pt did not respond to messages left.  Pat reviewed continued weight gain with Dr. Clifton James.  Order given to start Lasix 40mg  once daily with Kdur once daily.  Pt is to return to lab for BMP on Friday 12/23/12 early am.  rx will be sent to [facility] pharmacy for pt to pick up today.  Written and verbal instructions given to pt.  Pt verbalized understanding.

## 2012-12-19 NOTE — Telephone Encounter (Signed)
Received call from Cardiac Rehab. Pt is there for rehab and wt is up 6 more pounds for total of 12 pounds. No shortness of breath and reports he is feeling OK. Lungs clear. No edema.  Reviewed with Dr. Clifton James who would like pt to start furosemide 40 mg daily and k-dur 40 meq daily and come into office for BMP early Friday.  I gave these instructions to Aurea Graff at Cardiac Rehab and she will give them to pt.  Will send prescriptions to [facility] out patient pharmacy.

## 2012-12-21 ENCOUNTER — Encounter (HOSPITAL_COMMUNITY)
Admission: RE | Admit: 2012-12-21 | Discharge: 2012-12-21 | Disposition: A | Discharge status: Home / Self Care | Payer: 59 | Source: Ambulatory Visit | Attending: Cardiovascular Disease | Admitting: Cardiovascular Disease

## 2012-12-22 ENCOUNTER — Other Ambulatory Visit: Payer: 59

## 2012-12-22 ENCOUNTER — Telehealth: Payer: Self-pay | Admitting: *Deleted

## 2012-12-22 NOTE — Telephone Encounter (Signed)
Pharmacy calling to see if office would fill patient's Sucralfate, Carafate 1 gram or if patient has been discontinued from this medication. Pharmacist states this medication was filled by PA at the hospital 08-28-12. According to notes patient did see Dr. Clifton James 09-13-12. Explained to pharmacist that I would verify with Dr. Gibson Ramp nurse and him to see if they fill this medication.

## 2012-12-22 NOTE — Telephone Encounter (Signed)
We do not prescribe his sucralfate. cdm

## 2012-12-23 ENCOUNTER — Encounter (HOSPITAL_COMMUNITY): Payer: 59

## 2012-12-23 ENCOUNTER — Other Ambulatory Visit: Payer: 59

## 2012-12-26 ENCOUNTER — Other Ambulatory Visit (INDEPENDENT_AMBULATORY_CARE_PROVIDER_SITE_OTHER): Payer: 59

## 2012-12-26 ENCOUNTER — Encounter (HOSPITAL_COMMUNITY)
Admission: RE | Admit: 2012-12-26 | Discharge: 2012-12-26 | Disposition: A | Discharge status: Home / Self Care | Payer: 59 | Source: Ambulatory Visit | Attending: Cardiovascular Disease | Admitting: Cardiovascular Disease

## 2012-12-26 DIAGNOSIS — I10 Essential (primary) hypertension: Secondary | ICD-10-CM

## 2012-12-26 LAB — BASIC METABOLIC PANEL
BUN: 16 mg/dL (ref 6–23)
CO2: 27 mEq/L (ref 19–32)
Calcium: 8.6 mg/dL (ref 8.4–10.5)
GFR: 54.36 mL/min — ABNORMAL LOW (ref >60.00)
Glucose, Bld: 101 mg/dL — ABNORMAL HIGH (ref 70–99)

## 2012-12-26 NOTE — Telephone Encounter (Signed)
Pharmacist at [facility] out pt pharmacy notified.  They will send to pt's GI doctor

## 2012-12-28 ENCOUNTER — Other Ambulatory Visit: Payer: Self-pay | Admitting: *Deleted

## 2012-12-28 ENCOUNTER — Encounter (HOSPITAL_COMMUNITY)
Admission: RE | Admit: 2012-12-28 | Discharge: 2012-12-28 | Disposition: A | Discharge status: Home / Self Care | Payer: 59 | Source: Ambulatory Visit | Attending: Cardiovascular Disease | Admitting: Cardiovascular Disease

## 2012-12-28 MED ORDER — ATORVASTATIN CALCIUM 40 MG PO TABS: 40.0000 mg | ORAL_TABLET | Freq: Every day | ORAL | Status: DC

## 2012-12-28 MED ORDER — PANTOPRAZOLE SODIUM 40 MG PO TBEC: 40.0000 mg | DELAYED_RELEASE_TABLET | Freq: Two times a day (BID) | ORAL | Status: AC

## 2012-12-29 ENCOUNTER — Other Ambulatory Visit: Payer: Self-pay | Admitting: *Deleted

## 2012-12-30 ENCOUNTER — Ambulatory Visit (INDEPENDENT_AMBULATORY_CARE_PROVIDER_SITE_OTHER): Payer: 59 | Admitting: Nurse Practitioner

## 2012-12-30 ENCOUNTER — Encounter (HOSPITAL_COMMUNITY): Admission: RE | Admit: 2012-12-30 | Payer: 59 | Source: Ambulatory Visit

## 2012-12-30 ENCOUNTER — Encounter: Payer: Self-pay | Admitting: Nurse Practitioner

## 2012-12-30 VITALS — BP 130/80 | HR 68 | Ht 73.0 in | Wt 249.1 lb

## 2012-12-30 DIAGNOSIS — I5022 Chronic systolic (congestive) heart failure: Secondary | ICD-10-CM

## 2012-12-30 DIAGNOSIS — E0590 Thyrotoxicosis, unspecified without thyrotoxic crisis or storm: Secondary | ICD-10-CM

## 2012-12-30 LAB — CBC WITH DIFFERENTIAL/PLATELET
Basophils Absolute: 0.1 10*3/uL (ref 0.0–0.1)
Basophils Relative: 0.9 % (ref 0.0–3.0)
Eosinophils Absolute: 0.2 10*3/uL (ref 0.0–0.7)
Eosinophils Relative: 2 % (ref 0.0–5.0)
HCT: 40.3 % (ref 39.0–52.0)
Hemoglobin: 13.6 g/dL (ref 13.0–17.0)
Lymphocytes Relative: 56 % — ABNORMAL HIGH (ref 12.0–46.0)
Lymphs Abs: 4.4 10*3/uL — ABNORMAL HIGH (ref 0.7–4.0)
MCHC: 33.9 g/dL (ref 30.0–36.0)
MCV: 87.4 fl (ref 78.0–100.0)
Monocytes Absolute: 0.5 10*3/uL (ref 0.1–1.0)
Monocytes Relative: 6.5 % (ref 3.0–12.0)
Neutro Abs: 2.7 10*3/uL (ref 1.4–7.7)
Neutrophils Relative %: 34.6 % — ABNORMAL LOW (ref 43.0–77.0)
Platelets: 141 10*3/uL — ABNORMAL LOW (ref 150.0–400.0)
RBC: 4.61 Mil/uL (ref 4.22–5.81)
RDW: 14.7 % — ABNORMAL HIGH (ref 11.5–14.6)
WBC: 7.9 10*3/uL (ref 4.5–10.5)

## 2012-12-30 LAB — BASIC METABOLIC PANEL
BUN: 17 mg/dL (ref 6–23)
CO2: 26 mEq/L (ref 19–32)
Calcium: 8.9 mg/dL (ref 8.4–10.5)
Chloride: 99 mEq/L (ref 96–112)
Creatinine, Ser: 1.8 mg/dL — ABNORMAL HIGH (ref 0.4–1.5)
GFR: 52.57 mL/min — ABNORMAL LOW (ref >60.00)
Glucose, Bld: 93 mg/dL (ref 70–99)
Potassium: 3.9 mEq/L (ref 3.5–5.1)
Sodium: 136 mEq/L (ref 135–145)

## 2012-12-30 LAB — T3, FREE: T3, Free: 1.6 pg/mL — ABNORMAL LOW (ref 2.3–4.2)

## 2012-12-30 LAB — TSH: TSH: 182.33 u[IU]/mL — ABNORMAL HIGH (ref 0.35–5.50)

## 2012-12-30 LAB — T4, FREE: Free T4: 0.02 ng/dL — ABNORMAL LOW (ref 0.60–1.60)

## 2012-12-30 NOTE — Progress Notes (Signed)
Alejandro Reyes Date of Birth: 21-Jun-1961 Medical Record #[id number]  History of Present Illness: Alejandro Reyes is seen back today for a work in visit. He is seen for Dr. Clifton Reyes. He has mild nonobstructive CAD, HTN, GERD, COPD, tobacco abuse and past possible myopericarditis or stress induced CM back in November. Cath at that time showed mild disease in the left main, LAD and RCA but no flow limiting lesions. EF was 45%. EF 65% prior to his discharge at that time. Was also started on Tapazole at that time for hyperthyroidism.   Last seen here at the end of November. Was doing ok and his cardiac status seemed stable.   He comes in today. He is here alone. 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 Very busy night last night and he is tired. He says he does not know why he is here. He denies any chest pain. Not short of breath. No swelling. Actively exercises - walks and runs. He is a little fatigued. He notes fluctuations in his weight and does not understand why. He NEVER had his endocrine follow up and has kept taking his Tapazole. Has not seen his primary care "in quite some time" as well. Creatine was up a little and his lasix was stopped. He restricts his salt.   Current Outpatient Prescriptions on File Prior to Visit  Medication Sig Dispense Refill  . aspirin EC 81 MG EC tablet Take 1 tablet (81 mg total) by mouth daily.      Marland Kitchen atorvastatin (LIPITOR) 40 MG tablet Take 1 tablet (40 mg total) by mouth daily at 6 PM.  30 tablet  6  . carvedilol (COREG) 6.25 MG tablet Take 1 tablet (6.25 mg total) by mouth 2 (two) times daily with a meal.  60 tablet  5  . losartan-hydrochlorothiazide (HYZAAR) 100-25 MG per tablet Take 1 tablet by mouth daily.  90 tablet  1  . methimazole (TAPAZOLE) 10 MG tablet Take 6 tablets (60 mg total) by mouth daily.  180 tablet  3  . nitroGLYCERIN (NITROSTAT) 0.4 MG SL tablet Place 1 tablet (0.4 mg total) under the tongue every 5 (five) minutes x 3 doses as needed for chest pain.  25  tablet  3  . pantoprazole (PROTONIX) 40 MG tablet Take 1 tablet (40 mg total) by mouth 2 (two) times daily before a meal.  60 tablet  6  . potassium chloride SA (K-DUR,KLOR-CON) 20 MEQ tablet Take 2 tablets (40 mEq total) by mouth daily.  60 tablet  6  . sucralfate (CARAFATE) 1 G tablet Take 1 tablet (1 g total) by mouth 4 (four) times daily -  with meals and at bedtime.  120 tablet  1   No current facility-administered medications on file prior to visit.    No Known Allergies  Past Medical History  Diagnosis Date  . Hypertension   . Chronic systolic CHF (congestive heart failure), NYHA class 1   . Tobacco abuse   . COPD (chronic obstructive pulmonary disease)   . GERD (gastroesophageal reflux disease)   . Alcohol abuse, in remission   . Hyperthyroidism   . CAD (coronary artery disease)   . Pericarditis   . Pneumonia     Past Surgical History  Procedure Laterality Date  . Tonsillectomy    . Esophagogastroduodenoscopy  08/26/2012    Procedure: ESOPHAGOGASTRODUODENOSCOPY (EGD);  Surgeon: Alejandro Meckel, MD;  Location: [facility] ENDOSCOPY;  Service: Endoscopy;  Laterality: N/A;  . Appendectomy  1976    History  Smoking status  . Former Smoker  . Types: Cigarettes  Smokeless tobacco  . Never Used    Comment: pt has stopped smoking for a few days    History  Alcohol Use No    Family History  Problem Relation Age of Onset  . Hypertension Father   . Cancer - Other Father     SCCa of Oropharynx  . Kidney disease Father   . Kidney disease Brother   . Heart disease Maternal Aunt   . Heart disease Paternal Aunt   . Heart disease Maternal Grandfather     Review of Systems: The review of systems is per the HPI.  All other systems were reviewed and are negative.  Physical Exam: BP 130/80  Pulse 68  Ht 6\' 1"  (1.854 m)  Wt 249 lb 1.9 oz (113 kg)  BMI 32.87 kg/m2 Patient is very pleasant and in no acute distress. He is a little tired looking this morning. Skin is warm and  dry. Color is normal.  HEENT is unremarkable. Normocephalic/atraumatic. PERRL. Sclera are nonicteric. Neck is supple. No masses. No JVD. Lungs are clear. Cardiac exam shows a regular rate and rhythm. Abdomen is soft. Extremities are without edema. Gait and ROM are intact. No gross neurologic deficits noted.  LABORATORY DATA: Pending  Lab Results  Component Value Date   WBC 7.1 08/28/2012   HGB 12.4* 08/28/2012   HCT 37.1* 08/28/2012   PLT 154 08/28/2012   GLUCOSE 101* 12/26/2012   CHOL 152 08/25/2012   TRIG 79 08/25/2012   HDL 29* 08/25/2012   LDLCALC 107* 08/25/2012   ALT 65* 08/26/2012   AST 25 08/26/2012   NA 137 12/26/2012   K 3.7 12/26/2012   CL 102 12/26/2012   CREATININE 1.7* 12/26/2012   BUN 16 12/26/2012   CO2 27 12/26/2012   TSH <0.008* 08/24/2012   INR 1.03 08/24/2012   HGBA1C 5.9* 08/24/2012   Echo Study Conclusions from November 2013  - Left ventricle: The cavity size was normal. Wall thickness was increased in a pattern of mild LVH. Systolic function was normal. The estimated ejection fraction was in the range of 60% to 65%. Wall motion was normal; there were no regional wall motion abnormalities. Left ventricular diastolic function parameters were normal. - Aortic valve: There was no stenosis. There was a mildly elevated gradient across the aortic valve but the valve opened normally. Suspect high flow state due to hyperthyroidism. Mean gradient: 12mm Hg (S). Valve area: 2.65cm^2(VTI). Valve area: 2.47cm^2 (Vmax). - Mitral valve: No significant regurgitation. - Right ventricle: The cavity size was normal. Systolic function was normal. - Pulmonary arteries: No complete TR doppler jet so unable to estimate PA systolic pressure. - Inferior vena cava: The vessel was normal in size; the respirophasic diameter changes were in the normal range (= 50%); findings are consistent with normal central venous pressure.   Angiographic Findings:   Left main: 20% distal stenosis.    Left Anterior Descending Artery: Large caliber vessel that courses to the apex. The ostium appears to have 20-30% stenosis. The mid vessel has diffuse 10-20% stenosis. There are two small to moderate caliber diagonal branches without focal stenosis.  Circumflex Artery: Large caliber vessel that terminates into a moderate sized obtuse marginal branch. There is mild plaque in the proximal vessel and in the marginal branch.  Right Coronary Artery: Moderate caliber, dominant vessel with 40% mid stenosis just after a small RV marginal branch. Multiple views were taken and this does not  appear to be flow limiting.  Left Ventricular Angiogram: LVEF 45% with severe hypokinesis of the antero-apex and infero-apex.   Impression:  1. Mild to moderate non-obstructive CAD  2. Segmental LV systolic dysfunction  3. 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   Recommendations: Will admit to CCU. Will treat with IV PPI. Will follow cardiac markers. Will continue ASA, beta blocker, statin.    Assessment / Plan:  1. Hyperthyroidism - on Tapazole - has not had any follow up since initial diagnosis. Will recheck his labs and more than likely get him to endocrine/primary care.  2. CAD - no chest pain reported.   3. Prior cardiomyopathy - EF was back to normal by cath. He is not having any CHF symptoms.  4. HTN - repeat BP's by me with the large cuff are normal. I have left him on his current regimen for now.   Further disposition to follow.   Patient is agreeable to this plan and will call if any problems develop in the interim.   Rosalio Macadamia, RN, ANP-C [facility] [address] [place], [place]  [address]

## 2012-12-30 NOTE — Patient Instructions (Addendum)
Stay on your current medicines  We will check labs today  Congratulations for stopping smoking   Call the [facility] office at [phone] if you have any questions, problems or concerns.

## 2013-01-02 ENCOUNTER — Encounter (HOSPITAL_COMMUNITY): Admission: RE | Admit: 2013-01-02 | Payer: 59 | Source: Ambulatory Visit

## 2013-01-02 ENCOUNTER — Encounter: Payer: Self-pay | Admitting: Internal Medicine

## 2013-01-02 ENCOUNTER — Ambulatory Visit (INDEPENDENT_AMBULATORY_CARE_PROVIDER_SITE_OTHER): Payer: 59 | Admitting: Internal Medicine

## 2013-01-02 ENCOUNTER — Telehealth: Payer: Self-pay | Admitting: Internal Medicine

## 2013-01-02 VITALS — BP 140/80 | HR 77 | Temp 97.7°F | Resp 16 | Ht 72.5 in | Wt 249.0 lb

## 2013-01-02 DIAGNOSIS — R7989 Other specified abnormal findings of blood chemistry: Secondary | ICD-10-CM | POA: Insufficient documentation

## 2013-01-02 DIAGNOSIS — E0590 Thyrotoxicosis, unspecified without thyrotoxic crisis or storm: Secondary | ICD-10-CM

## 2013-01-02 DIAGNOSIS — E039 Hypothyroidism, unspecified: Secondary | ICD-10-CM

## 2013-01-02 MED ORDER — SUCRALFATE 1 G PO TABS: 1.0000 g | ORAL_TABLET | Freq: Three times a day (TID) | ORAL | Status: DC

## 2013-01-02 NOTE — Progress Notes (Signed)
Subjective:    Patient ID: Alejandro Reyes, male    DOB: May 28, 1961, 52 y.o.   MRN: [id number]  Thyroid Problem Presents for follow-up visit. Symptoms include weight gain. Patient reports no anxiety, cold intolerance, constipation, depressed mood, diaphoresis, diarrhea, dry skin, fatigue, hair loss, heat intolerance, hoarse voice, leg swelling, menstrual problem, nail problem, palpitations, tremors, visual change or weight loss. The symptoms have been stable.      Review of Systems  Constitutional: Positive for weight gain and unexpected weight change. Negative for fever, chills, weight loss, diaphoresis, activity change, appetite change and fatigue.  HENT: Negative.  Negative for hoarse voice.   Eyes: Negative.   Respiratory: Negative.  Negative for apnea, cough, chest tightness, wheezing and stridor.   Cardiovascular: Negative for chest pain, palpitations and leg swelling.  Gastrointestinal: Negative for nausea, vomiting, abdominal pain, diarrhea, constipation and anal bleeding.  Endocrine: Negative.  Negative for cold intolerance and heat intolerance.  Genitourinary: Negative.  Negative for menstrual problem.  Musculoskeletal: Negative for myalgias, back pain, joint swelling, arthralgias and gait problem.  Skin: Negative for color change, pallor, rash and wound.  Allergic/Immunologic: Negative.   Neurological: Negative.  Negative for tremors.  Hematological: Negative for adenopathy. Does not bruise/bleed easily.  Psychiatric/Behavioral: Negative.        Objective:   Physical Exam  Vitals reviewed. Constitutional: He is oriented to person, place, and time. He appears well-developed and well-nourished. No distress.  HENT:  Head: Normocephalic and atraumatic.  Mouth/Throat: Oropharynx is clear and moist. No oropharyngeal exudate.  Eyes: Conjunctivae are normal. Right eye exhibits no discharge. Left eye exhibits no discharge. No scleral icterus.  Neck: Normal range of motion. Neck  supple. No JVD present. No tracheal deviation present. No thyromegaly present.  Cardiovascular: Normal rate, regular rhythm, normal heart sounds and intact distal pulses.  Exam reveals no gallop and no friction rub.   No murmur heard. Pulmonary/Chest: Effort normal and breath sounds normal. No stridor. No respiratory distress. He has no wheezes. He has no rales. He exhibits no tenderness.  Abdominal: Soft. Bowel sounds are normal. He exhibits no distension and no mass. There is no tenderness. There is no rebound and no guarding.  Musculoskeletal: Normal range of motion. He exhibits no edema and no tenderness.  Lymphadenopathy:    He has no cervical adenopathy.  Neurological: He is oriented to person, place, and time.  Skin: Skin is warm and dry. No rash noted. He is not diaphoretic. No erythema. No pallor.  Psychiatric: He has a normal mood and affect. His behavior is normal. Judgment and thought content normal.      Results for TORBEN, SOLOWAY (MRN [id number]) as of 01/03/2013 08:00  Ref. Range 12/30/2012 10:37  Sodium Latest Range: 135-145 mEq/L 136  Potassium Latest Range: 3.5-5.1 mEq/L 3.9  Chloride Latest Range: 96-112 mEq/L 99  CO2 Latest Range: 19-32 mEq/L 26  BUN Latest Range: 6-23 mg/dL 17  Creatinine Latest Range: 0.4-1.5 mg/dL 1.8 (H)  Calcium Latest Range: 8.4-10.5 mg/dL 8.9  Glucose Latest Range: 70-99 mg/dL 93  GFR Latest Range: >78.29 mL/min 52.57 (L)  WBC Latest Range: 4.5-10.5 K/uL 7.9  RBC Latest Range: 4.22-5.81 Mil/uL 4.61  Hemoglobin Latest Range: 13.0-17.0 g/dL 56.2  HCT Latest Range: 39.0-52.0 % 40.3  MCV Latest Range: 78.0-100.0 fl 87.4  MCHC Latest Range: 30.0-36.0 g/dL 13.0  RDW Latest Range: 11.5-14.6 % 14.7 (H)  Platelets Latest Range: 150.0-400.0 K/uL 141.0 (L)  Neutrophils Relative Latest Range: 43.0-77.0 % 34.6 (L)  Lymphocytes Relative  Latest Range: 12.0-46.0 % 56.0 (H)  Monocytes Relative Latest Range: 3.0-12.0 % 6.5  Eosinophils Relative Latest  Range: 0.0-5.0 % 2.0  Basophils Relative Latest Range: 0.0-3.0 % 0.9  NEUT# Latest Range: 1.4-7.7 K/uL 2.7  Lymphocytes Absolute Latest Range: 0.7-4.0 K/uL 4.4 (H)  Monocytes Absolute Latest Range: 0.1-1.0 K/uL 0.5  Eosinophils Absolute Latest Range: 0.0-0.7 K/uL 0.2  Basophils Absolute Latest Range: 0.0-0.1 K/uL 0.1  TSH Latest Range: 0.35-5.50 uIU/mL 182.33 (H)  Free T4 Latest Range: 0.60-1.60 ng/dL 1.61 (L)  T3, Free Latest Range: 2.3-4.2 pg/mL 1.6 (L)      Assessment & Plan:

## 2013-01-02 NOTE — Patient Instructions (Signed)
Hyperthyroidism The thyroid is a large gland located in the lower front part of your neck. The thyroid helps control metabolism. Metabolism is how your body uses food. It controls metabolism with the hormone thyroxine. When the thyroid is overactive, it produces too much hormone. When this happens, these following problems may occur:   Nervousness  Heat intolerance  Weight loss (in spite of increase food intake)  Diarrhea  Change in hair or skin texture  Palpitations (heart skipping or having extra beats)  Tachycardia (rapid heart rate)  Loss of menstruation (amenorrhea)  Shaking of the hands CAUSES  Grave's Disease (the immune system attacks the thyroid gland). This is the most common cause.  Inflammation of the thyroid gland.  Tumor (usually benign) in the thyroid gland or elsewhere.  Excessive use of thyroid medications (both prescription and 'natural').  Excessive ingestion of Iodine. DIAGNOSIS  To prove hyperthyroidism, your caregiver may do blood tests and ultrasound tests. Sometimes the signs are hidden. It may be necessary for your caregiver to watch this illness with blood tests, either before or after diagnosis and treatment. TREATMENT Short-term treatment There are several treatments to control symptoms. Drugs called beta blockers may give some relief. Drugs that decrease hormone production will provide temporary relief in many people. These measures will usually not give permanent relief. Definitive therapy There are treatments available which can be discussed between you and your caregiver which will permanently treat the problem. These treatments range from surgery (removal of the thyroid), to the use of radioactive iodine (destroys the thyroid by radiation), to the use of antithyroid drugs (interfere with hormone synthesis). The first two treatments are permanent and usually successful. They most often require hormone replacement therapy for life. This is because  it is impossible to remove or destroy the exact amount of thyroid required to make a person euthyroid (normal). HOME CARE INSTRUCTIONS  See your caregiver if the problems you are being treated for get worse. Examples of this would be the problems listed above. SEEK MEDICAL CARE IF: Your general condition worsens. MAKE SURE YOU:   Understand these instructions.  Will watch your condition.  Will get help right away if you are not doing well or get worse. Document Released: 10/05/2005 Document Revised: 12/28/2011 Document Reviewed: 02/16/2007 [facility] Patient Information 2013 [place], [place].

## 2013-01-02 NOTE — Telephone Encounter (Signed)
Dr.Gerhardt's office called hoping to get the patient in for a new pt appointment.  They were insistent that the patient be seen today.  Dr.Gerhardt stated the reason was "high TSH levels".  I went ahead and put him in at 3:45pm, but if this does not work, please let me know and I will notify the office.  I informed Dr.Gerhardt's office that I would have to confirm with you regarding this appointment time.  Please let me know if this okay, and if not i'll be happy to reschedule the patient to the next available new pt time slot.   Thanks!

## 2013-01-03 ENCOUNTER — Encounter: Payer: Self-pay | Admitting: Internal Medicine

## 2013-01-03 ENCOUNTER — Other Ambulatory Visit: Payer: Self-pay | Admitting: *Deleted

## 2013-01-03 NOTE — Assessment & Plan Note (Signed)
Hyper T can be tricky so I have asked him to see ENDO.

## 2013-01-03 NOTE — Assessment & Plan Note (Addendum)
This is a complicated scenario in a CAD pt who was diagnosed with Hyper T 4 months ago and took tapazole until 2 weeks ago when he ran out of it. Now TSH is very high. Clinically he appears euthyroid. I do not think he needs any meds today. I have asked him to stay off the tapazol and will follow his TSH closely with a repeat in 1 week.

## 2013-01-03 NOTE — Addendum Note (Signed)
Addended by: Etta Grandchild on: 01/03/2013 08:33 AM   Modules accepted: Orders

## 2013-01-04 ENCOUNTER — Encounter (HOSPITAL_COMMUNITY)
Admission: RE | Admit: 2013-01-04 | Discharge: 2013-01-04 | Disposition: A | Discharge status: Home / Self Care | Payer: 59 | Source: Ambulatory Visit | Attending: Cardiovascular Disease | Admitting: Cardiovascular Disease

## 2013-01-06 ENCOUNTER — Encounter (HOSPITAL_COMMUNITY)
Admission: RE | Admit: 2013-01-06 | Discharge: 2013-01-06 | Disposition: A | Discharge status: Home / Self Care | Payer: 59 | Source: Ambulatory Visit | Attending: Cardiovascular Disease | Admitting: Cardiovascular Disease

## 2013-01-09 ENCOUNTER — Encounter (HOSPITAL_COMMUNITY)
Admission: RE | Admit: 2013-01-09 | Discharge: 2013-01-09 | Disposition: A | Discharge status: Home / Self Care | Payer: 59 | Source: Ambulatory Visit | Attending: Cardiovascular Disease | Admitting: Cardiovascular Disease

## 2013-01-10 ENCOUNTER — Ambulatory Visit: Payer: 59 | Admitting: Endocrinology

## 2013-01-10 DIAGNOSIS — Z0289 Encounter for other administrative examinations: Secondary | ICD-10-CM

## 2013-01-11 ENCOUNTER — Encounter (HOSPITAL_COMMUNITY)
Admission: RE | Admit: 2013-01-11 | Discharge: 2013-01-11 | Disposition: A | Discharge status: Home / Self Care | Payer: 59 | Source: Ambulatory Visit | Attending: Cardiovascular Disease | Admitting: Cardiovascular Disease

## 2013-01-11 ENCOUNTER — Encounter (HOSPITAL_COMMUNITY): Payer: Self-pay

## 2013-01-11 NOTE — Progress Notes (Signed)
Pt graduated from cardiac rehab today. Pt has made significant lifestyle changes including understanding medication regimen and importance of compliance, Significant sodium reduction,  Development of good exercise habits and positive social interaction.  Pt has had significant reduction in hypertension.  Discussed importance of routine f/u with MD, importance of keeping appt as scheduled and when to call MD.  Understanding verbalized.  Pt plans to exercise on his own at his workplace fitness center.   Pt should be commended for his success

## 2013-01-13 ENCOUNTER — Encounter (HOSPITAL_COMMUNITY): Payer: 59

## 2013-01-16 ENCOUNTER — Encounter (HOSPITAL_COMMUNITY): Payer: 59

## 2013-01-18 ENCOUNTER — Encounter (HOSPITAL_COMMUNITY): Payer: 59

## 2013-01-20 ENCOUNTER — Encounter (HOSPITAL_COMMUNITY): Payer: 59

## 2013-01-23 ENCOUNTER — Encounter (HOSPITAL_COMMUNITY): Payer: 59

## 2013-01-25 ENCOUNTER — Encounter (HOSPITAL_COMMUNITY): Payer: 59

## 2013-01-27 ENCOUNTER — Encounter (HOSPITAL_COMMUNITY): Payer: 59

## 2013-04-18 ENCOUNTER — Ambulatory Visit (INDEPENDENT_AMBULATORY_CARE_PROVIDER_SITE_OTHER): Payer: 59 | Admitting: Internal Medicine

## 2013-04-18 ENCOUNTER — Encounter: Payer: Self-pay | Admitting: Internal Medicine

## 2013-04-18 VITALS — BP 110/70 | HR 81 | Temp 98.0°F | Resp 16 | Wt 232.0 lb

## 2013-04-18 DIAGNOSIS — N529 Male erectile dysfunction, unspecified: Secondary | ICD-10-CM | POA: Insufficient documentation

## 2013-04-18 DIAGNOSIS — I10 Essential (primary) hypertension: Secondary | ICD-10-CM

## 2013-04-18 DIAGNOSIS — E0590 Thyrotoxicosis, unspecified without thyrotoxic crisis or storm: Secondary | ICD-10-CM

## 2013-04-18 DIAGNOSIS — Z0000 Encounter for general adult medical examination without abnormal findings: Secondary | ICD-10-CM

## 2013-04-18 DIAGNOSIS — R7989 Other specified abnormal findings of blood chemistry: Secondary | ICD-10-CM

## 2013-04-18 DIAGNOSIS — R946 Abnormal results of thyroid function studies: Secondary | ICD-10-CM

## 2013-04-18 MED ORDER — TADALAFIL 5 MG PO TABS: 5.0000 mg | ORAL_TABLET | Freq: Every day | ORAL | Status: DC | PRN

## 2013-04-18 NOTE — Progress Notes (Signed)
Subjective:    Patient ID: Alejandro Reyes, male    DOB: 10/29/1960, 52 y.o.   MRN: [id number]  Erectile Dysfunction This is a new problem. The current episode started more than 1 month ago. The problem has been gradually worsening since onset. The nature of his difficulty is achieving erection, maintaining erection and penetration. Non-physiologic factors contributing to erectile dysfunction are anxiety. He reports no decreased libido or performance anxiety. He reports his erection duration to be 1 to 5 minutes. Irritative symptoms do not include frequency, nocturia or urgency. Obstructive symptoms do not include dribbling, incomplete emptying, an intermittent stream, a slower stream, straining or a weak stream. Pertinent negatives include no chills, dysuria, genital pain, hematuria, hesitancy or inability to urinate. The symptoms are aggravated by medications. Past treatments include nothing. Risk factors include hypertension.      Review of Systems  Constitutional: Negative.  Negative for fever, chills, diaphoresis, activity change, appetite change, fatigue and unexpected weight change.  HENT: Negative.   Eyes: Negative.   Respiratory: Negative.  Negative for apnea, cough, choking, chest tightness, shortness of breath, wheezing and stridor.   Cardiovascular: Negative.  Negative for chest pain, palpitations and leg swelling.  Gastrointestinal: Negative.  Negative for nausea, vomiting, abdominal pain, diarrhea and constipation.  Endocrine: Negative.   Genitourinary: Negative.  Negative for dysuria, hesitancy, urgency, frequency, hematuria, decreased libido, incomplete emptying and nocturia.  Musculoskeletal: Negative.  Negative for myalgias, back pain, joint swelling, arthralgias and gait problem.  Skin: Negative.   Allergic/Immunologic: Negative.   Neurological: Negative.  Negative for dizziness, syncope, speech difficulty, weakness, light-headedness, numbness and headaches.  Hematological:  Negative.  Negative for adenopathy. Does not bruise/bleed easily.  Psychiatric/Behavioral: Negative.        Objective:   Physical Exam  Vitals reviewed. Constitutional: He is oriented to person, place, and time. He appears well-developed and well-nourished. No distress.  HENT:  Head: Normocephalic and atraumatic.  Mouth/Throat: Oropharynx is clear and moist. No oropharyngeal exudate.  Eyes: Conjunctivae are normal. Right eye exhibits no discharge. Left eye exhibits no discharge. No scleral icterus.  Neck: Normal range of motion. Neck supple. No JVD present. No tracheal deviation present. No thyromegaly present.  Cardiovascular: Normal rate, regular rhythm, normal heart sounds and intact distal pulses.  Exam reveals no gallop and no friction rub.   No murmur heard. Pulmonary/Chest: Effort normal and breath sounds normal. No stridor. No respiratory distress. He has no wheezes. He has no rales. He exhibits no tenderness.  Abdominal: Soft. Bowel sounds are normal. He exhibits no distension and no mass. There is no tenderness. There is no rebound and no guarding. Hernia confirmed negative in the right inguinal area and confirmed negative in the left inguinal area.  Genitourinary: Rectum normal, testes normal and penis normal. Rectal exam shows no external hemorrhoid, no internal hemorrhoid, no fissure, no mass, no tenderness and anal tone normal. Guaiac negative stool. Prostate is enlarged (1+ smooth symm BPH). Prostate is not tender. Right testis shows no mass, no swelling and no tenderness. Right testis is descended. Left testis shows no swelling and no tenderness. Left testis is descended. Circumcised. No phimosis, paraphimosis, hypospadias, penile erythema or penile tenderness. No discharge found.  Musculoskeletal: Normal range of motion. He exhibits no edema and no tenderness.  Lymphadenopathy:    He has no cervical adenopathy.       Right: No inguinal adenopathy present.       Left: No  inguinal adenopathy present.  Neurological: He is oriented to  person, place, and time.  Skin: Skin is warm and dry. No rash noted. He is not diaphoretic. No erythema. No pallor.  Psychiatric: He has a normal mood and affect. His behavior is normal. Judgment and thought content normal.     Lab Results  Component Value Date   WBC 7.9 12/30/2012   HGB 13.6 12/30/2012   HCT 40.3 12/30/2012   PLT 141.0* 12/30/2012   GLUCOSE 93 12/30/2012   CHOL 152 08/25/2012   TRIG 79 08/25/2012   HDL 29* 08/25/2012   LDLCALC 107* 08/25/2012   ALT 65* 08/26/2012   AST 25 08/26/2012   NA 136 12/30/2012   K 3.9 12/30/2012   CL 99 12/30/2012   CREATININE 1.8* 12/30/2012   BUN 17 12/30/2012   CO2 26 12/30/2012   TSH 182.33* 12/30/2012   INR 1.03 08/24/2012   HGBA1C 5.9* 08/24/2012       Assessment & Plan:

## 2013-04-18 NOTE — Patient Instructions (Signed)
Health Maintenance, Males A healthy lifestyle and preventative care can promote health and wellness.  Maintain regular health, dental, and eye exams.  Eat a healthy diet. Foods like vegetables, fruits, whole grains, low-fat dairy products, and lean protein foods contain the nutrients you need without too many calories. Decrease your intake of foods high in solid fats, added sugars, and salt. Get information about a proper diet from your caregiver, if necessary.  Regular physical exercise is one of the most important things you can do for your health. Most adults should get at least 150 minutes of moderate-intensity exercise (any activity that increases your heart rate and causes you to sweat) each week. In addition, most adults need muscle-strengthening exercises on 2 or more days a week.   Maintain a healthy weight. The body mass index (BMI) is a screening tool to identify possible weight problems. It provides an estimate of body fat based on height and weight. Your caregiver can help determine your BMI, and can help you achieve or maintain a healthy weight. For adults 20 years and older:  A BMI below 18.5 is considered underweight.  A BMI of 18.5 to 24.9 is normal.  A BMI of 25 to 29.9 is considered overweight.  A BMI of 30 and above is considered obese.  Maintain normal blood lipids and cholesterol by exercising and minimizing your intake of saturated fat. Eat a balanced diet with plenty of fruits and vegetables. Blood tests for lipids and cholesterol should begin at age 20 and be repeated every 5 years. If your lipid or cholesterol levels are high, you are over 50, or you are a high risk for heart disease, you may need your cholesterol levels checked more frequently.Ongoing high lipid and cholesterol levels should be treated with medicines, if diet and exercise are not effective.  If you smoke, find out from your caregiver how to quit. If you do not use tobacco, do not start.  If you  choose to drink alcohol, do not exceed 2 drinks per day. One drink is considered to be 12 ounces (355 mL) of beer, 5 ounces (148 mL) of wine, or 1.5 ounces (44 mL) of liquor.  Avoid use of street drugs. Do not share needles with anyone. Ask for help if you need support or instructions about stopping the use of drugs.  High blood pressure causes heart disease and increases the risk of stroke. Blood pressure should be checked at least every 1 to 2 years. Ongoing high blood pressure should be treated with medicines if weight loss and exercise are not effective.  If you are 45 to 54 years old, ask your caregiver if you should take aspirin to prevent heart disease.  Diabetes screening involves taking a blood sample to check your fasting blood sugar level. This should be done once every 3 years, after age 45, if you are within normal weight and without risk factors for diabetes. Testing should be considered at a younger age or be carried out more frequently if you are overweight and have at least 1 risk factor for diabetes.  Colorectal cancer can be detected and often prevented. Most routine colorectal cancer screening begins at the age of 50 and continues through age 75. However, your caregiver may recommend screening at an earlier age if you have risk factors for colon cancer. On a yearly basis, your caregiver may provide home test kits to check for hidden blood in the stool. Use of a small camera at the end of a tube,   to directly examine the colon (sigmoidoscopy or colonoscopy), can detect the earliest forms of colorectal cancer. Talk to your caregiver about this at age 50, when routine screening begins. Direct examination of the colon should be repeated every 5 to 10 years through age 75, unless early forms of pre-cancerous polyps or small growths are found.  Hepatitis C blood testing is recommended for all people born from 1945 through 1965 and any individual with known risks for hepatitis C.  Healthy  men should no longer receive prostate-specific antigen (PSA) blood tests as part of routine cancer screening. Consult with your caregiver about prostate cancer screening.  Testicular cancer screening is not recommended for adolescents or adult males who have no symptoms. Screening includes self-exam, caregiver exam, and other screening tests. Consult with your caregiver about any symptoms you have or any concerns you have about testicular cancer.  Practice safe sex. Use condoms and avoid high-risk sexual practices to reduce the spread of sexually transmitted infections (STIs).  Use sunscreen with a sun protection factor (SPF) of 30 or greater. Apply sunscreen liberally and repeatedly throughout the day. You should seek shade when your shadow is shorter than you. Protect yourself by wearing long sleeves, pants, a wide-brimmed hat, and sunglasses year round, whenever you are outdoors.  Notify your caregiver of new moles or changes in moles, especially if there is a change in shape or color. Also notify your caregiver if a mole is larger than the size of a pencil eraser.  A one-time screening for abdominal aortic aneurysm (AAA) and surgical repair of large AAAs by sound wave imaging (ultrasonography) is recommended for ages 65 to 75 years who are current or former smokers.  Stay current with your immunizations. Document Released: 04/02/2008 Document Revised: 12/28/2011 Document Reviewed: 03/02/2011 [employer] Patient Information 2014 [employer].  

## 2013-04-19 NOTE — Assessment & Plan Note (Signed)
He appears to be euthyroid I will recheck his TFT's today 

## 2013-04-19 NOTE — Assessment & Plan Note (Signed)
His BP is well controlled 

## 2013-04-19 NOTE — Assessment & Plan Note (Signed)
Exam done Vaccines were updated Labs ordered Pt ed material was given 

## 2013-04-19 NOTE — Assessment & Plan Note (Signed)
I think this is caused by the Coreg I will check his labs to look for secondary causes He will try cialis (he tells me that he does not use NTG)

## 2013-04-20 ENCOUNTER — Other Ambulatory Visit (INDEPENDENT_AMBULATORY_CARE_PROVIDER_SITE_OTHER): Payer: 59

## 2013-04-20 DIAGNOSIS — E0590 Thyrotoxicosis, unspecified without thyrotoxic crisis or storm: Secondary | ICD-10-CM

## 2013-04-20 DIAGNOSIS — R7989 Other specified abnormal findings of blood chemistry: Secondary | ICD-10-CM

## 2013-04-20 DIAGNOSIS — Z0000 Encounter for general adult medical examination without abnormal findings: Secondary | ICD-10-CM

## 2013-04-20 DIAGNOSIS — R946 Abnormal results of thyroid function studies: Secondary | ICD-10-CM

## 2013-04-20 LAB — LIPID PANEL
Cholesterol: 116 mg/dL (ref 0–200)
HDL: 31.2 mg/dL — ABNORMAL LOW (ref >39.00)
LDL Cholesterol: 57 mg/dL (ref 0–99)
Triglycerides: 138 mg/dL (ref 0.0–149.0)
VLDL: 27.6 mg/dL (ref 0.0–40.0)

## 2013-04-20 LAB — COMPREHENSIVE METABOLIC PANEL
AST: 26 U/L (ref 0–37)
Albumin: 3.8 g/dL (ref 3.5–5.2)
BUN: 14 mg/dL (ref 6–23)
CO2: 30 mEq/L (ref 19–32)
Calcium: 9.8 mg/dL (ref 8.4–10.5)
Chloride: 105 mEq/L (ref 96–112)
Creatinine, Ser: 0.9 mg/dL (ref 0.4–1.5)
GFR: 116.08 mL/min (ref >60.00)
Glucose, Bld: 115 mg/dL — ABNORMAL HIGH (ref 70–99)
Potassium: 4.5 mEq/L (ref 3.5–5.1)

## 2013-04-20 LAB — CBC WITH DIFFERENTIAL/PLATELET
Basophils Absolute: 0 10*3/uL (ref 0.0–0.1)
Basophils Relative: 0.4 % (ref 0.0–3.0)
Eosinophils Absolute: 0.2 10*3/uL (ref 0.0–0.7)
Lymphocytes Relative: 43.2 % (ref 12.0–46.0)
MCHC: 33.8 g/dL (ref 30.0–36.0)
Monocytes Relative: 11.2 % (ref 3.0–12.0)
Neutrophils Relative %: 42.1 % — ABNORMAL LOW (ref 43.0–77.0)
RBC: 4.02 Mil/uL — ABNORMAL LOW (ref 4.22–5.81)

## 2013-04-20 LAB — URINALYSIS, ROUTINE W REFLEX MICROSCOPIC
Bilirubin Urine: NEGATIVE
Hgb urine dipstick: NEGATIVE
Ketones, ur: NEGATIVE
Nitrite: NEGATIVE
Total Protein, Urine: NEGATIVE

## 2013-04-21 LAB — T4: T4, Total: 20.7 ug/dL — ABNORMAL HIGH (ref 5.0–12.5)

## 2013-04-23 ENCOUNTER — Other Ambulatory Visit: Payer: Self-pay | Admitting: Internal Medicine

## 2013-04-23 ENCOUNTER — Encounter: Payer: Self-pay | Admitting: Internal Medicine

## 2013-04-23 DIAGNOSIS — E0590 Thyrotoxicosis, unspecified without thyrotoxic crisis or storm: Secondary | ICD-10-CM

## 2013-05-08 ENCOUNTER — Encounter: Payer: Self-pay | Admitting: Endocrinology

## 2013-05-08 ENCOUNTER — Ambulatory Visit (INDEPENDENT_AMBULATORY_CARE_PROVIDER_SITE_OTHER): Payer: 59 | Admitting: Endocrinology

## 2013-05-08 VITALS — BP 188/60 | HR 84 | Temp 98.4°F | Resp 12 | Ht 73.0 in | Wt 234.4 lb

## 2013-05-08 DIAGNOSIS — E0590 Thyrotoxicosis, unspecified without thyrotoxic crisis or storm: Secondary | ICD-10-CM

## 2013-05-08 NOTE — Patient Instructions (Addendum)
To decide on medication or Radioactive iodine

## 2013-05-08 NOTE — Progress Notes (Signed)
Patient ID: Alejandro Reyes, male   DOB: 06-24-1961, 52 y.o.   MRN: [id number]  Reason for Appointment:  Hyperthyroidism, new visit    History of Present Illness:   The Hyperthyroidism was first diagnosed in 11/13   The symptoms consistent with hyperthyroidism were:  tiredness and some shakiness. He did not have any palpitations, nervousness, feeling excessively warm and sweaty or weight loss initially.  However he was diagnosed only when he went to the emergency room with symptoms of searing chest pain over a period of 2-3 months.  He had an admission in October for chest pain and again in November. In November he was diagnosed to have an MI At that time he was found to have marked hyperthyroidism with a free T4 of 5.5  He apparently was given antithyroid drugs at the time of discharge in 11/13 but he did not followup with any PCP since he was not established with any one.  According to discharge summary he was given 60 mg of Tapazole However he was feeling better with his fatigue and shakiness on treatment; he was under the care of his cardiologist but not for his thyroid condition No further thyroid levels apparently done until 3/14 He was trying to lose weight after his MI with cutting back on his calories and high fat foods and he thinks he lost 40 pounds with this  He apparently continued his 60 mg dose of Tapazole until he ran out in early March At that time he presented to his primary care doctor with weight gain and was found to be significantly hypothyroid He was supposed to be seen by an endocrinologist at bedtime but did not make an appointment  RECENT history: He is now complaining about getting shakiness of his hands at times without nervousness. He does not think he is losing weight or having any palpitations, heat intolerance or sweating. He does get tired at times but he thinks this may be from his work schedule    Lab Results  Component Value Date   TSH 0.04* 04/20/2013   TSH 182.33* 12/30/2012   TSH <0.008* 08/24/2012   TSH <0.008* 08/15/2012   TSH 0.727 11/15/2009   FREET4 0.02* 12/30/2012   FREET4 5.50* 08/24/2012   Total T4 on 04/20/13 = 20.7 and free T3 is significantly increased at 7.5  I 131 uptake not done    Medication List       This list is accurate as of: 05/08/13  3:57 PM.  Always use your most recent med list.               aspirin 81 MG EC tablet  Take 1 tablet (81 mg total) by mouth daily.     atorvastatin 40 MG tablet  Commonly known as:  LIPITOR  Take 1 tablet (40 mg total) by mouth daily at 6 PM.     carvedilol 6.25 MG tablet  Commonly known as:  COREG  Take 1 tablet (6.25 mg total) by mouth 2 (two) times daily with a meal.     losartan-hydrochlorothiazide 100-25 MG per tablet  Commonly known as:  HYZAAR  Take 1 tablet by mouth daily.     pantoprazole 40 MG tablet  Commonly known as:  PROTONIX  Take 1 tablet (40 mg total) by mouth 2 (two) times daily before a meal.     potassium chloride SA 20 MEQ tablet  Commonly known as:  K-DUR,KLOR-CON  Take 2 tablets (40 mEq total) by mouth daily.  tadalafil 5 MG tablet  Commonly known as:  CIALIS  Take 1 tablet (5 mg total) by mouth daily as needed for erectile dysfunction.            Past Medical History  Diagnosis Date  . Hypertension   . Chronic systolic CHF (congestive heart failure), NYHA class 1   . Tobacco abuse   . COPD (chronic obstructive pulmonary disease)   . GERD (gastroesophageal reflux disease)   . Alcohol abuse, in remission   . Hyperthyroidism   . CAD (coronary artery disease)   . Pericarditis   . Pneumonia     Past Surgical History  Procedure Laterality Date  . Tonsillectomy    . Esophagogastroduodenoscopy  08/26/2012    Procedure: ESOPHAGOGASTRODUODENOSCOPY (EGD);  Surgeon: Louis Meckel, MD;  Location: [facility] ENDOSCOPY;  Service: Endoscopy;  Laterality: N/A;  . Appendectomy  1976    Family History  Problem Relation Age of Onset  .  Hypertension Father   . Cancer - Other Father     SCCa of Oropharynx  . Kidney disease Father   . Alcohol abuse Father   . Kidney disease Brother   . Heart disease Maternal Aunt   . Heart disease Paternal Aunt   . Heart disease Maternal Grandfather   . Cancer Neg Hx   . Diabetes Neg Hx   . Early death Neg Hx   . Hyperlipidemia Neg Hx   . Stroke Neg Hx     Social History:  reports that he has quit smoking. His smoking use included Cigarettes. He smoked 0.00 packs per day. He has never used smokeless tobacco. He reports that he does not drink alcohol or use illicit drugs.  Allergies: No Known Allergies  Review of Systems:  Occasionally may have mild headaches. CARDIOLOGY: he has a history of high blood pressure diagnosed in 11/13. history of MI, recently no chest pain      RESPIRATORY:  no dyspnea on exertion.      GASTROENTEROLOGY:  no Change in bowel habits.   history of reflux   ENDOCRINOLOGY:  no history of Diabetes. nonfasting glucose 115 recently History of hypercholesterolemia, tolerating Lipitor History of erectile dysfunction            Examination:   BP 188/60  Pulse 84  Temp(Src) 98.4 F (36.9 C)  Resp 12  Ht 6\' 1"  (1.854 m)  Wt 234 lb 6.4 oz (106.323 kg)  BMI 30.93 kg/m2  SpO2 97%   General Appearance: pleasant, not anxious or hyperkinetic. Well-built and nourished.          Eyes: No proptosis or eyelid swelling, no lid lag or stare . Oromucosa and tongue are normal           Neck: The thyroid is enlarged about twice normal, smooth, non-tender and diffuse, slightly firm and nodular on the right side. There is no lymphadenopathy .          Heart: normal S1 and S2, no murmurs.         Lungs: breath sounds are clear bilaterally  Extremities: hands are warm but not moist. No clubbing No ankle edema. Neurological: REFLEXES: at biceps are brisk.         TREMORS: bilateral fine tremors are present..     Assessments    Hyperthyroidism - 242.90  Clinically he  appears to have Graves' disease with significant hyperthyroidism for several months . He has not been monitored or followed with any regularity and initially treated  with 60 mg of methimazole after his MI in 11/13 Also has poor knowledge about the disease and management Symptomatically he has very atypical presentation with symptoms mostly in the form of tremors; also may have had some weight loss from hypothyroidism as well as nonspecific GI symptoms  He did have transient hypothyroidism in March related to continuing high dose methimazole, subsequently has had recurrence of his hypothyroidism with no current treatment He has reduced symptoms partly from being on a beta blocker for his cardiac condition. He does have a mild thyroid enlargement  Because of his goiter and significant degree of persistent hyperthyroidism as judged by his labs his ideal treatment would be with I-131ablation. Discussed hyperthyroidism and Graves' disease in detail and the nature of the condition, etiology and natural history  He was given the options of I-131 and antithyroid drugs.  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 He is somewhat reluctant to consider I-131 at this time and will review the information given and call back with a decision Will not start antithyroid drugs at this point since he will need I-131 uptake if he decides on the radioactive iodine treatment  Total visit time including review of records and counseling today = 50 minutes   Aphrodite Harpenau 05/08/2013, 3:57 PM

## 2013-05-10 ENCOUNTER — Encounter: Payer: Self-pay | Admitting: Endocrinology

## 2013-06-01 ENCOUNTER — Other Ambulatory Visit: Payer: 59

## 2013-06-01 ENCOUNTER — Other Ambulatory Visit (INDEPENDENT_AMBULATORY_CARE_PROVIDER_SITE_OTHER): Payer: 59

## 2013-06-01 DIAGNOSIS — E039 Hypothyroidism, unspecified: Secondary | ICD-10-CM

## 2013-06-01 DIAGNOSIS — E0590 Thyrotoxicosis, unspecified without thyrotoxic crisis or storm: Secondary | ICD-10-CM

## 2013-06-01 LAB — T4, FREE: Free T4: 1.9 ng/dL — ABNORMAL HIGH (ref 0.60–1.60)

## 2013-06-06 ENCOUNTER — Encounter: Payer: Self-pay | Admitting: Endocrinology

## 2013-06-06 ENCOUNTER — Ambulatory Visit (INDEPENDENT_AMBULATORY_CARE_PROVIDER_SITE_OTHER): Payer: 59 | Admitting: Endocrinology

## 2013-06-06 VITALS — BP 128/60 | HR 70 | Temp 98.1°F | Resp 12 | Ht 73.0 in | Wt 233.0 lb

## 2013-06-06 DIAGNOSIS — E0590 Thyrotoxicosis, unspecified without thyrotoxic crisis or storm: Secondary | ICD-10-CM

## 2013-06-06 NOTE — Patient Instructions (Addendum)
To be scheduled at [facility]

## 2013-06-06 NOTE — Progress Notes (Signed)
Patient ID: Alejandro Reyes, male   DOB: 1961/01/06, 52 y.o.   MRN: [id number]  Reason for Appointment:  Hyperthyroidism, new visit    History of Present Illness:   The Hyperthyroidism was first diagnosed in 11/13  Past history:  The symptoms consistent with hyperthyroidism at the time of diagnosis  were:  tiredness and some shakiness but no palpitations. He did not have any  nervousness, feeling excessively warm and sweaty or weight loss initially. In November 2013 he had an MI At that time he was found to have marked hyperthyroidism with a free T4 of 5.5  He apparently was given antithyroid drugs at the time of discharge in 11/13 but he did not followup with any PCP since he was not established with any one.  According to discharge summary he was given 60 mg of Tapazole However he was feeling better with his fatigue and shakiness on treatment; he was under the care of his cardiologist but not for his thyroid condition No further thyroid levels apparently done until 3/14 He was trying to lose weight after his MI with cutting back on his calories and high fat foods and he thinks he lost 40 pounds with this He apparently continued his 60 mg dose of Tapazole until he ran out in early March At that time he presented to his primary care doctor with weight gain and was found to be significantly hyperthyroid  RECENT history: He is still complaining about getting shakiness of his hands at times without nervousness. He does not think he is losing weight or having any palpitations, heat intolerance or sweating. He does get tired at times. He was asked to decide between antithyroid drugs and radioactive iodine treatment on his last visit but he did not call back and is still undecided about the type of treatment he wants    Appointment on 06/01/2013  Component Date Value Range Status  . TSH 06/01/2013 0.04* 0.35 - 5.50 uIU/mL Final  . Free T4 06/01/2013 1.90* 0.60 - 1.60 ng/dL Final  . T3, Free  40/98/1191 6.0* 2.3 - 4.2 pg/mL Final    Lab Results  Component Value Date   TSH 0.04* 06/01/2013   TSH 0.04* 04/20/2013   TSH 182.33* 12/30/2012   TSH <0.008* 08/24/2012   TSH <0.008* 08/15/2012   FREET4 1.90* 06/01/2013   FREET4 0.02* 12/30/2012   FREET4 5.50* 08/24/2012   Total T4 on 04/20/13 = 20.7 and free T3 is significantly increased at 7.5  I 131 uptake not done    Medication List       This list is accurate as of: 06/06/13  8:10 AM.  Always use your most recent med list.               aspirin 81 MG EC tablet  Take 1 tablet (81 mg total) by mouth daily.     atorvastatin 40 MG tablet  Commonly known as:  LIPITOR  Take 1 tablet (40 mg total) by mouth daily at 6 PM.     carvedilol 6.25 MG tablet  Commonly known as:  COREG  Take 1 tablet (6.25 mg total) by mouth 2 (two) times daily with a meal.     losartan-hydrochlorothiazide 100-25 MG per tablet  Commonly known as:  HYZAAR  Take 1 tablet by mouth daily.     pantoprazole 40 MG tablet  Commonly known as:  PROTONIX  Take 1 tablet (40 mg total) by mouth 2 (two) times daily before a meal.  potassium chloride SA 20 MEQ tablet  Commonly known as:  K-DUR,KLOR-CON  Take 2 tablets (40 mEq total) by mouth daily.     tadalafil 5 MG tablet  Commonly known as:  CIALIS  Take 1 tablet (5 mg total) by mouth daily as needed for erectile dysfunction.            Past Medical History  Diagnosis Date  . Hypertension   . Chronic systolic CHF (congestive heart failure), NYHA class 1   . Tobacco abuse   . COPD (chronic obstructive pulmonary disease)   . GERD (gastroesophageal reflux disease)   . Alcohol abuse, in remission   . Hyperthyroidism   . CAD (coronary artery disease)   . Pericarditis   . Pneumonia     Past Surgical History  Procedure Laterality Date  . Tonsillectomy    . Esophagogastroduodenoscopy  08/26/2012    Procedure: ESOPHAGOGASTRODUODENOSCOPY (EGD);  Surgeon: Louis Meckel, MD;  Location: [facility]  ENDOSCOPY;  Service: Endoscopy;  Laterality: N/A;  . Appendectomy  1976    Family History  Problem Relation Age of Onset  . Hypertension Father   . Cancer - Other Father     SCCa of Oropharynx  . Kidney disease Father   . Alcohol abuse Father   . Kidney disease Brother   . Heart disease Maternal Aunt   . Heart disease Paternal Aunt   . Heart disease Maternal Grandfather   . Cancer Neg Hx   . Diabetes Neg Hx   . Early death Neg Hx   . Hyperlipidemia Neg Hx   . Stroke Neg Hx     Social History:  reports that he has quit smoking. His smoking use included Cigarettes. He smoked 0.00 packs per day. He has never used smokeless tobacco. He reports that he does not drink alcohol or use illicit drugs.  Allergies: No Known Allergies  Review of Systems:  Occasionally may have mild headaches. CARDIOLOGY: he has a history of high blood pressure diagnosed in 11/13. history of MI, recently no chest pain      ENDOCRINOLOGY:  no history of Diabetes. nonfasting glucose 115 recently History of hypercholesterolemia, tolerating Lipitor History of erectile dysfunction            Examination:   BP 128/60  Pulse 70  Temp(Src) 98.1 F (36.7 C)  Resp 12  Ht 6\' 1"  (1.854 m)  Wt 233 lb (105.688 kg)  BMI 30.75 kg/m2  SpO2 98%   General Appearance: pleasant, not anxious or hyperkinetic. Well-built and nourished.          Eyes: No proptosis or eyelid swelling, no lid lag or stare . Neck: The thyroid is enlarged about twice normal, smooth, non-tender and diffuse, slightly firm and nodular on the right side.   Extremities: hands are warm but not moist. No clubbing No ankle edema. Neurological: REFLEXES: at biceps are brisk.         TREMORS: no fine tremors are present..     Assessments    Hyperthyroidism - 242.90  He has Graves' disease with significant hyperthyroidism since at least 11/13. Current levels are still significantly high  Because of his goiter and significant degree of  persistent hyperthyroidism as judged by his labs his ideal treatment would be with I-131ablation. 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 He is agreeable  to consider I-131 at this time and will schedule for I-131 uptake  Francine Hannan 06/06/2013, 8:10 AM

## 2013-06-13 ENCOUNTER — Encounter (HOSPITAL_COMMUNITY)
Admission: RE | Admit: 2013-06-13 | Discharge: 2013-06-13 | Disposition: A | Discharge status: Home / Self Care | Payer: 59 | Source: Ambulatory Visit | Attending: Endocrinology | Admitting: Endocrinology

## 2013-06-13 DIAGNOSIS — E0590 Thyrotoxicosis, unspecified without thyrotoxic crisis or storm: Secondary | ICD-10-CM | POA: Insufficient documentation

## 2013-06-13 MED ORDER — SODIUM IODIDE I 131 CAPSULE
12.4200 | Freq: Once | INTRAVENOUS | Status: AC | PRN
  Administered 2013-06-13: 12.42 via ORAL

## 2013-06-14 ENCOUNTER — Encounter (HOSPITAL_COMMUNITY)
Admission: RE | Admit: 2013-06-14 | Discharge: 2013-06-14 | Disposition: A | Discharge status: Home / Self Care | Payer: 59 | Source: Ambulatory Visit | Attending: Endocrinology | Admitting: Endocrinology

## 2013-06-15 ENCOUNTER — Other Ambulatory Visit: Payer: Self-pay | Admitting: Endocrinology

## 2013-06-15 DIAGNOSIS — E0590 Thyrotoxicosis, unspecified without thyrotoxic crisis or storm: Secondary | ICD-10-CM

## 2013-06-20 ENCOUNTER — Telehealth: Payer: Self-pay | Admitting: *Deleted

## 2013-06-20 NOTE — Telephone Encounter (Signed)
Message copied by Hermenia Bers on Tue Jun 20, 2013  1:41 PM ------      Message from: Reather Littler      Created: Thu Jun 15, 2013  9:36 PM       Test is moderately increased, He will be scheduled for I-131 treatment, have made referral ------

## 2013-06-20 NOTE — Telephone Encounter (Signed)
Phone number is not a working number.

## 2013-07-07 ENCOUNTER — Encounter (HOSPITAL_COMMUNITY): Payer: 59

## 2013-07-11 ENCOUNTER — Ambulatory Visit (INDEPENDENT_AMBULATORY_CARE_PROVIDER_SITE_OTHER): Payer: 59 | Admitting: Internal Medicine

## 2013-07-11 ENCOUNTER — Encounter: Payer: Self-pay | Admitting: Internal Medicine

## 2013-07-11 VITALS — BP 130/90 | HR 87 | Temp 97.8°F | Ht 73.0 in | Wt 241.1 lb

## 2013-07-11 DIAGNOSIS — I10 Essential (primary) hypertension: Secondary | ICD-10-CM

## 2013-07-11 DIAGNOSIS — R51 Headache: Secondary | ICD-10-CM

## 2013-07-11 DIAGNOSIS — R519 Headache, unspecified: Secondary | ICD-10-CM | POA: Insufficient documentation

## 2013-07-11 DIAGNOSIS — N529 Male erectile dysfunction, unspecified: Secondary | ICD-10-CM

## 2013-07-11 DIAGNOSIS — G4700 Insomnia, unspecified: Secondary | ICD-10-CM | POA: Insufficient documentation

## 2013-07-11 DIAGNOSIS — R011 Cardiac murmur, unspecified: Secondary | ICD-10-CM

## 2013-07-11 MED ORDER — IBUPROFEN 800 MG PO TABS: 800.0000 mg | ORAL_TABLET | Freq: Three times a day (TID) | ORAL | Status: DC | PRN

## 2013-07-11 MED ORDER — ZOLPIDEM TARTRATE 10 MG PO TABS: 10.0000 mg | ORAL_TABLET | Freq: Every evening | ORAL | Status: DC | PRN

## 2013-07-11 MED ORDER — TADALAFIL 5 MG PO TABS: 5.0000 mg | ORAL_TABLET | Freq: Every day | ORAL | Status: DC | PRN

## 2013-07-11 MED ORDER — KETOROLAC TROMETHAMINE 30 MG/ML IJ SOLN
30.0000 mg | Freq: Once | INTRAMUSCULAR | Status: AC
  Administered 2013-07-11: 30 mg via INTRAMUSCULAR

## 2013-07-11 NOTE — Assessment & Plan Note (Signed)
Difficult to sleep x 2 wks due to pain and I suspect possibly hyperthyroid, works third shift as well, for [employer] prn,  to f/u any worsening symptoms or concerns

## 2013-07-11 NOTE — Progress Notes (Signed)
Subjective:    Patient ID: Alejandro Reyes, male    DOB: [birth year], 52 y.o.   MRN: [id number]  HPI  Here to f/u with c/o right sided HA, scalp tenderness for 2 wks, daily, mild to mod, constant, worse to lie on right side at night on pillow, very hard to sleep as he keeps turning on that side, works third shift as well. Pt denies chest pain, increased sob or doe, wheezing, orthopnea, PND, increased LE swelling, palpitations, dizziness or syncope.  Pt denies new neurological symptoms such as facial or extremity weakness or numbness.  Also asks for cialis refill or sample and cialis has worked ok. Past Medical History  Diagnosis Date  . Hypertension   . Chronic systolic CHF (congestive heart failure), NYHA class 1   . Tobacco abuse   . COPD (chronic obstructive pulmonary disease)   . GERD (gastroesophageal reflux disease)   . Alcohol abuse, in remission   . Hyperthyroidism   . CAD (coronary artery disease)   . Pericarditis   . Pneumonia    Past Surgical History  Procedure Laterality Date  . Tonsillectomy    . Esophagogastroduodenoscopy  08/26/2012    Procedure: ESOPHAGOGASTRODUODENOSCOPY (EGD);  Surgeon: Louis Meckel, MD;  Location: [facility] ENDOSCOPY;  Service: Endoscopy;  Laterality: N/A;  . Appendectomy  1976    reports that he has quit smoking. His smoking use included Cigarettes. He smoked 0.00 packs per day. He has never used smokeless tobacco. He reports that he does not drink alcohol or use illicit drugs. family history includes Alcohol abuse in his father; Cancer - Other in his father; Heart disease in his maternal aunt, maternal grandfather, and paternal aunt; Hypertension in his father; Kidney disease in his brother and father. There is no history of Cancer, Diabetes, Early death, Hyperlipidemia, or Stroke. No Known Allergies Current Outpatient Prescriptions on File Prior to Visit  Medication Sig Dispense Refill  . aspirin EC 81 MG EC tablet Take 1 tablet (81 mg total) by mouth  daily.      Marland Kitchen atorvastatin (LIPITOR) 40 MG tablet Take 1 tablet (40 mg total) by mouth daily at 6 PM.  30 tablet  6  . carvedilol (COREG) 6.25 MG tablet Take 1 tablet (6.25 mg total) by mouth 2 (two) times daily with a meal.  60 tablet  5  . losartan-hydrochlorothiazide (HYZAAR) 100-25 MG per tablet Take 1 tablet by mouth daily.  90 tablet  1  . pantoprazole (PROTONIX) 40 MG tablet Take 1 tablet (40 mg total) by mouth 2 (two) times daily before a meal.  60 tablet  6  . potassium chloride SA (K-DUR,KLOR-CON) 20 MEQ tablet Take 2 tablets (40 mEq total) by mouth daily.  60 tablet  6   No current facility-administered medications on file prior to visit.   Review of Systems  Constitutional: Negative for unexpected weight change, or unusual diaphoresis  HENT: Negative for tinnitus.   Eyes: Negative for photophobia and visual disturbance.  Respiratory: Negative for choking and stridor.   Gastrointestinal: Negative for vomiting and blood in stool.  Genitourinary: Negative for hematuria and decreased urine volume.  Musculoskeletal: Negative for acute joint swelling Skin: Negative for color change and wound.  Neurological: Negative for tremors and numbness other than noted  Psychiatric/Behavioral: Negative for decreased concentration or  hyperactivity.       Objective:   Physical Exam BP 130/90  Pulse 87  Temp(Src) 97.8 F (36.6 C) (Oral)  Ht 6\' 1"  (1.854 m)  Wt  241 lb 2 oz (109.374 kg)  BMI 31.82 kg/m2  SpO2 98% VS noted,  Constitutional: Pt appears well-developed and well-nourished.  HENT: Head: NCAT.  Right Ear: External ear normal.  Left Ear: External ear normal.  Eyes: Conjunctivae and EOM are normal. Pupils are equal, round, and reactive to light.  Neck: Normal range of motion. Neck supple.  Cardiovascular: Normal rate and regular rhythm.  with gr 2/6 sys murmur RUSB Pulmonary/Chest: Effort normal and breath sounds normal.  Abd:  Soft, NT, non-distended, + BS Neurological: Pt is  alert. Not confused , motor 5/5, cn 2-12 intact, has some diffuse tender to right side head Skin: Skin is warm. No erythema.  Psychiatric: Pt behavior is normal. Thought content normal.     Assessment & Plan:

## 2013-07-11 NOTE — Assessment & Plan Note (Signed)
stable overall by history and exam, recent data reviewed with pt, and pt to continue medical treatment as before,  to f/u any worsening symptoms or concerns BP Readings from Last 3 Encounters:  07/11/13 130/90  06/06/13 128/60  05/08/13 188/60

## 2013-07-11 NOTE — Assessment & Plan Note (Signed)
Suspect most likely recurring tension vs migraine right side HA, for toradol IM today, ibuprofen prn, consider MRI , f/u with PCP if persists

## 2013-07-11 NOTE — Assessment & Plan Note (Signed)
Asks for cialias prn refill, for refills today

## 2013-07-11 NOTE — Assessment & Plan Note (Signed)
Incidental today, suspect aortic, echo nov 2013 without signficant aoritc valve issue, afeb, asympt, ok to follow for now, ? Due to high flow state due to hyperthyroid?

## 2013-07-11 NOTE — Patient Instructions (Signed)
You had the pain shot (toradol) today Please take all new medication as prescribed - the higher dose ibuprofen as needed for pain, and the ambien for sleep Please continue all other medications as before, and refills have been done if requested - the cialis Please see Dr Yetta Barre if the pain not improved, to consider MRI No need for further Echo at this time for the heart murmur  Please remember to sign up for My Chart if you have not done so, as this will be important to you in the future with finding out test results, communicating by private email, and scheduling acute appointments online when needed.

## 2013-07-13 ENCOUNTER — Other Ambulatory Visit: Payer: Self-pay | Admitting: Cardiovascular Disease

## 2013-07-18 ENCOUNTER — Encounter: Payer: Self-pay | Admitting: Endocrinology

## 2013-07-18 ENCOUNTER — Ambulatory Visit (INDEPENDENT_AMBULATORY_CARE_PROVIDER_SITE_OTHER): Payer: 59 | Admitting: Endocrinology

## 2013-07-18 VITALS — BP 142/84 | HR 63 | Temp 98.2°F | Resp 12 | Ht 73.0 in | Wt 245.7 lb

## 2013-07-18 DIAGNOSIS — E0590 Thyrotoxicosis, unspecified without thyrotoxic crisis or storm: Secondary | ICD-10-CM

## 2013-07-18 LAB — TSH: TSH: 0.01 u[IU]/mL — ABNORMAL LOW (ref 0.35–5.50)

## 2013-07-18 NOTE — Progress Notes (Signed)
Patient ID: Alejandro Reyes, male   DOB: 10/18/1961, 52 y.o.   MRN: [id number]  Reason for Appointment:  Hyperthyroidism, followup visit    History of Present Illness:    Past history: Hyperthyroidism was first diagnosed in 11/13 The symptoms consistent with hyperthyroidism at the time of diagnosis  were:  tiredness and some shakiness but no palpitations. He did not have any  nervousness, feeling excessively warm and sweaty or weight loss initially. In November 2013 he had an MI and at that time he was found to have marked hyperthyroidism with a free T4 of 5.5 He was given 60 mg of Tapazole at the time of discharge in 11/13 but he did not followup with any PCP  However he was feeling better with his fatigue and shakiness on treatment; he was under the care of his cardiologist but not for his thyroid condition No further thyroid levels apparently done until 3/14 He was trying to lose weight after his MI with cutting back on his calories and high fat foods and he thinks he lost 40 pounds with this He apparently continued his 60 mg dose of Tapazole until he ran out in early March At that time he presented to his primary care doctor with weight gain and was found to be significantly hyperthyroid  RECENT history: He was retreated with Tapazole but because of his persistent hyperthyroidism and significantly high levels he was scheduled for I-131 treatment last month. His uptake was 43%. However he was not able to be contacted for scheduling the treatment  And he was assuming that the uptake was actually the treatment He is still complaining about getting shakiness of his hands at times without nervousness. He did not feel unusually tired. He has not been taking his methimazole in preparation for the I-131 treatment.  He appears to be gaining weight and does not have any palpitations, heat or cold intolerance or sweating.    No visits with results within 1 Week(s) from this visit. Latest known visit  with results is:  Appointment on 06/01/2013  Component Date Value Range Status  . TSH 06/01/2013 0.04* 0.35 - 5.50 uIU/mL Final  . Free T4 06/01/2013 1.90* 0.60 - 1.60 ng/dL Final  . T3, Free 40/98/1191 6.0* 2.3 - 4.2 pg/mL Final    Lab Results  Component Value Date   TSH 0.04* 06/01/2013   TSH 0.04* 04/20/2013   TSH 182.33* 12/30/2012   TSH <0.008* 08/24/2012   TSH <0.008* 08/15/2012   FREET4 1.90* 06/01/2013   FREET4 0.02* 12/30/2012   FREET4 5.50* 08/24/2012   Total T4 on 04/20/13 = 20.7 and free T3 is significantly increased at 7.5  I 131 uptake not done    Medication List       This list is accurate as of: 07/18/13  8:06 AM.  Always use your most recent med list.               aspirin 81 MG EC tablet  Take 1 tablet (81 mg total) by mouth daily.     atorvastatin 40 MG tablet  Commonly known as:  LIPITOR  Take 1 tablet (40 mg total) by mouth daily at 6 PM.     carvedilol 6.25 MG tablet  Commonly known as:  COREG  Take 1 tablet (6.25 mg total) by mouth 2 (two) times daily with a meal.     ibuprofen 800 MG tablet  Commonly known as:  ADVIL,MOTRIN  Take 1 tablet (800 mg total) by mouth every  8 (eight) hours as needed for pain.     losartan-hydrochlorothiazide 100-25 MG per tablet  Commonly known as:  HYZAAR  TAKE 1 TABLET BY MOUTH DAILY.     pantoprazole 40 MG tablet  Commonly known as:  PROTONIX  Take 1 tablet (40 mg total) by mouth 2 (two) times daily before a meal.     potassium chloride SA 20 MEQ tablet  Commonly known as:  K-DUR,KLOR-CON  Take 2 tablets (40 mEq total) by mouth daily.     tadalafil 5 MG tablet  Commonly known as:  CIALIS  Take 1 tablet (5 mg total) by mouth daily as needed for erectile dysfunction.     zolpidem 10 MG tablet  Commonly known as:  AMBIEN  Take 1 tablet (10 mg total) by mouth at bedtime as needed for sleep.            Past Medical History  Diagnosis Date  . Hypertension   . Chronic systolic CHF (congestive heart  failure), NYHA class 1   . Tobacco abuse   . COPD (chronic obstructive pulmonary disease)   . GERD (gastroesophageal reflux disease)   . Alcohol abuse, in remission   . Hyperthyroidism   . CAD (coronary artery disease)   . Pericarditis   . Pneumonia     Past Surgical History  Procedure Laterality Date  . Tonsillectomy    . Esophagogastroduodenoscopy  08/26/2012    Procedure: ESOPHAGOGASTRODUODENOSCOPY (EGD);  Surgeon: Louis Meckel, MD;  Location: [facility] ENDOSCOPY;  Service: Endoscopy;  Laterality: N/A;  . Appendectomy  1976    Family History  Problem Relation Age of Onset  . Hypertension Father   . Cancer - Other Father     SCCa of Oropharynx  . Kidney disease Father   . Alcohol abuse Father   . Kidney disease Brother   . Heart disease Maternal Aunt   . Heart disease Paternal Aunt   . Heart disease Maternal Grandfather   . Cancer Neg Hx   . Diabetes Neg Hx   . Early death Neg Hx   . Hyperlipidemia Neg Hx   . Stroke Neg Hx     Social History:  reports that he has quit smoking. His smoking use included Cigarettes. He smoked 0.00 packs per day. He has never used smokeless tobacco. He reports that he does not drink alcohol or use illicit drugs.  Allergies: No Known Allergies  Review of Systems:  He has been complaining of severe headaches for the last month or so and has seen PCP last Friday. Still having headaches CARDIOLOGY: he has a history of high blood pressure and has not been able to get his losartan HCTZ refilled recently     ENDOCRINOLOGY:  no history of Diabetes. nonfasting glucose 115  History of erectile dysfunction            Examination:   BP 142/84  Pulse 63  Temp(Src) 98.2 F (36.8 C)  Resp 12  Ht 6\' 1"  (1.854 m)  Wt 245 lb 11.2 oz (111.449 kg)  BMI 32.42 kg/m2  SpO2 98%   General Appearance: pleasant, not anxious or hyperkinetic. Well-built and nourished.          Eyes: No proptosis or eyelid swelling, no stare . Neck: The thyroid is enlarged  about twice normal, smooth, , slightly firm and nodular on the right side.   Extremities: hands are warm but not moist. No ankle edema. Neurological: REFLEXES: at biceps are normal.  TREMORS: no fine tremors are present..     Assessments    Hyperthyroidism - 242.90  He has had Graves' disease with significant hyperthyroidism since at least 11/13.  He was supposed to have the I-131 treatment about a month ago but could not be contacted to schedule this However surprisingly without any antithyroid drugs he does not have any tachycardia and has gained weight. Does have some tremor today  Plan: Will check his thyroid levels to confirm his hyperthyroid state and have him scheduled for I-131 treatment. He will followup in 6 weeks and not restart methimazole He was asked  to followup with PCP for his persistent headaches   Derl Abalos 07/18/2013, 8:06 AM

## 2013-07-18 NOTE — Patient Instructions (Addendum)
To be scheduled for !131 treatment

## 2013-07-19 ENCOUNTER — Telehealth: Payer: Self-pay | Admitting: *Deleted

## 2013-07-19 NOTE — Telephone Encounter (Signed)
Message copied by Hermenia Bers on Wed Jul 19, 2013  8:29 AM ------      Message from: Alejandro Reyes      Created: Wed Jul 19, 2013  8:11 AM       Please let patient know that the lab result is normal and no radioactive iodine treatment needed at this time, keep followup appointment ------

## 2013-07-19 NOTE — Progress Notes (Signed)
Quick Note:  Please let patient know that the lab result is normal and no radioactive iodine treatment needed at this time, keep followup appointment ______

## 2013-07-19 NOTE — Telephone Encounter (Signed)
Left message to return call 

## 2013-07-25 ENCOUNTER — Encounter: Payer: Self-pay | Admitting: *Deleted

## 2013-07-28 ENCOUNTER — Telehealth: Payer: Self-pay | Admitting: *Deleted

## 2013-08-01 NOTE — Telephone Encounter (Signed)
Letter mailed

## 2013-08-17 ENCOUNTER — Other Ambulatory Visit: Payer: Self-pay | Admitting: Cardiovascular Disease

## 2013-08-17 ENCOUNTER — Other Ambulatory Visit: Payer: Self-pay | Admitting: Nurse Practitioner

## 2013-08-24 ENCOUNTER — Other Ambulatory Visit: Payer: Self-pay | Admitting: *Deleted

## 2013-08-24 ENCOUNTER — Other Ambulatory Visit: Payer: 59

## 2013-08-24 ENCOUNTER — Other Ambulatory Visit (INDEPENDENT_AMBULATORY_CARE_PROVIDER_SITE_OTHER): Payer: 59

## 2013-08-24 DIAGNOSIS — E0590 Thyrotoxicosis, unspecified without thyrotoxic crisis or storm: Secondary | ICD-10-CM

## 2013-08-24 LAB — T4, FREE: Free T4: 2.01 ng/dL — ABNORMAL HIGH (ref 0.60–1.60)

## 2013-08-24 LAB — TSH: TSH: 0.02 u[IU]/mL — ABNORMAL LOW (ref 0.35–5.50)

## 2013-08-29 ENCOUNTER — Ambulatory Visit: Payer: 59 | Admitting: Endocrinology

## 2013-08-29 ENCOUNTER — Encounter: Payer: Self-pay | Admitting: *Deleted

## 2013-08-29 DIAGNOSIS — Z0289 Encounter for other administrative examinations: Secondary | ICD-10-CM

## 2013-08-31 ENCOUNTER — Emergency Department (HOSPITAL_COMMUNITY): Payer: 59

## 2013-08-31 ENCOUNTER — Encounter (HOSPITAL_COMMUNITY): Payer: Self-pay | Admitting: Emergency Medicine

## 2013-08-31 ENCOUNTER — Emergency Department (HOSPITAL_COMMUNITY)
Admission: EM | Admit: 2013-08-31 | Discharge: 2013-08-31 | Disposition: A | Discharge status: Home / Self Care | Payer: 59 | Attending: Emergency Medicine | Admitting: Emergency Medicine

## 2013-08-31 DIAGNOSIS — Z8701 Personal history of pneumonia (recurrent): Secondary | ICD-10-CM | POA: Insufficient documentation

## 2013-08-31 DIAGNOSIS — I5022 Chronic systolic (congestive) heart failure: Secondary | ICD-10-CM | POA: Insufficient documentation

## 2013-08-31 DIAGNOSIS — I10 Essential (primary) hypertension: Secondary | ICD-10-CM | POA: Insufficient documentation

## 2013-08-31 DIAGNOSIS — Z79899 Other long term (current) drug therapy: Secondary | ICD-10-CM | POA: Insufficient documentation

## 2013-08-31 DIAGNOSIS — R51 Headache: Secondary | ICD-10-CM | POA: Insufficient documentation

## 2013-08-31 DIAGNOSIS — Z862 Personal history of diseases of the blood and blood-forming organs and certain disorders involving the immune mechanism: Secondary | ICD-10-CM | POA: Insufficient documentation

## 2013-08-31 DIAGNOSIS — Z87891 Personal history of nicotine dependence: Secondary | ICD-10-CM | POA: Insufficient documentation

## 2013-08-31 DIAGNOSIS — Z7982 Long term (current) use of aspirin: Secondary | ICD-10-CM | POA: Insufficient documentation

## 2013-08-31 DIAGNOSIS — I2510 Atherosclerotic heart disease of native coronary artery without angina pectoris: Secondary | ICD-10-CM | POA: Insufficient documentation

## 2013-08-31 DIAGNOSIS — Z8639 Personal history of other endocrine, nutritional and metabolic disease: Secondary | ICD-10-CM | POA: Insufficient documentation

## 2013-08-31 DIAGNOSIS — K219 Gastro-esophageal reflux disease without esophagitis: Secondary | ICD-10-CM | POA: Insufficient documentation

## 2013-08-31 DIAGNOSIS — J4489 Other specified chronic obstructive pulmonary disease: Secondary | ICD-10-CM | POA: Insufficient documentation

## 2013-08-31 DIAGNOSIS — J449 Chronic obstructive pulmonary disease, unspecified: Secondary | ICD-10-CM | POA: Insufficient documentation

## 2013-08-31 DIAGNOSIS — H53149 Visual discomfort, unspecified: Secondary | ICD-10-CM | POA: Insufficient documentation

## 2013-08-31 LAB — CBC
MCV: 83.5 fL (ref 78.0–100.0)
Platelets: 154 10*3/uL (ref 150–400)
RDW: 12.9 % (ref 11.5–15.5)
WBC: 5.6 10*3/uL (ref 4.0–10.5)

## 2013-08-31 LAB — SEDIMENTATION RATE: Sed Rate: 9 mm/hr (ref 0–16)

## 2013-08-31 LAB — POCT I-STAT, CHEM 8
Calcium, Ion: 1.23 mmol/L (ref 1.12–1.23)
Chloride: 101 mEq/L (ref 96–112)
HCT: 47 % (ref 39.0–52.0)

## 2013-08-31 MED ORDER — DIPHENHYDRAMINE HCL 50 MG/ML IJ SOLN
25.0000 mg | Freq: Once | INTRAMUSCULAR | Status: AC
  Administered 2013-08-31: 25 mg via INTRAVENOUS
  Filled 2013-08-31: qty 1

## 2013-08-31 MED ORDER — METOCLOPRAMIDE HCL 5 MG/ML IJ SOLN
10.0000 mg | Freq: Once | INTRAMUSCULAR | Status: AC
  Administered 2013-08-31: 10 mg via INTRAVENOUS
  Filled 2013-08-31: qty 2

## 2013-08-31 NOTE — ED Notes (Signed)
Pt reports severe migraine for three days, right side of head and behind right eye. Denies blurry vision and denies weakness, numbness, or tingling in any particular side. Pt ambulatory to room with slow, steady gait.

## 2013-08-31 NOTE — ED Notes (Signed)
Care transferred, report received. 

## 2013-08-31 NOTE — ED Provider Notes (Signed)
CSN: [id number]     Arrival date & time 08/31/13  0546 History   First MD Initiated Contact with Patient 08/31/13 0601     Chief Complaint  Patient presents with  . Migraine   (Consider location/radiation/quality/duration/timing/severity/associated sxs/prior Treatment) HPI Comments: Patients with no significant past medical history of headaches presents with complaints of 3 days of severe, sharp, headache behind his right eye radiating to his right occiput. Patient states that the pain eases off at times but has been constant since approximately 10 PM. Patient has photophobia when pain is worse. No nausea or vomiting. No tearing of eyes or rhinorrhea. Patient states that he had several similar episodes in August and September after he had radioiodine therapy. He saw PCP and received an injection of ibuprofen'. No vision change. No current toothaches or sinus infection symptoms. Patient did not hit head. He does not have weakness, numbness, or tingling in arms or his legs and he is ambulating normally. He has taken ibuprofen with minimal relief. The onset of this condition was acute. The course is constant.    The history is provided by the patient.    Past Medical History  Diagnosis Date  . Hypertension   . Chronic systolic CHF (congestive heart failure), NYHA class 1   . Tobacco abuse   . COPD (chronic obstructive pulmonary disease)   . GERD (gastroesophageal reflux disease)   . Alcohol abuse, in remission   . Hyperthyroidism   . CAD (coronary artery disease)   . Pericarditis   . Pneumonia    Past Surgical History  Procedure Laterality Date  . Tonsillectomy    . Esophagogastroduodenoscopy  08/26/2012    Procedure: ESOPHAGOGASTRODUODENOSCOPY (EGD);  Surgeon: Louis Meckel, MD;  Location: [facility] ENDOSCOPY;  Service: Endoscopy;  Laterality: N/A;  . Appendectomy  1976   Family History  Problem Relation Age of Onset  . Hypertension Father   . Cancer - Other Father     SCCa of  Oropharynx  . Kidney disease Father   . Alcohol abuse Father   . Kidney disease Brother   . Heart disease Maternal Aunt   . Heart disease Paternal Aunt   . Heart disease Maternal Grandfather   . Cancer Neg Hx   . Diabetes Neg Hx   . Early death Neg Hx   . Hyperlipidemia Neg Hx   . Stroke Neg Hx    History  Substance Use Topics  . Smoking status: Former Smoker    Types: Cigarettes  . Smokeless tobacco: Never Used     Comment: pt has stopped smoking for a few days, uses nicorette gum  . Alcohol Use: No    Review of Systems  Constitutional: Negative for fever.  HENT: Negative for congestion, dental problem, rhinorrhea and sinus pressure.   Eyes: Negative for photophobia, discharge, redness and visual disturbance.  Respiratory: Negative for shortness of breath.   Cardiovascular: Negative for chest pain.  Gastrointestinal: Negative for nausea and vomiting.  Musculoskeletal: Negative for gait problem, neck pain and neck stiffness.  Skin: Negative for rash.  Neurological: Positive for headaches. Negative for syncope, speech difficulty, weakness, light-headedness and numbness.  Psychiatric/Behavioral: Negative for confusion.    Allergies  Review of patient's allergies indicates no known allergies.  Home Medications   Current Outpatient Rx  Name  Route  Sig  Dispense  Refill  . aspirin EC 81 MG EC tablet   Oral   Take 1 tablet (81 mg total) by mouth daily.         Marland Kitchen  atorvastatin (LIPITOR) 40 MG tablet      TAKE 1 TABLET BY MOUTH DAILY AT 6 PM.   30 tablet   3   . carvedilol (COREG) 6.25 MG tablet      TAKE 1 TABLET BY MOUTH 2 TIMES DAILY WITH A MEAL.   60 tablet   3   . ibuprofen (ADVIL,MOTRIN) 800 MG tablet   Oral   Take 1 tablet (800 mg total) by mouth every 8 (eight) hours as needed for pain.   40 tablet   1   . losartan-hydrochlorothiazide (HYZAAR) 100-25 MG per tablet      TAKE 1 TABLET BY MOUTH DAILY.   90 tablet   1   . pantoprazole (PROTONIX) 40  MG tablet   Oral   Take 1 tablet (40 mg total) by mouth 2 (two) times daily before a meal.   60 tablet   6   . potassium chloride SA (K-DUR,KLOR-CON) 20 MEQ tablet   Oral   Take 2 tablets (40 mEq total) by mouth daily.   60 tablet   6   . tadalafil (CIALIS) 5 MG tablet   Oral   Take 1 tablet (5 mg total) by mouth daily as needed for erectile dysfunction.   10 tablet   11   . EXPIRED: zolpidem (AMBIEN) 10 MG tablet   Oral   Take 1 tablet (10 mg total) by mouth at bedtime as needed for sleep.   30 tablet   1    BP 154/83  Pulse 90  Temp(Src) 98.1 F (36.7 C) (Oral)  Resp 18  Ht 6\' 1"  (1.854 m)  Wt 237 lb (107.502 kg)  BMI 31.27 kg/m2  SpO2 97% Physical Exam  Nursing note and vitals reviewed. Constitutional: He is oriented to person, place, and time. He appears well-developed and well-nourished.  HENT:  Head: Normocephalic and atraumatic. Head is without raccoon's eyes and without Battle's sign.  Right Ear: Tympanic membrane, external ear and ear canal normal. No hemotympanum.  Left Ear: Tympanic membrane, external ear and ear canal normal. No hemotympanum.  Nose: Nose normal. No nasal septal hematoma.  Mouth/Throat: Oropharynx is clear and moist.  Tenderness to palpation over R temporal area, no bruit.   Eyes: Conjunctivae, EOM and lids are normal. Pupils are equal, round, and reactive to light.  No visible hyphema  Neck: Normal range of motion. Neck supple.  Cardiovascular: Normal rate and regular rhythm.   Pulmonary/Chest: Effort normal and breath sounds normal.  Abdominal: Soft. There is no tenderness.  Musculoskeletal: Normal range of motion.       Cervical back: He exhibits normal range of motion, no tenderness and no bony tenderness.       Thoracic back: He exhibits no tenderness and no bony tenderness.       Lumbar back: He exhibits no tenderness and no bony tenderness.  Neurological: He is alert and oriented to person, place, and time. He has normal  strength and normal reflexes. No cranial nerve deficit or sensory deficit. Coordination normal. GCS eye subscore is 4. GCS verbal subscore is 5. GCS motor subscore is 6.  Skin: Skin is warm and dry.  Psychiatric: He has a normal mood and affect.    ED Course  Procedures (including critical care time) Labs Review Labs Reviewed  POCT I-STAT, CHEM 8 - Abnormal; Notable for the following:    Glucose, Bld 109 (*)    All other components within normal limits  CBC  SEDIMENTATION RATE  Imaging Review No results found.  EKG Interpretation   None      6:13 AM Patient seen and examined. Work-up initiated. Medications ordered.   Vital signs reviewed and are as follows: Filed Vitals:   08/31/13 0552  BP: 154/83  Pulse: 90  Temp: 98.1 F (36.7 C)  Resp: 18   7:32 AM Patient reports pain now 4/10. He is more comfortable. Pending ESR. Informed of head CT findings.   7:46 AM Pt informed of sed rate results. Pt is comfortable with discharge to home.   Patient urged to return with worsening symptoms, change in headache, confusion, trouble moving arms/legs, walking or other concerns. Patient verbalized understanding and agrees with plan.   Patient encouraged to f/u with his PCP for further eval.     MDM   1. Headache    Patient with HA. Has features of migraine (unilateral, sharp) and also cluster HA (similar sx in Aug/Sept that resolved, pain behind eye, sharp). No fever or meningismus. TTP over temporal area but no vision change, ESR nml. CT does not show bleeding or tumor. Doubt that this represents sentinel bleeding or impending aneurysm rupture given presentation and timeline. Pt will need to f/u with PCP for further eval.     Renne Crigler, PA-C 08/31/13 [phone]

## 2013-08-31 NOTE — ED Notes (Signed)
PA Josh at bedside. 

## 2013-09-01 NOTE — ED Provider Notes (Signed)
Medical screening examination/treatment/procedure(s) were performed by non-physician practitioner and as supervising physician I was immediately available for consultation/collaboration.  Sunnie Nielsen, MD 09/01/13 [phone]

## 2013-11-03 ENCOUNTER — Ambulatory Visit (INDEPENDENT_AMBULATORY_CARE_PROVIDER_SITE_OTHER): Payer: 59 | Admitting: Internal Medicine

## 2013-11-03 ENCOUNTER — Inpatient Hospital Stay (HOSPITAL_COMMUNITY)
Admission: EM | Admit: 2013-11-03 | Discharge: 2013-11-04 | DRG: 287 | Disposition: A | Discharge status: Home / Self Care | Payer: 59 | Attending: Internal Medicine | Admitting: Internal Medicine

## 2013-11-03 ENCOUNTER — Emergency Department (HOSPITAL_COMMUNITY): Payer: 59

## 2013-11-03 ENCOUNTER — Encounter: Payer: Self-pay | Admitting: Internal Medicine

## 2013-11-03 ENCOUNTER — Encounter (HOSPITAL_COMMUNITY)
Admission: EM | Disposition: A | Discharge status: Home / Self Care | Payer: Self-pay | Source: Home / Self Care | Attending: Internal Medicine

## 2013-11-03 ENCOUNTER — Encounter (HOSPITAL_COMMUNITY): Payer: Self-pay | Admitting: Emergency Medicine

## 2013-11-03 VITALS — BP 148/80 | HR 120 | Temp 97.7°F | Resp 16 | Wt 230.0 lb

## 2013-11-03 DIAGNOSIS — J449 Chronic obstructive pulmonary disease, unspecified: Secondary | ICD-10-CM | POA: Diagnosis present

## 2013-11-03 DIAGNOSIS — Z8639 Personal history of other endocrine, nutritional and metabolic disease: Secondary | ICD-10-CM | POA: Diagnosis present

## 2013-11-03 DIAGNOSIS — I10 Essential (primary) hypertension: Secondary | ICD-10-CM

## 2013-11-03 DIAGNOSIS — R7989 Other specified abnormal findings of blood chemistry: Secondary | ICD-10-CM | POA: Diagnosis present

## 2013-11-03 DIAGNOSIS — I517 Cardiomegaly: Secondary | ICD-10-CM

## 2013-11-03 DIAGNOSIS — I509 Heart failure, unspecified: Principal | ICD-10-CM | POA: Diagnosis present

## 2013-11-03 DIAGNOSIS — I2510 Atherosclerotic heart disease of native coronary artery without angina pectoris: Secondary | ICD-10-CM | POA: Diagnosis present

## 2013-11-03 DIAGNOSIS — R778 Other specified abnormalities of plasma proteins: Secondary | ICD-10-CM | POA: Diagnosis present

## 2013-11-03 DIAGNOSIS — E0590 Thyrotoxicosis, unspecified without thyrotoxic crisis or storm: Secondary | ICD-10-CM

## 2013-11-03 DIAGNOSIS — Z79899 Other long term (current) drug therapy: Secondary | ICD-10-CM

## 2013-11-03 DIAGNOSIS — E0500 Thyrotoxicosis with diffuse goiter without thyrotoxic crisis or storm: Secondary | ICD-10-CM | POA: Diagnosis present

## 2013-11-03 DIAGNOSIS — J4489 Other specified chronic obstructive pulmonary disease: Secondary | ICD-10-CM | POA: Diagnosis present

## 2013-11-03 DIAGNOSIS — I214 Non-ST elevation (NSTEMI) myocardial infarction: Secondary | ICD-10-CM

## 2013-11-03 DIAGNOSIS — Z7982 Long term (current) use of aspirin: Secondary | ICD-10-CM

## 2013-11-03 DIAGNOSIS — I200 Unstable angina: Secondary | ICD-10-CM

## 2013-11-03 DIAGNOSIS — I5022 Chronic systolic (congestive) heart failure: Secondary | ICD-10-CM | POA: Diagnosis present

## 2013-11-03 DIAGNOSIS — F17200 Nicotine dependence, unspecified, uncomplicated: Secondary | ICD-10-CM | POA: Diagnosis present

## 2013-11-03 DIAGNOSIS — R079 Chest pain, unspecified: Secondary | ICD-10-CM

## 2013-11-03 DIAGNOSIS — K219 Gastro-esophageal reflux disease without esophagitis: Secondary | ICD-10-CM | POA: Diagnosis present

## 2013-11-03 HISTORY — DX: Personal history of other endocrine, nutritional and metabolic disease: Z86.39

## 2013-11-03 HISTORY — PX: LEFT HEART CATHETERIZATION WITH CORONARY ANGIOGRAM: SHX5451

## 2013-11-03 LAB — D-DIMER, QUANTITATIVE (NOT AT ARMC)

## 2013-11-03 LAB — BASIC METABOLIC PANEL
BUN: 16 mg/dL (ref 6–23)
CHLORIDE: 99 meq/L (ref 96–112)
CO2: 25 mEq/L (ref 19–32)
CREATININE: 0.86 mg/dL (ref 0.50–1.35)
Calcium: 10 mg/dL (ref 8.4–10.5)
GFR calc non Af Amer: >90 mL/min (ref >90)
Glucose, Bld: 94 mg/dL (ref 70–99)
POTASSIUM: 4 meq/L (ref 3.7–5.3)
Sodium: 137 mEq/L (ref 137–147)

## 2013-11-03 LAB — CBC
HEMATOCRIT: 39.8 % (ref 39.0–52.0)
HEMATOCRIT: 37.6 % — AB (ref 39.0–52.0)
HEMOGLOBIN: 12.5 g/dL — AB (ref 13.0–17.0)
Hemoglobin: 13.9 g/dL (ref 13.0–17.0)
MCH: 29.2 pg (ref 26.0–34.0)
MCH: 28.6 pg (ref 26.0–34.0)
MCHC: 34.9 g/dL (ref 30.0–36.0)
MCHC: 33.2 g/dL (ref 30.0–36.0)
MCV: 83.6 fL (ref 78.0–100.0)
MCV: 86 fL (ref 78.0–100.0)
PLATELETS: 157 10*3/uL (ref 150–400)
Platelets: 133 10*3/uL — ABNORMAL LOW (ref 150–400)
RBC: 4.76 MIL/uL (ref 4.22–5.81)
RBC: 4.37 MIL/uL (ref 4.22–5.81)
RDW: 12.6 % (ref 11.5–15.5)
RDW: 12.8 % (ref 11.5–15.5)
WBC: 5.5 10*3/uL (ref 4.0–10.5)
WBC: 5.4 10*3/uL (ref 4.0–10.5)

## 2013-11-03 LAB — HEMOGLOBIN A1C
HEMOGLOBIN A1C: 5.9 % — AB (ref <5.7)
Mean Plasma Glucose: 123 mg/dL — ABNORMAL HIGH (ref <117)

## 2013-11-03 LAB — CREATININE, SERUM
Creatinine, Ser: 0.79 mg/dL (ref 0.50–1.35)
GFR calc Af Amer: >90 mL/min (ref >90)
GFR calc non Af Amer: >90 mL/min (ref >90)

## 2013-11-03 LAB — T4, FREE: FREE T4: 6.02 ng/dL — AB (ref 0.80–1.80)

## 2013-11-03 LAB — PROTIME-INR
INR: 1.07 (ref 0.00–1.49)
PROTHROMBIN TIME: 13.7 s (ref 11.6–15.2)

## 2013-11-03 LAB — PRO B NATRIURETIC PEPTIDE: Pro B Natriuretic peptide (BNP): 89.1 pg/mL (ref 0–125)

## 2013-11-03 LAB — TSH: TSH: <0.008 u[IU]/mL — ABNORMAL LOW (ref 0.350–4.500)

## 2013-11-03 LAB — POCT I-STAT TROPONIN I: TROPONIN I, POC: 0.35 ng/mL — AB (ref 0.00–0.08)

## 2013-11-03 LAB — TROPONIN I
TROPONIN I: 0.55 ng/mL — AB (ref <0.30)
TROPONIN I: 0.45 ng/mL — AB (ref <0.30)

## 2013-11-03 LAB — MAGNESIUM: Magnesium: 1.7 mg/dL (ref 1.5–2.5)

## 2013-11-03 LAB — APTT: aPTT: 49 seconds — ABNORMAL HIGH (ref 24–37)

## 2013-11-03 SURGERY — LEFT HEART CATHETERIZATION WITH CORONARY ANGIOGRAM
Anesthesia: LOCAL

## 2013-11-03 MED ORDER — METOPROLOL TARTRATE 50 MG PO TABS
50.0000 mg | ORAL_TABLET | Freq: Two times a day (BID) | ORAL | Status: DC
  Administered 2013-11-03 – 2013-11-04 (×2): 50 mg via ORAL
  Filled 2013-11-03 (×3): qty 1

## 2013-11-03 MED ORDER — SODIUM CHLORIDE 0.9 % IV BOLUS (SEPSIS)
250.0000 mL | Freq: Once | INTRAVENOUS | Status: AC
  Administered 2013-11-03: 250 mL via INTRAVENOUS

## 2013-11-03 MED ORDER — SODIUM CHLORIDE 0.9 % IV SOLN
INTRAVENOUS | Status: DC
  Administered 2013-11-03: 13:00:00 via INTRAVENOUS

## 2013-11-03 MED ORDER — ZOLPIDEM TARTRATE 5 MG PO TABS
5.0000 mg | ORAL_TABLET | Freq: Every evening | ORAL | Status: DC | PRN
Start: 2013-11-03 — End: 2013-11-04

## 2013-11-03 MED ORDER — ASPIRIN 325 MG PO TABS: 325.0000 mg | ORAL_TABLET | Freq: Every day | ORAL | Status: DC

## 2013-11-03 MED ORDER — NITROGLYCERIN 2 % TD OINT
1.0000 [in_us] | TOPICAL_OINTMENT | Freq: Four times a day (QID) | TRANSDERMAL | Status: DC
  Administered 2013-11-03: 1 [in_us] via TOPICAL
  Filled 2013-11-03: qty 1

## 2013-11-03 MED ORDER — VERAPAMIL HCL 2.5 MG/ML IV SOLN
INTRAVENOUS | Status: AC
  Filled 2013-11-03: qty 2

## 2013-11-03 MED ORDER — HEPARIN (PORCINE) IN NACL 100-0.45 UNIT/ML-% IJ SOLN: 14.0000 [IU]/kg/h | Freq: Once | INTRAMUSCULAR | Status: DC

## 2013-11-03 MED ORDER — ONDANSETRON HCL 4 MG/2ML IJ SOLN: 4.0000 mg | Freq: Four times a day (QID) | INTRAMUSCULAR | Status: DC | PRN

## 2013-11-03 MED ORDER — ONDANSETRON HCL 4 MG/2ML IJ SOLN
4.0000 mg | Freq: Four times a day (QID) | INTRAMUSCULAR | Status: DC | PRN
Start: 2013-11-03 — End: 2013-11-04

## 2013-11-03 MED ORDER — ASPIRIN 81 MG PO CHEW: 81.0000 mg | CHEWABLE_TABLET | ORAL | Status: DC

## 2013-11-03 MED ORDER — HEPARIN SODIUM (PORCINE) 5000 UNIT/ML IJ SOLN
4000.0000 [IU] | Freq: Once | INTRAMUSCULAR | Status: AC
  Administered 2013-11-03: 4000 [IU] via INTRAVENOUS

## 2013-11-03 MED ORDER — LOSARTAN POTASSIUM 50 MG PO TABS
100.0000 mg | ORAL_TABLET | Freq: Every day | ORAL | Status: DC
  Administered 2013-11-04: 100 mg via ORAL
  Filled 2013-11-03 (×2): qty 2

## 2013-11-03 MED ORDER — NITROGLYCERIN 0.4 MG SL SUBL
0.4000 mg | SUBLINGUAL_TABLET | SUBLINGUAL | Status: DC | PRN
Start: 2013-11-03 — End: 2013-11-04
  Administered 2013-11-04: 0.4 mg via SUBLINGUAL
  Filled 2013-11-03: qty 25

## 2013-11-03 MED ORDER — PANTOPRAZOLE SODIUM 40 MG PO TBEC
40.0000 mg | DELAYED_RELEASE_TABLET | Freq: Two times a day (BID) | ORAL | Status: DC
  Administered 2013-11-03 – 2013-11-04 (×2): 40 mg via ORAL
  Filled 2013-11-03 (×2): qty 1

## 2013-11-03 MED ORDER — NITROGLYCERIN 0.4 MG SL SUBL: 0.4000 mg | SUBLINGUAL_TABLET | SUBLINGUAL | Status: DC | PRN

## 2013-11-03 MED ORDER — HEPARIN (PORCINE) IN NACL 100-0.45 UNIT/ML-% IJ SOLN
1250.0000 [IU]/h | INTRAMUSCULAR | Status: DC
  Administered 2013-11-03: 1250 [IU]/h via INTRAVENOUS
  Filled 2013-11-03: qty 250

## 2013-11-03 MED ORDER — HEPARIN SODIUM (PORCINE) 1000 UNIT/ML IJ SOLN
INTRAMUSCULAR | Status: AC
  Filled 2013-11-03: qty 1

## 2013-11-03 MED ORDER — ASPIRIN EC 325 MG PO TBEC
325.0000 mg | DELAYED_RELEASE_TABLET | Freq: Every day | ORAL | Status: DC
  Administered 2013-11-04: 325 mg via ORAL
  Filled 2013-11-03: qty 1

## 2013-11-03 MED ORDER — NITROGLYCERIN 0.4 MG SL SUBL
0.4000 mg | SUBLINGUAL_TABLET | SUBLINGUAL | Status: DC | PRN
  Administered 2013-11-03: 0.4 mg via SUBLINGUAL
  Filled 2013-11-03: qty 25

## 2013-11-03 MED ORDER — SODIUM CHLORIDE 0.9 % IV SOLN: INTRAVENOUS | Status: AC

## 2013-11-03 MED ORDER — ACETAMINOPHEN 325 MG PO TABS: 650.0000 mg | ORAL_TABLET | ORAL | Status: DC | PRN

## 2013-11-03 MED ORDER — SODIUM CHLORIDE 0.9 % IJ SOLN: 3.0000 mL | INTRAMUSCULAR | Status: DC | PRN

## 2013-11-03 MED ORDER — ISOSORBIDE MONONITRATE ER 30 MG PO TB24: 30.0000 mg | ORAL_TABLET | Freq: Every day | ORAL | Status: DC

## 2013-11-03 MED ORDER — MIDAZOLAM HCL 2 MG/2ML IJ SOLN
INTRAMUSCULAR | Status: AC
  Filled 2013-11-03: qty 2

## 2013-11-03 MED ORDER — ENOXAPARIN SODIUM 40 MG/0.4ML ~~LOC~~ SOLN
40.0000 mg | SUBCUTANEOUS | Status: DC
  Administered 2013-11-04: 40 mg via SUBCUTANEOUS
  Filled 2013-11-03 (×2): qty 0.4

## 2013-11-03 MED ORDER — NITROGLYCERIN 0.2 MG/ML ON CALL CATH LAB
INTRAVENOUS | Status: AC
  Filled 2013-11-03: qty 1

## 2013-11-03 MED ORDER — HYDROCHLOROTHIAZIDE 25 MG PO TABS
25.0000 mg | ORAL_TABLET | Freq: Every day | ORAL | Status: DC
  Administered 2013-11-03 – 2013-11-04 (×2): 25 mg via ORAL
  Filled 2013-11-03 (×2): qty 1

## 2013-11-03 MED ORDER — DIAZEPAM 5 MG PO TABS
5.0000 mg | ORAL_TABLET | ORAL | Status: AC
  Administered 2013-11-03: 5 mg via ORAL
  Filled 2013-11-03: qty 1

## 2013-11-03 MED ORDER — ATORVASTATIN CALCIUM 40 MG PO TABS
40.0000 mg | ORAL_TABLET | Freq: Every day | ORAL | Status: DC
  Administered 2013-11-03 – 2013-11-04 (×2): 40 mg via ORAL
  Filled 2013-11-03 (×2): qty 1

## 2013-11-03 MED ORDER — SODIUM CHLORIDE 0.9 % IV SOLN: INTRAVENOUS | Status: DC

## 2013-11-03 MED ORDER — HEPARIN (PORCINE) IN NACL 2-0.9 UNIT/ML-% IJ SOLN
INTRAMUSCULAR | Status: AC
  Filled 2013-11-03: qty 1000

## 2013-11-03 MED ORDER — ISOSORBIDE MONONITRATE ER 30 MG PO TB24
30.0000 mg | ORAL_TABLET | Freq: Every day | ORAL | Status: DC
  Administered 2013-11-04: 30 mg via ORAL
  Filled 2013-11-03: qty 1

## 2013-11-03 MED ORDER — FENTANYL CITRATE 0.05 MG/ML IJ SOLN
INTRAMUSCULAR | Status: AC
  Filled 2013-11-03: qty 2

## 2013-11-03 MED ORDER — ALPRAZOLAM 0.25 MG PO TABS: 0.2500 mg | ORAL_TABLET | Freq: Two times a day (BID) | ORAL | Status: DC | PRN

## 2013-11-03 MED ORDER — LOSARTAN POTASSIUM-HCTZ 100-25 MG PO TABS: 1.0000 | ORAL_TABLET | Freq: Every day | ORAL | Status: DC

## 2013-11-03 MED ORDER — LIDOCAINE HCL (PF) 1 % IJ SOLN
INTRAMUSCULAR | Status: AC
  Filled 2013-11-03: qty 30

## 2013-11-03 MED ORDER — SODIUM CHLORIDE 0.9 % IV SOLN: 250.0000 mL | INTRAVENOUS | Status: DC

## 2013-11-03 MED ORDER — SODIUM CHLORIDE 0.9 % IJ SOLN: 3.0000 mL | Freq: Two times a day (BID) | INTRAMUSCULAR | Status: DC

## 2013-11-03 NOTE — CV Procedure (Signed)
Cardiac Cath Procedure Note:  Indication: NSTEMI  Procedures performed:  1) Selective coronary angiography 2) Left heart catheterization 3) Left ventriculogram  Description of procedure:   The risks and indication of the procedure were explained. Consent was signed and placed on the chart. An appropriate timeout was taken prior to the procedure. After a normal Allen's test was confirmed, the right wrist was prepped and draped in the routine sterile fashion and anesthetized with 1% local lidocaine.   A 5 FR arterial sheath was then placed in the right radial artery using a modified Seldinger technique. Systemic heparin was administered. 3mg  IV verapamil was given through the sheath. Standard catheters including a JL 3.5, JR4 and straight pigtail were used. All catheter exchanges were made over a wire.  Complications:  None apparent  Findings:  Ao Pressure: 109/73 (90) LV Pressure: 117/4/15 There was no signficant gradient across the aortic valve on pullback.  Left main: 20% distal stenosis.  Left Anterior Descending Artery: Large caliber vessel that courses to the apex. The ostium appears to have 20-30% stenosis. The mid vessel has diffuse 20-30% stenosis. There are two small to moderate caliber diagonal branches without focal stenosis.  Circumflex Artery: Large caliber vessel that terminates into a moderate-sized branching obtuse marginal There is mild plaque in the proximal vessel and in the marginal branch. There is a small ramus branch. Right Coronary Artery: Moderate caliber, dominant vessel with diffuse 40-50% mid stenosis. Left Ventricular Angiogram: LVEF 65-70%. Nu wall motion abnormalities   Assessment: 1. Mild to moderate non-obstructive CAD 2. Normal LV function  Plan/Discussion:  Suspect CP may pericarditic in nature. Tachycardia may be related to hyperthyroidism (TSH pending). Continue b-blocker. Check UDS, d-dimer and echo.  Alejandro Reyes 5:29 PM

## 2013-11-03 NOTE — Progress Notes (Signed)
   Subjective:    Patient ID: Alejandro Reyes, male    DOB: 04-01-1961, 53 y.o.   MRN: [id number]  Chest Pain  This is a new problem. The current episode started 1 to 4 weeks ago (10 d). The onset quality is undetermined. The problem occurs intermittently. The problem has been waxing and waning. The pain is present in the substernal region. The pain is at a severity of 10/10. The pain is severe. The quality of the pain is described as heavy, pressure and burning. The pain does not radiate. Associated symptoms include near-syncope, palpitations and shortness of breath. Pertinent negatives include no diaphoresis or syncope. The pain is aggravated by walking and exertion. He has tried nitroglycerin for the symptoms. The treatment provided significant relief.  His past medical history is significant for CAD. Past medical history comments: MI 11/13  He worked 3d shift earlier He is refusing to go to ER due to $150 co-pay No ASA x 1 wk He did not take his med this am    Review of Systems  Constitutional: Negative for chills and diaphoresis.  HENT: Positive for dental problem. Negative for congestion.   Respiratory: Positive for shortness of breath. Negative for wheezing.   Cardiovascular: Positive for chest pain, palpitations and near-syncope. Negative for leg swelling and syncope.  Gastrointestinal: Negative for diarrhea and blood in stool.  Musculoskeletal: Negative for gait problem.  Psychiatric/Behavioral: The patient is nervous/anxious.        Objective:   Physical Exam  Constitutional: He is oriented to person, place, and time. He appears well-developed. No distress.  NAD Obese  HENT:  Mouth/Throat: Oropharynx is clear and moist.  Eyes: Conjunctivae are normal. Pupils are equal, round, and reactive to light.  Neck: Normal range of motion. No JVD present. No thyromegaly present.  Cardiovascular: Regular rhythm, normal heart sounds and intact distal pulses.  Exam reveals no gallop and no  friction rub.   No murmur heard. tachy  Pulmonary/Chest: Effort normal and breath sounds normal. No respiratory distress. He has no wheezes. He has no rales. He exhibits no tenderness.  Abdominal: Soft. Bowel sounds are normal. He exhibits no distension and no mass. There is no tenderness. There is no rebound and no guarding.  Musculoskeletal: Normal range of motion. He exhibits no edema and no tenderness.  Lymphadenopathy:    He has no cervical adenopathy.  Neurological: He is alert and oriented to person, place, and time. He has normal reflexes. No cranial nerve deficit. He exhibits normal muscle tone. He displays a negative Romberg sign. Coordination and gait normal.  No meningeal signs  Skin: Skin is warm and dry. No rash noted.  Psychiatric: He has a normal mood and affect. His behavior is normal. Judgment and thought content normal.    EKG      Assessment & Plan:

## 2013-11-03 NOTE — Progress Notes (Signed)
Pre visit review using our clinic review tool, if applicable. No additional management support is needed unless otherwise documented below in the visit note. 

## 2013-11-03 NOTE — Progress Notes (Signed)
ANTICOAGULATION CONSULT NOTE - Initial Consult  Pharmacy Consult for heparin Indication: chest pain/ACS  No Known Allergies  Patient Measurements: Weight: 229 lb 1.6 oz (103.919 kg)   Vital Signs: Temp: 99.2 F (37.3 C) (01/16 1144) Temp src: Oral (01/16 1144) BP: 152/73 mmHg (01/16 1245) Pulse Rate: 112 (01/16 1245)  Labs:  Recent Labs  11/03/13 1148  HGB 13.9  HCT 39.8  PLT 157  CREATININE 0.86    The CrCl is unknown because both a height and weight (above a minimum accepted value) are required for this calculation.   Medical History: Past Medical History  Diagnosis Date  . Hypertension   . Chronic systolic CHF (congestive heart failure), NYHA class 1   . Tobacco abuse   . COPD (chronic obstructive pulmonary disease)   . GERD (gastroesophageal reflux disease)   . Alcohol abuse, in remission   . Hyperthyroidism   . CAD (coronary artery disease)   . Pericarditis   . Pneumonia     Medications:  No anticoagulants  Assessment: 53 yo man to start heparin per pharmacy for chest pain and positive troponin.  Wt 103.9 kg.  PLTC on the low side but this appears to be his normal with PLCT 154>141>154>154 from 08/28/12 through 08/31/13. H/H 13.9/39.8.  Creat 0.86.  INR 1.03.    Goal of Therapy:  Heparin level 0.3-0.7 units/ml Monitor platelets by anticoagulation protocol: Yes   Plan:  Give 4000 units bolus x 1 Start heparin infusion at 1250 units/hr Check anti-Xa level in 6 hours and daily while on heparin Continue to monitor H&H and platelets  Eudelia Bunch, Pharm.D. [phone] 11/03/2013 1:20 PM

## 2013-11-03 NOTE — Assessment & Plan Note (Signed)
Labs

## 2013-11-03 NOTE — Interval H&P Note (Signed)
History and Physical Interval Note:  11/03/2013 5:00 PM  Alejandro Reyes  has presented today for surgery, with the diagnosis of nstemi  The various methods of treatment have been discussed with the patient and family. After consideration of risks, benefits and other options for treatment, the patient has consented to  Procedure(s): LEFT HEART CATHETERIZATION WITH CORONARY ANGIOGRAM (N/A) possible angioplasty Cath Lab Visit (complete for each Cath Lab visit)  Clinical Evaluation Leading to the Procedure:   ACS: yes  Non-ACS:    Anginal Classification: CCS IV  Anti-ischemic medical therapy: Minimal Therapy (1 class of medications)  Non-Invasive Test Results: No non-invasive testing performed  Prior CABG: No previous CABG      as a surgical intervention .  The patient's history has been reviewed, patient examined, no change in status, stable for surgery.  I have reviewed the patient's chart and labs.  Questions were answered to the patient's satisfaction.     Richmond Coldren

## 2013-11-03 NOTE — ED Notes (Signed)
MD at bedside. Dr. Zackowski. 

## 2013-11-03 NOTE — ED Notes (Signed)
Abnormal lab value troponin 0.35. ED MD and ED RN made aware

## 2013-11-03 NOTE — ED Notes (Signed)
NP from Cardiology at bedside.

## 2013-11-03 NOTE — H&P (Signed)
Alejandro Reyes is an 53 y.o. male.    Primary Cardiologist:Dr. Angelena Form PCP: Scarlette Calico, MD  Chief Complaint: chest pain HPI:  53 year old AAM with hx mild nonobstructive CAD, HTN, GERD, COPD, tobacco abuse and past possible myopericarditis or stress induced CM back in November 2013. Cath at that time (08/24/2012) showed mild disease in the left main, LAD and RCA but no flow limiting lesions. EF was 45%. EF 65% prior to his discharge at that time by echo.. Was also started on Tapazole at that time for hyperthyroidism.  He had been doing well until the last 2 weeks.  He has had mid sternal chest pain, severe, much like the pain in 2013 and associated with occ. SOB and diaphoresis.  No nausea or vomiting, no fevers.  Pain is worse with lying back.  Better when he is working.  He has been taking nexium in addition to protonix without relief.  Yesterday he was at his dentist and had significant chest pain and given 1 NTG sl with complete relief.  Pain eventually returned by 5 pm.  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  Today he went to his PCP office secondary to chest pain and they asked him to come to the ER.     Currently chest pain free but + troponin and EKG with Inf/Lat ST depression compared to 2013.  Also ST at 126.   His hyperthyroidism was treated sporadically until he saw Dr. Dwyane Dee.  He was to stay off the tapazole and be scheduled for I-131 treatment but his labs were normal and no further treatment was needed from the record.  Pt tells me he has been taking the Tapazole.  He tells me he has been loosing wt as well.  Last thyroid panel Nov. 2014 TSH 0.02, Free T4 2.01, T3 306.8  At that time pt was to call back for treatment- he tells me they asked to resume tapazole.    Past Medical History  Diagnosis Date  . Hypertension   . Chronic systolic CHF (congestive heart failure), NYHA class 1   . Tobacco abuse   . COPD (chronic  obstructive pulmonary disease)   . GERD (gastroesophageal reflux disease)   . Alcohol abuse, in remission   . Hyperthyroidism   . CAD (coronary artery disease)   . Pericarditis   . Pneumonia   . Hx of Graves' disease 11/03/2013    Past Surgical History  Procedure Laterality Date  . Tonsillectomy    . Esophagogastroduodenoscopy  08/26/2012    Procedure: ESOPHAGOGASTRODUODENOSCOPY (EGD);  Surgeon: Inda Castle, MD;  Location: [facility];  Service: Endoscopy;  Laterality: N/A;  . Appendectomy  1976    Family History  Problem Relation Age of Onset  . Hypertension Father   . Cancer - Other Father     SCCa of Oropharynx  . Kidney disease Father   . Alcohol abuse Father   . Kidney disease Brother   . Heart disease Maternal Aunt   . Heart disease Paternal Aunt   . Heart disease Maternal Grandfather   . Cancer Neg Hx   . Diabetes Neg Hx   . Early death Neg Hx   . Hyperlipidemia Neg Hx   . Stroke Neg Hx    Social History:  reports that he has been smoking Cigarettes.  He has been smoking about 0.00 packs per day. He has never  used smokeless tobacco. He reports that he does not drink alcohol or use illicit drugs. He has stopped smoking   Allergies: No Known Allergies  OUTPATIENT Medications: Lipitor 40 mg daily Coreg 6.25 mg BID hyzaar 100-25 i daily protonix 40 mg daily Kdur 20 meq daily. Asa 81 mg daily Ibuprofen 800 mg prn cialis 5 mg prn.last use 2 weeks ago. No NTG at home Pt states he has been taking tapazole.   Results for orders placed during the hospital encounter of 11/03/13 (from the past 48 hour(s))  CBC     Status: None   Collection Time    11/03/13 11:48 AM      Result Value Range   WBC 5.5  4.0 - 10.5 K/uL   RBC 4.76  4.22 - 5.81 MIL/uL   Hemoglobin 13.9  13.0 - 17.0 g/dL   HCT 39.8  39.0 - 52.0 %   MCV 83.6  78.0 - 100.0 fL   MCH 29.2  26.0 - 34.0 pg   MCHC 34.9  30.0 - 36.0 g/dL   RDW 12.6  11.5 - 15.5 %   Platelets 157  150 - 400 K/uL  BASIC  METABOLIC PANEL     Status: None   Collection Time    11/03/13 11:48 AM      Result Value Range   Sodium 137  137 - 147 mEq/L   Potassium 4.0  3.7 - 5.3 mEq/L   Chloride 99  96 - 112 mEq/L   CO2 25  19 - 32 mEq/L   Glucose, Bld 94  70 - 99 mg/dL   BUN 16  6 - 23 mg/dL   Creatinine, Ser 0.86  0.50 - 1.35 mg/dL   Calcium 10.0  8.4 - 10.5 mg/dL   GFR calc non Af Amer >90  >90 mL/min   GFR calc Af Amer >90  >90 mL/min   Comment: (NOTE)     The eGFR has been calculated using the CKD EPI equation.     This calculation has not been validated in all clinical situations.     eGFR's persistently <90 mL/min signify possible Chronic Kidney     Disease.  POCT I-STAT TROPONIN I     Status: Abnormal   Collection Time    11/03/13 12:17 PM      Result Value Range   Troponin i, poc 0.35 (*) 0.00 - 0.08 ng/mL   Comment NOTIFIED PHYSICIAN     Comment 3            Comment: Due to the release kinetics of cTnI,     a negative result within the first hours     of the onset of symptoms does not rule out     myocardial infarction with certainty.     If myocardial infarction is still suspected,     repeat the test at appropriate intervals.  TROPONIN I     Status: Abnormal   Collection Time    11/03/13 12:46 PM      Result Value Range   Troponin I 0.55 (*) <0.30 ng/mL   Comment:            Due to the release kinetics of cTnI,     a negative result within the first hours     of the onset of symptoms does not rule out     myocardial infarction with certainty.     If myocardial infarction is still suspected,     repeat the test at  appropriate intervals.     CRITICAL VALUE NOTED.  VALUE IS CONSISTENT WITH PREVIOUSLY REPORTED AND CALLED VALUE.   Dg Chest 2 View  11/03/2013   CLINICAL DATA:  Chest pain for 2 weeks, history hypertension, COPD, coronary artery disease post MI, smoking  EXAM: CHEST  2 VIEW  COMPARISON:  08/24/2012  FINDINGS: Normal heart size, mediastinal contours, and pulmonary  vascularity.  Lungs clear.  No pleural effusion or pneumothorax.  Bones unremarkable.  IMPRESSION: No acute abnormalities.   Electronically Signed   By: Lavonia Dana M.D.   On: 11/03/2013 12:45    ROS: General:no colds or fevers, + weight loss he is down from 241 in Oct to 230 now. Skin:no rashes or ulcers HEENT:no blurred vision, no congestion CV:see HPI PUL:see HPI GI:no diarrhea constipation or melena, no indigestion GU:no hematuria, no dysuria MS:no joint pain, no claudication Neuro:no syncope, no lightheadedness Endo:no diabetes, no thyroid disease   Blood pressure 144/76, pulse 112, temperature 99.2 F (37.3 C), temperature source Oral, resp. rate 33, weight 229 lb 1.6 oz (103.919 kg), SpO2 96.00%. PE: General:Pleasant affect, NAD Skin:Warm and dry, brisk capillary refill HEENT:normocephalic, sclera clear, mucus membranes moist Neck:supple, no JVD, no bruits  Heart:S1S2 tachy regular without murmur, prominent gallop, no rub or click, no chest wall tenderness Lungs:clear without rales, rhonchi, or wheezes QZR:AQTM, non tender, + BS, do not palpate liver spleen or masses Ext:no lower ext edema, 2+ pedal pulses, 2+ radial pulses Neuro:alert and oriented X 3, MAE, follows commands, + facial symmetry    Assessment/Plan Principal Problem:   Unstable angina Active Problems:   Elevated troponin I level   CAD (coronary artery disease), non obstructive with cath 2013   HYPERTENSION   Hx of Graves' disease   PLAN: Troponin is elevated now 0.55.  No further pain.  Will check echo.    Thyroid panel.  Admit, may need repeat cath.     [place] Nurse Practitioner Certified [place] Pager [phone] or after 5pm or weekends call [phone] 11/03/2013, 2:23 PM  Patient seen and examined with Cecilie Kicks, NP-C. We discussed all aspects of the encounter. I agree with the assessment and plan as stated above.   53 y/o male as above with hyperthyroidism, mild  CAD per cath in 2013. Now presents with CP with mildly positive troponin.  Pain seems pericardial in nature. On exam is quite tachycardic with prominent gallop. I am concerned about myopericarditis versus tachy-induced CM from hyperthyroidism. Will need cath today to exclude underlying CAD and evaluate EF. Would check echo, repeat TFTs and check d-Dimer. Start heparin and ASA pending cath. Will give one dose b-blocker carefully now.   Loxley Cibrian,MD 3:00 PM

## 2013-11-03 NOTE — Assessment & Plan Note (Signed)
Continue with current prescription therapy as reflected on the Med list.  

## 2013-11-03 NOTE — ED Notes (Signed)
Patient transported to X-ray 

## 2013-11-03 NOTE — ED Notes (Signed)
istat troponin shown to Eliberto Ivory, RN

## 2013-11-03 NOTE — Progress Notes (Signed)
  Echocardiogram 2D Echocardiogram has been performed.  Alejandro Reyes 11/03/2013, 3:33 PM

## 2013-11-03 NOTE — ED Notes (Addendum)
Pt reports onset severe midsternal cp this am. hes had a heart problems before so he went to his family doctor and they did an ekg and told him to come to ed for possible admission. States nothing increases or decreases the pain. He did not take any of his daily meds this am. He is A&Ox4, breathing easily

## 2013-11-03 NOTE — Assessment & Plan Note (Addendum)
1/15 angina pains x 10 days  He worked 3d shift earlier He is refusing to go to ER due to $150 co-pay. Risks including the risk of death were discussed. No ASA x 1 wk He did not take his med this am  Rx: Imdur, NTG prn. Re-start other meds. He took Cialis last 2 wks ago. D/c Cialis. Stop smoking.  He was given ASA  D/w Dr Stanford Breed. I insisted pt goes to [facility] ER now. He refused ambulance. No CP at present.Marland KitchenMarland Kitchen

## 2013-11-03 NOTE — ED Provider Notes (Signed)
CSN: [id number]     Arrival date & time 11/03/13  1138 History   First MD Initiated Contact with Patient 11/03/13 1149     Chief Complaint  Patient presents with  . Chest Pain   (Consider location/radiation/quality/duration/timing/severity/associated sxs/prior Treatment) The history is provided by the patient.   53 year old male known history of coronary artery disease. Status post non-STEMI November 2013 did have a cath at that time. Patient presents with intermittent chest pain for the past 2 weeks. Pain lasts 30-45 minutes when present it is 10 out of 10 currently no pain. The pain is left-sided chest radiates to the back associated with some mild shortness of breath no nausea no vomiting. Patient did take a baby aspirin today did not take any nitroglycerin. Patient was at the dentist office yesterday that chest pain that has had a nitroglycerin gave it to him and the chest pain resolved. No worsening of the pain today or yesterday. Pattern has been fairly consistent over the past 2 weeks. Again no pain now.  Past Medical History  Diagnosis Date  . Hypertension   . Chronic systolic CHF (congestive heart failure), NYHA class 1   . Tobacco abuse   . COPD (chronic obstructive pulmonary disease)   . GERD (gastroesophageal reflux disease)   . Alcohol abuse, in remission   . Hyperthyroidism   . CAD (coronary artery disease)   . Pericarditis   . Pneumonia    Past Surgical History  Procedure Laterality Date  . Tonsillectomy    . Esophagogastroduodenoscopy  08/26/2012    Procedure: ESOPHAGOGASTRODUODENOSCOPY (EGD);  Surgeon: Inda Castle, MD;  Location: [facility];  Service: Endoscopy;  Laterality: N/A;  . Appendectomy  1976   Family History  Problem Relation Age of Onset  . Hypertension Father   . Cancer - Other Father     SCCa of Oropharynx  . Kidney disease Father   . Alcohol abuse Father   . Kidney disease Brother   . Heart disease Maternal Aunt   . Heart disease Paternal  Aunt   . Heart disease Maternal Grandfather   . Cancer Neg Hx   . Diabetes Neg Hx   . Early death Neg Hx   . Hyperlipidemia Neg Hx   . Stroke Neg Hx    History  Substance Use Topics  . Smoking status: Current Every Day Smoker    Types: Cigarettes  . Smokeless tobacco: Never Used     Comment: pt has stopped smoking for a few days, uses nicorette gum  . Alcohol Use: No    Review of Systems  Constitutional: Negative for fever.  HENT: Negative for congestion.   Eyes: Negative for redness.  Respiratory: Positive for chest tightness and shortness of breath.   Cardiovascular: Positive for chest pain. Negative for leg swelling.  Gastrointestinal: Negative for nausea, vomiting and abdominal pain.  Genitourinary: Negative for dysuria.  Musculoskeletal: Positive for back pain.  Skin: Negative for rash.  Neurological: Negative for headaches.  Hematological: Does not bruise/bleed easily.  Psychiatric/Behavioral: Negative for confusion.    Allergies  Review of patient's allergies indicates no known allergies.  Home Medications   Current Outpatient Rx  Name  Route  Sig  Dispense  Refill  . aspirin 325 MG tablet   Oral   Take 325 mg by mouth daily.         Marland Kitchen atorvastatin (LIPITOR) 40 MG tablet   Oral   Take 40 mg by mouth daily.         Marland Kitchen  carvedilol (COREG) 6.25 MG tablet   Oral   Take 6.25 mg by mouth 2 (two) times daily with a meal.         . ibuprofen (ADVIL,MOTRIN) 800 MG tablet   Oral   Take 800 mg by mouth daily as needed for fever, headache or moderate pain.         . isosorbide mononitrate (IMDUR) 30 MG 24 hr tablet   Oral   Take 30 mg by mouth daily.         Marland Kitchen losartan-hydrochlorothiazide (HYZAAR) 100-25 MG per tablet   Oral   Take 1 tablet by mouth daily.         . pantoprazole (PROTONIX) 40 MG tablet   Oral   Take 1 tablet (40 mg total) by mouth 2 (two) times daily before a meal.   60 tablet   6   . potassium chloride SA (K-DUR,KLOR-CON) 20  MEQ tablet   Oral   Take 2 tablets (40 mEq total) by mouth daily.   60 tablet   6   . nitroGLYCERIN (NITROSTAT) 0.4 MG SL tablet   Sublingual   Place 0.4 mg under the tongue every 5 (five) minutes as needed for chest pain.          BP 144/76  Pulse 112  Temp(Src) 99.2 F (37.3 C) (Oral)  Resp 33  Wt 229 lb 1.6 oz (103.919 kg)  SpO2 96% Physical Exam  Nursing note and vitals reviewed. Constitutional: He is oriented to person, place, and time. He appears well-developed and well-nourished. No distress.  HENT:  Head: Normocephalic and atraumatic.  Mouth/Throat: Oropharynx is clear and moist.  Eyes: Conjunctivae and EOM are normal. Pupils are equal, round, and reactive to light.  Neck: Normal range of motion.  Cardiovascular: Regular rhythm and normal heart sounds.   No murmur heard. Tachycardia.  Pulmonary/Chest: Effort normal and breath sounds normal. No respiratory distress.  Abdominal: Soft. Bowel sounds are normal. There is no tenderness.  Musculoskeletal: Normal range of motion. He exhibits no edema.  Neurological: He is alert and oriented to person, place, and time. No cranial nerve deficit. He exhibits normal muscle tone. Coordination normal.  Skin: Skin is warm. No rash noted.    ED Course  Procedures (including critical care time) Labs Review Labs Reviewed  TROPONIN I - Abnormal; Notable for the following:    Troponin I 0.55 (*)    All other components within normal limits  POCT I-STAT TROPONIN I - Abnormal; Notable for the following:    Troponin i, poc 0.35 (*)    All other components within normal limits  CBC  BASIC METABOLIC PANEL  HEPARIN LEVEL (UNFRACTIONATED)   Results for orders placed during the hospital encounter of 11/03/13  CBC      Result Value Range   WBC 5.5  4.0 - 10.5 K/uL   RBC 4.76  4.22 - 5.81 MIL/uL   Hemoglobin 13.9  13.0 - 17.0 g/dL   HCT 39.8  39.0 - 52.0 %   MCV 83.6  78.0 - 100.0 fL   MCH 29.2  26.0 - 34.0 pg   MCHC 34.9  30.0  - 36.0 g/dL   RDW 12.6  11.5 - 15.5 %   Platelets 157  150 - 400 K/uL  BASIC METABOLIC PANEL      Result Value Range   Sodium 137  137 - 147 mEq/L   Potassium 4.0  3.7 - 5.3 mEq/L   Chloride 99  96 -  112 mEq/L   CO2 25  19 - 32 mEq/L   Glucose, Bld 94  70 - 99 mg/dL   BUN 16  6 - 23 mg/dL   Creatinine, Ser 0.86  0.50 - 1.35 mg/dL   Calcium 10.0  8.4 - 10.5 mg/dL   GFR calc non Af Amer >90  >90 mL/min   GFR calc Af Amer >90  >90 mL/min  TROPONIN I      Result Value Range   Troponin I 0.55 (*) <0.30 ng/mL  POCT I-STAT TROPONIN I      Result Value Range   Troponin i, poc 0.35 (*) 0.00 - 0.08 ng/mL   Comment NOTIFIED PHYSICIAN     Comment 3            Results for orders placed during the hospital encounter of 11/03/13  CBC      Result Value Range   WBC 5.5  4.0 - 10.5 K/uL   RBC 4.76  4.22 - 5.81 MIL/uL   Hemoglobin 13.9  13.0 - 17.0 g/dL   HCT 39.8  39.0 - 52.0 %   MCV 83.6  78.0 - 100.0 fL   MCH 29.2  26.0 - 34.0 pg   MCHC 34.9  30.0 - 36.0 g/dL   RDW 12.6  11.5 - 15.5 %   Platelets 157  150 - 400 K/uL  BASIC METABOLIC PANEL      Result Value Range   Sodium 137  137 - 147 mEq/L   Potassium 4.0  3.7 - 5.3 mEq/L   Chloride 99  96 - 112 mEq/L   CO2 25  19 - 32 mEq/L   Glucose, Bld 94  70 - 99 mg/dL   BUN 16  6 - 23 mg/dL   Creatinine, Ser 0.86  0.50 - 1.35 mg/dL   Calcium 10.0  8.4 - 10.5 mg/dL   GFR calc non Af Amer >90  >90 mL/min   GFR calc Af Amer >90  >90 mL/min  TROPONIN I      Result Value Range   Troponin I 0.55 (*) <0.30 ng/mL  POCT I-STAT TROPONIN I      Result Value Range   Troponin i, poc 0.35 (*) 0.00 - 0.08 ng/mL   Comment NOTIFIED PHYSICIAN     Comment 3              Imaging Review Dg Chest 2 View  11/03/2013   CLINICAL DATA:  Chest pain for 2 weeks, history hypertension, COPD, coronary artery disease post MI, smoking  EXAM: CHEST  2 VIEW  COMPARISON:  08/24/2012  FINDINGS: Normal heart size, mediastinal contours, and pulmonary vascularity.   Lungs clear.  No pleural effusion or pneumothorax.  Bones unremarkable.  IMPRESSION: No acute abnormalities.   Electronically Signed   By: Lavonia Dana M.D.   On: 11/03/2013 12:45    EKG Interpretation    Date/Time:  Friday November 03 2013 11:39:58 EST Ventricular Rate:  126 PR Interval:  170 QRS Duration: 104 QT Interval:  300 QTC Calculation: 434 R Axis:   9 Text Interpretation:  Sinus tachycardia Minimal voltage criteria for LVH, may be normal variant ST depression, consider subendocardial injury or digitalis effect Abnormal ECG Changes in ST depression Confirmed by Margues Filippini  MD, Inanna Telford ([id number]) on 11/03/2013 12:52:46 PM           CRITICAL CARE Performed by: Mervin Kung. Total critical care time: 30 Critical care time was exclusive of separately billable procedures and  treating other patients. Critical care was necessary to treat or prevent imminent or life-threatening deterioration. Critical care was time spent personally by me on the following activities: development of treatment plan with patient and/or surrogate as well as nursing, discussions with consultants, evaluation of patient's response to treatment, examination of patient, obtaining history from patient or surrogate, ordering and performing treatments and interventions, ordering and review of laboratory studies, ordering and review of radiographic studies, pulse oximetry and re-evaluation of patient's condition.    MDM   1. Chest pain   2. Non-STEMI (non-ST elevated myocardial infarction)     The patient with history of chest pain on and off for 2 weeks. Patient with elevated point-of-care troponin. EKG without evidence of a ST segment elevation MI. Patient's symptoms either consistent with unstable angina or non-STEMI. Patient was pain free when seen, had 325 mg of aspirin patient started on heparin drip following a bolus. Followed by [facility]ology in the past they were contacted to see the patient have ranged  admission.  The patient did have sublingual nitroglycerin ordered however he was already pain free. Patient's chest pain has lasted for 30-40 minutes on and off over the past 2 weeks.  Mervin Kung, MD 11/03/13 [phone]

## 2013-11-04 DIAGNOSIS — R079 Chest pain, unspecified: Secondary | ICD-10-CM

## 2013-11-04 LAB — CBC
HCT: 37.1 % — ABNORMAL LOW (ref 39.0–52.0)
HEMOGLOBIN: 12.4 g/dL — AB (ref 13.0–17.0)
MCH: 28.8 pg (ref 26.0–34.0)
MCHC: 33.4 g/dL (ref 30.0–36.0)
MCV: 86.1 fL (ref 78.0–100.0)
PLATELETS: 135 10*3/uL — AB (ref 150–400)
RBC: 4.31 MIL/uL (ref 4.22–5.81)
RDW: 12.7 % (ref 11.5–15.5)
WBC: 4.2 10*3/uL (ref 4.0–10.5)

## 2013-11-04 LAB — BASIC METABOLIC PANEL
BUN: 18 mg/dL (ref 6–23)
CHLORIDE: 103 meq/L (ref 96–112)
CO2: 25 mEq/L (ref 19–32)
Calcium: 10 mg/dL (ref 8.4–10.5)
Creatinine, Ser: 0.93 mg/dL (ref 0.50–1.35)
GFR calc Af Amer: >90 mL/min (ref >90)
GFR calc non Af Amer: >90 mL/min (ref >90)
Glucose, Bld: 95 mg/dL (ref 70–99)
POTASSIUM: 4.2 meq/L (ref 3.7–5.3)
Sodium: 141 mEq/L (ref 137–147)

## 2013-11-04 LAB — LIPID PANEL
CHOLESTEROL: 95 mg/dL (ref 0–200)
HDL: 28 mg/dL — ABNORMAL LOW (ref >39)
LDL Cholesterol: 53 mg/dL (ref 0–99)
Total CHOL/HDL Ratio: 3.4 RATIO
Triglycerides: 70 mg/dL (ref <150)
VLDL: 14 mg/dL (ref 0–40)

## 2013-11-04 LAB — TROPONIN I
TROPONIN I: 0.63 ng/mL — AB (ref <0.30)
Troponin I: 0.4 ng/mL (ref <0.30)

## 2013-11-04 MED ORDER — METOPROLOL TARTRATE 50 MG PO TABS: 50.0000 mg | ORAL_TABLET | Freq: Two times a day (BID) | ORAL | Status: DC

## 2013-11-04 NOTE — Progress Notes (Signed)
SUBJECTIVE:  Chest pain this AM.  Not as severe and it improved with SLNTG.  No acute EKG changes.     PHYSICAL EXAM Filed Vitals:   11/03/13 2100 11/03/13 2243 11/04/13 0113 11/04/13 0602  BP: 137/58 114/59  127/77  Pulse: 75 109  100  Temp:    98.2 F (36.8 C)  TempSrc:    Oral  Resp: 20   20  Height:      Weight:   227 lb 4.8 oz (103.103 kg)   SpO2: 96%   93%   General:  No distress Lungs:  Clear Heart:  RRR Abdomen:  Positive bowel sounds, no rebound no guarding Extremities:  No edema  LABS: Lab Results  Component Value Date   TROPONINI 0.40* 11/04/2013   Results for orders placed during the hospital encounter of 11/03/13 (from the past 24 hour(s))  CBC     Status: None   Collection Time    11/03/13 11:48 AM      Result Value Range   WBC 5.5  4.0 - 10.5 K/uL   RBC 4.76  4.22 - 5.81 MIL/uL   Hemoglobin 13.9  13.0 - 17.0 g/dL   HCT 39.8  39.0 - 52.0 %   MCV 83.6  78.0 - 100.0 fL   MCH 29.2  26.0 - 34.0 pg   MCHC 34.9  30.0 - 36.0 g/dL   RDW 12.6  11.5 - 15.5 %   Platelets 157  150 - 400 K/uL  BASIC METABOLIC PANEL     Status: None   Collection Time    11/03/13 11:48 AM      Result Value Range   Sodium 137  137 - 147 mEq/L   Potassium 4.0  3.7 - 5.3 mEq/L   Chloride 99  96 - 112 mEq/L   CO2 25  19 - 32 mEq/L   Glucose, Bld 94  70 - 99 mg/dL   BUN 16  6 - 23 mg/dL   Creatinine, Ser 0.86  0.50 - 1.35 mg/dL   Calcium 10.0  8.4 - 10.5 mg/dL   GFR calc non Af Amer >90  >90 mL/min   GFR calc Af Amer >90  >90 mL/min  POCT I-STAT TROPONIN I     Status: Abnormal   Collection Time    11/03/13 12:17 PM      Result Value Range   Troponin i, poc 0.35 (*) 0.00 - 0.08 ng/mL   Comment NOTIFIED PHYSICIAN     Comment 3           TROPONIN I     Status: Abnormal   Collection Time    11/03/13 12:46 PM      Result Value Range   Troponin I 0.55 (*) <0.30 ng/mL  D-DIMER, QUANTITATIVE     Status: None   Collection Time    11/03/13  3:36 PM      Result Value Range   D-Dimer, Quant <0.27  0.00 - 0.48 ug/mL-FEU  PROTIME-INR     Status: None   Collection Time    11/03/13  3:36 PM      Result Value Range   Prothrombin Time 13.7  11.6 - 15.2 seconds   INR 1.07  0.00 - 1.49  APTT     Status: Abnormal   Collection Time    11/03/13  3:36 PM      Result Value Range   aPTT 49 (*) 24 - 37 seconds  TSH  Status: Abnormal   Collection Time    11/03/13  3:36 PM      Result Value Range   TSH <0.008 (*) 0.350 - 4.500 uIU/mL  T4, FREE     Status: Abnormal   Collection Time    11/03/13  3:36 PM      Result Value Range   Free T4 6.02 (*) 0.80 - 1.80 ng/dL  MAGNESIUM     Status: None   Collection Time    11/03/13  3:36 PM      Result Value Range   Magnesium 1.7  1.5 - 2.5 mg/dL  PRO B NATRIURETIC PEPTIDE     Status: None   Collection Time    11/03/13  3:36 PM      Result Value Range   Pro B Natriuretic peptide (BNP) 89.1  0 - 125 pg/mL  HEMOGLOBIN A1C     Status: Abnormal   Collection Time    11/03/13  3:36 PM      Result Value Range   Hemoglobin A1C 5.9 (*) <5.7 %   Mean Plasma Glucose 123 (*) <117 mg/dL  TROPONIN I     Status: Abnormal   Collection Time    11/03/13  7:58 PM      Result Value Range   Troponin I 0.45 (*) <0.30 ng/mL  CBC     Status: Abnormal   Collection Time    11/03/13  7:58 PM      Result Value Range   WBC 5.4  4.0 - 10.5 K/uL   RBC 4.37  4.22 - 5.81 MIL/uL   Hemoglobin 12.5 (*) 13.0 - 17.0 g/dL   HCT 37.6 (*) 39.0 - 52.0 %   MCV 86.0  78.0 - 100.0 fL   MCH 28.6  26.0 - 34.0 pg   MCHC 33.2  30.0 - 36.0 g/dL   RDW 12.8  11.5 - 15.5 %   Platelets 133 (*) 150 - 400 K/uL  CREATININE, SERUM     Status: None   Collection Time    11/03/13  7:58 PM      Result Value Range   Creatinine, Ser 0.79  0.50 - 1.35 mg/dL   GFR calc non Af Amer >90  >90 mL/min   GFR calc Af Amer >90  >90 mL/min  TROPONIN I     Status: Abnormal   Collection Time    11/04/13  1:35 AM      Result Value Range   Troponin I 0.63 (*) <0.30 ng/mL    CBC     Status: Abnormal   Collection Time    11/04/13  4:50 AM      Result Value Range   WBC 4.2  4.0 - 10.5 K/uL   RBC 4.31  4.22 - 5.81 MIL/uL   Hemoglobin 12.4 (*) 13.0 - 17.0 g/dL   HCT 37.1 (*) 39.0 - 52.0 %   MCV 86.1  78.0 - 100.0 fL   MCH 28.8  26.0 - 34.0 pg   MCHC 33.4  30.0 - 36.0 g/dL   RDW 12.7  11.5 - 15.5 %   Platelets 135 (*) 150 - 400 K/uL  LIPID PANEL     Status: Abnormal   Collection Time    11/04/13  4:50 AM      Result Value Range   Cholesterol 95  0 - 200 mg/dL   Triglycerides 70  <150 mg/dL   HDL 28 (*) >39 mg/dL   Total CHOL/HDL Ratio 3.4  VLDL 14  0 - 40 mg/dL   LDL Cholesterol 53  0 - 99 mg/dL  BASIC METABOLIC PANEL     Status: None   Collection Time    11/04/13  4:50 AM      Result Value Range   Sodium 141  137 - 147 mEq/L   Potassium 4.2  3.7 - 5.3 mEq/L   Chloride 103  96 - 112 mEq/L   CO2 25  19 - 32 mEq/L   Glucose, Bld 95  70 - 99 mg/dL   BUN 18  6 - 23 mg/dL   Creatinine, Ser 0.93  0.50 - 1.35 mg/dL   Calcium 10.0  8.4 - 10.5 mg/dL   GFR calc non Af Amer >90  >90 mL/min   GFR calc Af Amer >90  >90 mL/min  TROPONIN I     Status: Abnormal   Collection Time    11/04/13  7:45 AM      Result Value Range   Troponin I 0.40 (*) <0.30 ng/mL    Intake/Output Summary (Last 24 hours) at 11/04/13 1045 Last data filed at 11/03/13 1636  Gross per 24 hour  Intake    600 ml  Output    300 ml  Net    300 ml   EKG:    Sinus tachycardia with rate 103.  LVH with repol.  No acute ST T wave changes.   11/04/2013  ASSESSMENT AND PLAN:  CHEST PAIN/ELEVATED TROPONIN:  Mild nonobstructive CAD.   Echo without evidence of pericarditis or effusion.  EF OK.  No obstructive CAD on cath.  Etiology not clear.  No further cardiac work up however.  D dimer was also negative.  Low pretest probability of PE.  No further imagine.   UDS was not sent.   Send home with Imdur 30 mg daily and SLNTG.  Despite the elevated enzyme I still am concerned about a possible GI  source.  I will send this note to Dr. Ronnald Ramp and suggest possible GI follow up.  Could be a smoldering myocarditis but it is atypical for this.     HYPERTHYROIDISM:  TSH markedly depressed but T4 OK.  Continue follow up with Dr. Dwyane Dee.     [facility] 11/04/2013 10:45 AM

## 2013-11-04 NOTE — Discharge Summary (Signed)
Physician Discharge Summary  Patient ID: Alejandro Reyes MRN: [id number] DOB/AGE: 1961/03/15 53 y.o.  Admit date: 11/03/2013 Discharge date: 11/04/2013 PCP: Scarlette Calico, MD Cardiologist:  Admission Diagnoses:  Unstable Angina  Discharge Diagnoses:  Principal Problem:   Unstable angina Active Problems:   HYPERTENSION   Elevated troponin I level   CAD (coronary artery disease), non obstructive with cath 2013   Hx of Graves' disease   Discharged Condition: stable  Hospital Course:  53 year old AAM with hx mild nonobstructive CAD, HTN, GERD, COPD, tobacco abuse and past possible myopericarditis or stress induced CM back in November 2013. Cath at that time (08/24/2012) showed mild disease in the left main, LAD and RCA but no flow limiting lesions. EF was 45%. EF 65% prior to his discharge at that time by echo.. Was also started on Tapazole at that time for hyperthyroidism. He had been doing well until the last 2 weeks. He has had mid sternal chest pain, severe, much like the pain in 2013 and associated with occ. SOB and diaphoresis. No nausea or vomiting, no fevers. Pain is worse with lying back. Better when he is working. He has been taking nexium in addition to protonix without relief.   Yesterday he was at his dentist and had significant chest pain and given 1 NTG sl with complete relief. Pain eventually returned by 5 pm. 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 Today he went to his PCP office secondary to chest pain and they asked him to come to the ER.   Currently chest pain free but + troponin and EKG with Inf/Lat ST depression compared to 2013. Also ST at 126.  His hyperthyroidism was treated sporadically until he saw Dr. Dwyane Dee. He was to stay off the tapazole and be scheduled for I-131 treatment but his labs were normal and no further treatment was needed from the record. Pt tells me he has been taking the Tapazole. He tells me he has been  loosing wt as well. Last thyroid panel Nov. 2014 TSH 0.02, Free T4 2.01, T3 306.8 At that time pt was to call back for treatment- he tells me they asked to resume tapazole.   The patient was admitted and taken for LHC the same day.  This revealed mild to moderate non-obstructive CAD and normal LVF.  Lopressor was started for tachycardia.  TSH was low with an elevated Free T4(6.02).  He will follow up with Dr. Dwyane Dee.   Negative D-Dimer.  Imdur continued.  The patient was seen by Dr. Percival Spanish who felt he was stable for DC home.   Consults: None  Significant Diagnostic Studies:   Left heart cath Description of procedure:   The risks and indication of the procedure were explained. Consent was signed and placed on the chart. An appropriate timeout was taken prior to the procedure. After a normal Allen's test was confirmed, the right wrist was prepped and draped in the routine sterile fashion and anesthetized with 1% local lidocaine.  A 5 FR arterial sheath was then placed in the right radial artery using a modified Seldinger technique. Systemic heparin was administered. 3mg  IV verapamil was given through the sheath. Standard catheters including a JL 3.5, JR4 and straight pigtail were used. All catheter exchanges were made over a wire.  Complications: None apparent  Findings:  Ao Pressure: 109/73 (90)  LV Pressure: 117/4/15  There was no signficant gradient across the aortic valve on pullback.  Left main:  20% distal stenosis.  Left Anterior Descending Artery: Large caliber vessel that courses to the apex. The ostium appears to have 20-30% stenosis. The mid vessel has diffuse 20-30% stenosis. There are two small to moderate caliber diagonal branches without focal stenosis.  Circumflex Artery: Large caliber vessel that terminates into a moderate-sized branching obtuse marginal There is mild plaque in the proximal vessel and in the marginal branch. There is a small ramus branch.  Right Coronary Artery:  Moderate caliber, dominant vessel with diffuse 40-50% mid stenosis.  Left Ventricular Angiogram: LVEF 65-70%. Nu wall motion abnormalities  Assessment:  1. Mild to moderate non-obstructive CAD  2. Normal LV function  Plan/Discussion:  Suspect CP may pericarditic in nature. Tachycardia may be related to hyperthyroidism (TSH pending). Continue b-blocker. Check UDS, d-dimer and echo.  Daniel Bensimhon  5:29 PM  CHEST 2 VIEW  COMPARISON: 08/24/2012  FINDINGS: Normal heart size, mediastinal contours, and pulmonary vascularity. Lungs clear. No pleural effusion or pneumothorax. Bones unremarkable.  IMPRESSION: No acute abnormalities.  Treatments: See above.  Discharge Exam: Blood pressure 127/77, pulse 100, temperature 98.2 F (36.8 C), temperature source Oral, resp. rate 20, height 6\' 1"  (1.854 m), weight 227 lb 4.8 oz (103.103 kg), SpO2 93.00%.   Disposition: 01-Home or Self Care      Discharge Orders   Future Orders Complete By Expires   Diet - low sodium heart healthy  As directed    Discharge instructions  As directed    Comments:     No lifting with your right arm for two days.   Increase activity slowly  As directed        Medication List    STOP taking these medications       carvedilol 6.25 MG tablet  Commonly known as:  COREG      TAKE these medications       aspirin 325 MG tablet  Take 325 mg by mouth daily.     atorvastatin 40 MG tablet  Commonly known as:  LIPITOR  Take 40 mg by mouth daily.     ibuprofen 800 MG tablet  Commonly known as:  ADVIL,MOTRIN  Take 800 mg by mouth daily as needed for fever, headache or moderate pain.     isosorbide mononitrate 30 MG 24 hr tablet  Commonly known as:  IMDUR  Take 30 mg by mouth daily.     losartan-hydrochlorothiazide 100-25 MG per tablet  Commonly known as:  HYZAAR  Take 1 tablet by mouth daily.     metoprolol 50 MG tablet  Commonly known as:  LOPRESSOR  Take 1 tablet (50 mg total) by mouth 2  (two) times daily.     nitroGLYCERIN 0.4 MG SL tablet  Commonly known as:  NITROSTAT  Place 0.4 mg under the tongue every 5 (five) minutes as needed for chest pain.     pantoprazole 40 MG tablet  Commonly known as:  PROTONIX  Take 1 tablet (40 mg total) by mouth 2 (two) times daily before a meal.     potassium chloride SA 20 MEQ tablet  Commonly known as:  K-DUR,KLOR-CON  Take 2 tablets (40 mEq total) by mouth daily.       Follow-up Information   Follow up with Lauree Chandler, MD. (The office will call you withthe appt date and time.)    Specialty:  Cardiology   Contact information:   [place] [place] [address] [phone]       Follow up with  Elayne Snare, MD. (Call for follow up appt.)    Specialty:  Endocrinology   Contact information:   [place] [address] [phone]       Signed: Tarri Fuller 11/04/2013, 12:17 PM

## 2013-11-07 LAB — T3

## 2013-11-07 NOTE — Progress Notes (Signed)
Utilization review completed.  

## 2013-11-09 ENCOUNTER — Emergency Department (HOSPITAL_COMMUNITY): Payer: 59

## 2013-11-09 ENCOUNTER — Encounter (HOSPITAL_COMMUNITY): Payer: Self-pay | Admitting: Emergency Medicine

## 2013-11-09 ENCOUNTER — Inpatient Hospital Stay (HOSPITAL_COMMUNITY)
Admission: EM | Admit: 2013-11-09 | Discharge: 2013-11-16 | DRG: 988 | Disposition: A | Discharge status: Home / Self Care | Payer: 59 | Attending: Surgery | Admitting: Surgery

## 2013-11-09 DIAGNOSIS — Z8249 Family history of ischemic heart disease and other diseases of the circulatory system: Secondary | ICD-10-CM

## 2013-11-09 DIAGNOSIS — Z7982 Long term (current) use of aspirin: Secondary | ICD-10-CM

## 2013-11-09 DIAGNOSIS — Z8639 Personal history of other endocrine, nutritional and metabolic disease: Secondary | ICD-10-CM

## 2013-11-09 DIAGNOSIS — I10 Essential (primary) hypertension: Secondary | ICD-10-CM | POA: Diagnosis present

## 2013-11-09 DIAGNOSIS — I2510 Atherosclerotic heart disease of native coronary artery without angina pectoris: Secondary | ICD-10-CM

## 2013-11-09 DIAGNOSIS — R079 Chest pain, unspecified: Secondary | ICD-10-CM

## 2013-11-09 DIAGNOSIS — I498 Other specified cardiac arrhythmias: Secondary | ICD-10-CM | POA: Diagnosis present

## 2013-11-09 DIAGNOSIS — J9819 Other pulmonary collapse: Secondary | ICD-10-CM | POA: Diagnosis not present

## 2013-11-09 DIAGNOSIS — J449 Chronic obstructive pulmonary disease, unspecified: Secondary | ICD-10-CM | POA: Diagnosis present

## 2013-11-09 DIAGNOSIS — J4489 Other specified chronic obstructive pulmonary disease: Secondary | ICD-10-CM | POA: Diagnosis present

## 2013-11-09 DIAGNOSIS — R222 Localized swelling, mass and lump, trunk: Secondary | ICD-10-CM

## 2013-11-09 DIAGNOSIS — I509 Heart failure, unspecified: Secondary | ICD-10-CM | POA: Diagnosis present

## 2013-11-09 DIAGNOSIS — F1011 Alcohol abuse, in remission: Secondary | ICD-10-CM | POA: Diagnosis present

## 2013-11-09 DIAGNOSIS — Z79899 Other long term (current) drug therapy: Secondary | ICD-10-CM

## 2013-11-09 DIAGNOSIS — J9859 Other diseases of mediastinum, not elsewhere classified: Secondary | ICD-10-CM | POA: Diagnosis present

## 2013-11-09 DIAGNOSIS — E320 Persistent hyperplasia of thymus: Principal | ICD-10-CM | POA: Diagnosis present

## 2013-11-09 DIAGNOSIS — F17200 Nicotine dependence, unspecified, uncomplicated: Secondary | ICD-10-CM | POA: Diagnosis present

## 2013-11-09 DIAGNOSIS — D62 Acute posthemorrhagic anemia: Secondary | ICD-10-CM | POA: Diagnosis not present

## 2013-11-09 DIAGNOSIS — I214 Non-ST elevation (NSTEMI) myocardial infarction: Secondary | ICD-10-CM | POA: Diagnosis present

## 2013-11-09 DIAGNOSIS — R0789 Other chest pain: Secondary | ICD-10-CM

## 2013-11-09 DIAGNOSIS — K219 Gastro-esophageal reflux disease without esophagitis: Secondary | ICD-10-CM | POA: Diagnosis present

## 2013-11-09 DIAGNOSIS — I5022 Chronic systolic (congestive) heart failure: Secondary | ICD-10-CM | POA: Diagnosis present

## 2013-11-09 DIAGNOSIS — E0590 Thyrotoxicosis, unspecified without thyrotoxic crisis or storm: Secondary | ICD-10-CM

## 2013-11-09 DIAGNOSIS — R000 Tachycardia, unspecified: Secondary | ICD-10-CM

## 2013-11-09 LAB — BASIC METABOLIC PANEL
BUN: 26 mg/dL — ABNORMAL HIGH (ref 6–23)
CALCIUM: 10.1 mg/dL (ref 8.4–10.5)
CO2: 25 meq/L (ref 19–32)
Chloride: 99 mEq/L (ref 96–112)
Creatinine, Ser: 1.01 mg/dL (ref 0.50–1.35)
GFR calc Af Amer: >90 mL/min (ref >90)
GFR calc non Af Amer: 82 mL/min — ABNORMAL LOW (ref >90)
GLUCOSE: 106 mg/dL — AB (ref 70–99)
Potassium: 4.4 mEq/L (ref 3.7–5.3)
SODIUM: 138 meq/L (ref 137–147)

## 2013-11-09 LAB — POCT I-STAT TROPONIN I
TROPONIN I, POC: 0.01 ng/mL (ref 0.00–0.08)
Troponin i, poc: 0.02 ng/mL (ref 0.00–0.08)

## 2013-11-09 LAB — CBC
HCT: 39.6 % (ref 39.0–52.0)
Hemoglobin: 13.5 g/dL (ref 13.0–17.0)
MCH: 28.7 pg (ref 26.0–34.0)
MCHC: 34.1 g/dL (ref 30.0–36.0)
MCV: 84.3 fL (ref 78.0–100.0)
Platelets: 145 10*3/uL — ABNORMAL LOW (ref 150–400)
RBC: 4.7 MIL/uL (ref 4.22–5.81)
RDW: 12.4 % (ref 11.5–15.5)
WBC: 5.6 10*3/uL (ref 4.0–10.5)

## 2013-11-09 LAB — TROPONIN I: Troponin I: <0.3 ng/mL (ref <0.30)

## 2013-11-09 LAB — PRO B NATRIURETIC PEPTIDE: Pro B Natriuretic peptide (BNP): 167.9 pg/mL — ABNORMAL HIGH (ref 0–125)

## 2013-11-09 MED ORDER — ALUM & MAG HYDROXIDE-SIMETH 200-200-20 MG/5ML PO SUSP: 30.0000 mL | Freq: Four times a day (QID) | ORAL | Status: DC | PRN

## 2013-11-09 MED ORDER — ONDANSETRON HCL 4 MG/2ML IJ SOLN: 4.0000 mg | Freq: Four times a day (QID) | INTRAMUSCULAR | Status: DC | PRN

## 2013-11-09 MED ORDER — IOHEXOL 350 MG/ML SOLN
100.0000 mL | Freq: Once | INTRAVENOUS | Status: AC | PRN
Start: 2013-11-09 — End: 2013-11-09
  Administered 2013-11-09: 100 mL via INTRAVENOUS

## 2013-11-09 MED ORDER — ACETAMINOPHEN 650 MG RE SUPP: 650.0000 mg | Freq: Four times a day (QID) | RECTAL | Status: DC | PRN

## 2013-11-09 MED ORDER — LOSARTAN POTASSIUM-HCTZ 100-25 MG PO TABS: 1.0000 | ORAL_TABLET | Freq: Every day | ORAL | Status: DC

## 2013-11-09 MED ORDER — METOPROLOL TARTRATE 50 MG PO TABS
50.0000 mg | ORAL_TABLET | Freq: Two times a day (BID) | ORAL | Status: DC
  Administered 2013-11-09 – 2013-11-16 (×13): 50 mg via ORAL
  Filled 2013-11-09 (×15): qty 1

## 2013-11-09 MED ORDER — NAPROXEN 250 MG PO TABS
250.0000 mg | ORAL_TABLET | Freq: Two times a day (BID) | ORAL | Status: AC
  Administered 2013-11-10 – 2013-11-11 (×4): 250 mg via ORAL
  Filled 2013-11-09 (×4): qty 1

## 2013-11-09 MED ORDER — HYDROCHLOROTHIAZIDE 25 MG PO TABS
25.0000 mg | ORAL_TABLET | Freq: Every day | ORAL | Status: DC
  Administered 2013-11-10 – 2013-11-13 (×4): 25 mg via ORAL
  Filled 2013-11-09 (×4): qty 1

## 2013-11-09 MED ORDER — ALBUTEROL SULFATE (2.5 MG/3ML) 0.083% IN NEBU
2.5000 mg | INHALATION_SOLUTION | RESPIRATORY_TRACT | Status: DC | PRN
Start: 2013-11-09 — End: 2013-11-13

## 2013-11-09 MED ORDER — ASPIRIN 325 MG PO TABS
325.0000 mg | ORAL_TABLET | Freq: Every day | ORAL | Status: DC
  Administered 2013-11-09 – 2013-11-16 (×7): 325 mg via ORAL
  Filled 2013-11-09 (×8): qty 1

## 2013-11-09 MED ORDER — NITROGLYCERIN 0.4 MG SL SUBL: 0.4000 mg | SUBLINGUAL_TABLET | SUBLINGUAL | Status: DC | PRN

## 2013-11-09 MED ORDER — HYDROCODONE-ACETAMINOPHEN 5-325 MG PO TABS
1.0000 | ORAL_TABLET | ORAL | Status: DC | PRN
  Administered 2013-11-09 – 2013-11-11 (×2): 2 via ORAL
  Filled 2013-11-09 (×2): qty 2

## 2013-11-09 MED ORDER — ATORVASTATIN CALCIUM 40 MG PO TABS
40.0000 mg | ORAL_TABLET | Freq: Every day | ORAL | Status: DC
  Administered 2013-11-09 – 2013-11-15 (×6): 40 mg via ORAL
  Filled 2013-11-09 (×8): qty 1

## 2013-11-09 MED ORDER — SODIUM CHLORIDE 0.9 % IJ SOLN
3.0000 mL | Freq: Two times a day (BID) | INTRAMUSCULAR | Status: DC
  Administered 2013-11-09 – 2013-11-13 (×5): 3 mL via INTRAVENOUS

## 2013-11-09 MED ORDER — SODIUM CHLORIDE 0.9 % IV SOLN
INTRAVENOUS | Status: DC
  Administered 2013-11-09 – 2013-11-13 (×2): via INTRAVENOUS

## 2013-11-09 MED ORDER — LOSARTAN POTASSIUM 50 MG PO TABS
100.0000 mg | ORAL_TABLET | Freq: Every day | ORAL | Status: DC
  Administered 2013-11-10 – 2013-11-13 (×4): 100 mg via ORAL
  Filled 2013-11-09 (×5): qty 2

## 2013-11-09 MED ORDER — ISOSORBIDE MONONITRATE ER 30 MG PO TB24
30.0000 mg | ORAL_TABLET | Freq: Every day | ORAL | Status: DC
  Administered 2013-11-09 – 2013-11-16 (×8): 30 mg via ORAL
  Filled 2013-11-09 (×8): qty 1

## 2013-11-09 MED ORDER — MORPHINE SULFATE 4 MG/ML IJ SOLN
4.0000 mg | Freq: Once | INTRAMUSCULAR | Status: AC
  Administered 2013-11-09: 4 mg via INTRAVENOUS
  Filled 2013-11-09: qty 1

## 2013-11-09 MED ORDER — PANTOPRAZOLE SODIUM 40 MG PO TBEC
40.0000 mg | DELAYED_RELEASE_TABLET | Freq: Two times a day (BID) | ORAL | Status: DC
  Administered 2013-11-10 – 2013-11-16 (×11): 40 mg via ORAL
  Filled 2013-11-09 (×11): qty 1

## 2013-11-09 MED ORDER — HEPARIN SODIUM (PORCINE) 5000 UNIT/ML IJ SOLN
5000.0000 [IU] | Freq: Three times a day (TID) | INTRAMUSCULAR | Status: DC
  Administered 2013-11-09 – 2013-11-12 (×8): 5000 [IU] via SUBCUTANEOUS
  Filled 2013-11-09 (×11): qty 1

## 2013-11-09 MED ORDER — METOPROLOL TARTRATE 25 MG PO TABS
50.0000 mg | ORAL_TABLET | Freq: Once | ORAL | Status: AC
  Administered 2013-11-09: 50 mg via ORAL
  Filled 2013-11-09: qty 2

## 2013-11-09 MED ORDER — POTASSIUM CHLORIDE CRYS ER 20 MEQ PO TBCR
40.0000 meq | EXTENDED_RELEASE_TABLET | Freq: Every day | ORAL | Status: DC
  Administered 2013-11-10 – 2013-11-13 (×4): 40 meq via ORAL
  Filled 2013-11-09 (×5): qty 2

## 2013-11-09 MED ORDER — ONDANSETRON HCL 4 MG PO TABS: 4.0000 mg | ORAL_TABLET | Freq: Four times a day (QID) | ORAL | Status: DC | PRN

## 2013-11-09 MED ORDER — ACETAMINOPHEN 325 MG PO TABS: 650.0000 mg | ORAL_TABLET | Freq: Four times a day (QID) | ORAL | Status: DC | PRN

## 2013-11-09 NOTE — ED Notes (Addendum)
Pt reports recently being admitted for cp, had negative cath done and dc on Saturday. Left side chest pains returned today while working. HR 130 at triage. ekg done. Airway intact.

## 2013-11-09 NOTE — H&P (Signed)
Patient Demographics  Alejandro Reyes, is a 53 y.o. male  MRN: [id number]   DOB - [birth year]  Admit Date - 11/09/2013  Outpatient Primary MD for the patient is Scarlette Calico, MD   With History of -  Past Medical History  Diagnosis Date  . Hypertension   . Chronic systolic CHF (congestive heart failure), NYHA class 1   . Tobacco abuse   . COPD (chronic obstructive pulmonary disease)   . GERD (gastroesophageal reflux disease)   . Alcohol abuse, in remission   . Hyperthyroidism   . CAD (coronary artery disease)   . Pericarditis   . Pneumonia   . Hx of Graves' disease 11/03/2013      Past Surgical History  Procedure Laterality Date  . Tonsillectomy    . Esophagogastroduodenoscopy  08/26/2012    Procedure: ESOPHAGOGASTRODUODENOSCOPY (EGD);  Surgeon: Inda Castle, MD;  Location: [facility];  Service: Endoscopy;  Laterality: N/A;  . Appendectomy  1976    in for   Chief Complaint  Patient presents with  . Chest Pain     HPI  Alejandro Reyes  is a 53 y.o. male, with known history of coronary artery disease and pericarditis, presents with complaints of chest pain, the patient was here last week with similar complaints where he had cardiac catheter which did show nonobstructive chronic coronary artery disease, patient was noticed to have sinus tachycardia with heart rate of 150, complaining of mild shortness of breath, the patient had CT chest with IV contrast to rule out pulmonary embolism, CT chest came back negative for PE, but it did show anterior soft tissue mediastinal mass, and progressing in size once compared to old CT, ED physician discussed with telephone call I recommended the patient to be admitted for further evaluation of this mediastinal mass and for CT surgery to be consulted in the a.m. for  possible need of biopsy, the patient tachycardia resolved after he took his by mouth metoprolol which he reported he did skip today.    Review of Systems    In addition to the HPI above, No Fever-chills, No Headache, No changes with Vision or hearing, No problems swallowing food or Liquids, Pleuritic chest pain, denies Cough complaints of Shortness of Breath, No Abdominal pain, No Nausea or Vommitting, Bowel movements are regular, No Blood in stool or Urine, No dysuria, No new skin rashes or bruises, No new joints pains-aches,  No new weakness, tingling, numbness in any extremity, No recent weight gain or loss, No polyuria, polydypsia or polyphagia, No significant Mental Stressors.  A full 10 point Review of Systems was done, except as stated above, all other Review of Systems were negative.   Social History History  Substance Use Topics  . Smoking status: Current Every Day Smoker    Types: Cigarettes  . Smokeless tobacco: Never Used     Comment: pt has stopped smoking for a few days, uses nicorette gum  . Alcohol Use:  No     Family History Family History  Problem Relation Age of Onset  . Hypertension Father   . Cancer - Other Father     SCCa of Oropharynx  . Kidney disease Father   . Alcohol abuse Father   . Kidney disease Brother   . Heart disease Maternal Aunt   . Heart disease Paternal Aunt   . Heart disease Maternal Grandfather   . Cancer Neg Hx   . Diabetes Neg Hx   . Early death Neg Hx   . Hyperlipidemia Neg Hx   . Stroke Neg Hx      Prior to Admission medications   Medication Sig Start Date End Date Taking? Authorizing Provider  aspirin 325 MG tablet Take 325 mg by mouth daily. 11/03/13  Yes Evie Lacks Plotnikov, MD  atorvastatin (LIPITOR) 40 MG tablet Take 40 mg by mouth daily.   Yes Historical Provider, MD  isosorbide mononitrate (IMDUR) 30 MG 24 hr tablet Take 30 mg by mouth daily. 11/03/13  Yes Evie Lacks Plotnikov, MD  losartan-hydrochlorothiazide  (HYZAAR) 100-25 MG per tablet Take 1 tablet by mouth daily.   Yes Historical Provider, MD  metoprolol (LOPRESSOR) 50 MG tablet Take 50 mg by mouth 2 (two) times daily. 11/04/13  Yes Tarri Fuller, PA-C  nitroGLYCERIN (NITROSTAT) 0.4 MG SL tablet Place 0.4 mg under the tongue every 5 (five) minutes as needed for chest pain. 11/03/13  Yes Evie Lacks Plotnikov, MD  pantoprazole (PROTONIX) 40 MG tablet Take 1 tablet (40 mg total) by mouth 2 (two) times daily before a meal. 12/28/12  Yes Burtis Junes, NP  potassium chloride SA (K-DUR,KLOR-CON) 20 MEQ tablet Take 2 tablets (40 mEq total) by mouth daily. 12/19/12  Yes Burnell Blanks, MD  ibuprofen (ADVIL,MOTRIN) 800 MG tablet Take 800 mg by mouth daily as needed for fever, headache or moderate pain. 07/11/13   Biagio Borg, MD    No Known Allergies  Physical Exam  Vitals  Blood pressure 138/79, pulse 109, temperature 99.8 F (37.7 C), temperature source Oral, resp. rate 15, height 6' 0.84" (1.85 m), weight 103.1 kg (227 lb 4.7 oz), SpO2 98.00%.   1. General well-nourished male lying in bed in NAD,   2. Normal affect and insight, Not Suicidal or Homicidal, Awake Alert, Oriented X 3.  3. No F.N deficits, ALL C.Nerves Intact, Strength 5/5 all 4 extremities, Sensation intact all 4 extremities, Plantars down going.  4. Ears and Eyes appear Normal, Conjunctivae clear, PERRLA. Moist Oral Mucosa.  5. Supple Neck, No JVD, No cervical lymphadenopathy appriciated, No Carotid Bruits.  6. Symmetrical Chest wall movement, Good air movement bilaterally, CTAB.  7. RRR, No Gallops, Rubs or Murmurs, No Parasternal Heave.  8. Positive Bowel Sounds, Abdomen Soft, Non tender, No organomegaly appriciated,No rebound -guarding or rigidity.  9.  No Cyanosis, Normal Skin Turgor, No Skin Rash or Bruise.  10. Good muscle tone,  joints appear normal , no effusions, Normal ROM.  11. No Palpable Lymph Nodes in Neck or Axillae    Data Review  CBC  Recent  Labs Lab 11/03/13 1148 11/03/13 1958 11/04/13 0450 11/09/13 1351  WBC 5.5 5.4 4.2 5.6  HGB 13.9 12.5* 12.4* 13.5  HCT 39.8 37.6* 37.1* 39.6  PLT 157 133* 135* 145*  MCV 83.6 86.0 86.1 84.3  MCH 29.2 28.6 28.8 28.7  MCHC 34.9 33.2 33.4 34.1  RDW 12.6 12.8 12.7 12.4   ------------------------------------------------------------------------------------------------------------------  Chemistries   Recent Labs Lab 11/03/13 1148  11/03/13 1536 11/03/13 1958 11/04/13 0450 11/09/13 1351  NA 137  --   --  141 138  K 4.0  --   --  4.2 4.4  CL 99  --   --  103 99  CO2 25  --   --  25 25  GLUCOSE 94  --   --  95 106*  BUN 16  --   --  18 26*  CREATININE 0.86  --  0.79 0.93 1.01  CALCIUM 10.0  --   --  10.0 10.1  MG  --  1.7  --   --   --    ------------------------------------------------------------------------------------------------------------------ estimated creatinine clearance is 105.1 ml/min (by C-G formula based on Cr of 1.01). ------------------------------------------------------------------------------------------------------------------ No results found for this basename: TSH, T4TOTAL, FREET3, T3FREE, THYROIDAB,  in the last 72 hours   Coagulation profile  Recent Labs Lab 11/03/13 1536  INR 1.07   ------------------------------------------------------------------------------------------------------------------- No results found for this basename: DDIMER,  in the last 72 hours -------------------------------------------------------------------------------------------------------------------  Cardiac Enzymes  Recent Labs Lab 11/03/13 1958 11/04/13 0135 11/04/13 0745  TROPONINI 0.45* 0.63* 0.40*   ------------------------------------------------------------------------------------------------------------------ No components found with this basename: POCBNP,     ---------------------------------------------------------------------------------------------------------------  Urinalysis    Component Value Date/Time   COLORURINE LT. YELLOW 04/20/2013 0806   APPEARANCEUR CLEAR 04/20/2013 0806   LABSPEC 1.020 04/20/2013 0806   PHURINE 6.5 04/20/2013 0806   HGBUR NEGATIVE 04/20/2013 0806   HGBUR negative 11/15/2009 1002   BILIRUBINUR NEGATIVE 04/20/2013 0806   KETONESUR NEGATIVE 04/20/2013 0806   UROBILINOGEN 1.0 04/20/2013 0806   NITRITE NEGATIVE 04/20/2013 0806   LEUKOCYTESUR NEGATIVE 04/20/2013 0806    ----------------------------------------------------------------------------------------------------------------  Imaging results:   Dg Chest 2 View  11/09/2013   CLINICAL DATA:  Chest pain  EXAM: CHEST  2 VIEW  COMPARISON:  11/03/2013  FINDINGS: Cardiomediastinal silhouette is stable. No acute infiltrate or pleural effusion. No pulmonary edema. Bony thorax is unremarkable.  IMPRESSION: No active cardiopulmonary disease.   Electronically Signed   By: Lahoma Crocker M.D.   On: 11/09/2013 14:33   Dg Chest 2 View  11/03/2013   CLINICAL DATA:  Chest pain for 2 weeks, history hypertension, COPD, coronary artery disease post MI, smoking  EXAM: CHEST  2 VIEW  COMPARISON:  08/24/2012  FINDINGS: Normal heart size, mediastinal contours, and pulmonary vascularity.  Lungs clear.  No pleural effusion or pneumothorax.  Bones unremarkable.  IMPRESSION: No acute abnormalities.   Electronically Signed   By: Lavonia Dana M.D.   On: 11/03/2013 12:45   Ct Angio Chest Pe W/cm &/or Wo Cm  11/09/2013   CLINICAL DATA:  Chest pain for 3 weeks  EXAM: CT ANGIOGRAPHY CHEST WITH CONTRAST  TECHNIQUE: Multidetector CT imaging of the chest was performed using the standard protocol during bolus administration of intravenous contrast. Multiplanar CT image reconstructions including MIPs were obtained to evaluate the vascular anatomy.  CONTRAST:  158mL OMNIPAQUE IOHEXOL 350 MG/ML SOLN  COMPARISON:  Chest  x-ray November 09, 2013, chest CT October 29, 2009  FINDINGS: There is no pulmonary embolus. The aorta is normal. There is soft tissue in the anterior mediastinum increased compared to prior exam measuring 4.2 x 2.2 cm. There is no mediastinal or hilar lymphadenopathy. The heart size is normal. There is no focal pneumonia, pleural effusion or pulmonary mass. Focal scar is identified in the right apex unchanged. The previously that noted nodule in the lateral right upper lobe is not seen today. There is chronic mild patchy  opacity in the right perihilar region unchanged. There is a stable 2.1 cm nodule in the left adrenal gland unchanged. There is a small hiatal hernia. There is a 1.1 cm low density lesion in the right lobe liver unchanged compared to prior exam. The other visualized upper abdominal structures are unremarkable.  Review of the MIP images confirms the above findings.  IMPRESSION: No pulmonary embolus.  No acute abnormality within the lungs.  Abnormal soft tissue in the anterior mediastinum measuring 4.2 x 2.2 cm increase compared to prior exam, neoplasm not excluded.   Electronically Signed   By: Abelardo Diesel M.D.   On: 11/09/2013 16:52        Assessment & Plan  Principal Problem:   Mediastinal mass Active Problems:   HYPERTENSION   COPD   Systolic CHF, chronic   GERD (gastroesophageal reflux disease)   Chest pain, atypical   Tachycardia    1. mediastinal mass, the patient had incidental finding on CT chest with IV contrast for progressing mediastinal mass and sized, as per ED discussion with him cloche on call, patient will be admitted for further workup, would have morning team consult CT surgery for possible need of biopsy 2. Atypical chest pain: This is most likely related to pericarditis, patient had cardiac cath done last week which did not show any obstructive coronary artery disease, the patient is already on aspirin, will keep him on NSAIDs as well, will continue to cycle  troponins. 3. Tachycardia, it appears to be sinus tachycardia, most likely related to not taking his beta blockers, currently patient heart rate is in the low 100s to high 90s after receiving his beta blockers, patient's CT chest is negative for PE,. 4. History of congestive heart failure systolic, currently appears to be compensated, patient is already on beta blockers, aspirin, ACE inhibitor . 5. COPD. Patient does not have any wheezing, we'll continue with when necessary albuterol 6. Gastroesophageal reflux disease. Continue with PPIs 7. Tobacco abuse. The patient was counseled and encouraged to stop smoking  DVT Prophylaxis Heparin -   AM Labs Ordered, also please review Full Orders  Family Communication: Admission, patients condition and plan of care including tests being ordered have been discussed with the patient   who indicate understanding and agree with the plan and Code Status.  Code Status full  Likely DC to  Home  Condition GUARDED Time spent in minutes : 50 min    Tache Bobst M.D on 11/09/2013 at 7:16 PM  Between 7am to 7pm -   After 7pm go to [email or web address] - password [email or web address]  And look for the night coverage person covering me after hours  [facility] Office  [phone]

## 2013-11-09 NOTE — ED Provider Notes (Signed)
CSN: [id number]     Arrival date & time 11/09/13  1339 History   First MD Initiated Contact with Patient 11/09/13 1504     Chief Complaint  Patient presents with  . Chest Pain   (Consider location/radiation/quality/duration/timing/severity/associated sxs/prior Treatment) Patient is a 53 y.o. male presenting with chest pain.  Chest Pain  Pt with multiple medical problems recently admitted for CP and pos Trop had cath neg for obstructive disease. He reports he has continued to have moderate to severe chest pressure intermittently since discharged, partially improved with NTG at home. He reports severe pain during work last night continued this morning and so he returned to the ED for eval. He did not take his home meds this morning. Pt denies SOB. No pain at the time of my evaluation.    Past Medical History  Diagnosis Date  . Hypertension   . Chronic systolic CHF (congestive heart failure), NYHA class 1   . Tobacco abuse   . COPD (chronic obstructive pulmonary disease)   . GERD (gastroesophageal reflux disease)   . Alcohol abuse, in remission   . Hyperthyroidism   . CAD (coronary artery disease)   . Pericarditis   . Pneumonia   . Hx of Graves' disease 11/03/2013   Past Surgical History  Procedure Laterality Date  . Tonsillectomy    . Esophagogastroduodenoscopy  08/26/2012    Procedure: ESOPHAGOGASTRODUODENOSCOPY (EGD);  Surgeon: Inda Castle, MD;  Location: [facility];  Service: Endoscopy;  Laterality: N/A;  . Appendectomy  1976   Family History  Problem Relation Age of Onset  . Hypertension Father   . Cancer - Other Father     SCCa of Oropharynx  . Kidney disease Father   . Alcohol abuse Father   . Kidney disease Brother   . Heart disease Maternal Aunt   . Heart disease Paternal Aunt   . Heart disease Maternal Grandfather   . Cancer Neg Hx   . Diabetes Neg Hx   . Early death Neg Hx   . Hyperlipidemia Neg Hx   . Stroke Neg Hx    History  Substance Use Topics  .  Smoking status: Current Every Day Smoker    Types: Cigarettes  . Smokeless tobacco: Never Used     Comment: pt has stopped smoking for a few days, uses nicorette gum  . Alcohol Use: No    Review of Systems  Cardiovascular: Positive for chest pain.   All other systems reviewed and are negative except as noted in HPI.   Allergies  Review of patient's allergies indicates no known allergies.  Home Medications   Current Outpatient Rx  Name  Route  Sig  Dispense  Refill  . aspirin 325 MG tablet   Oral   Take 325 mg by mouth daily.         Marland Kitchen atorvastatin (LIPITOR) 40 MG tablet   Oral   Take 40 mg by mouth daily.         . isosorbide mononitrate (IMDUR) 30 MG 24 hr tablet   Oral   Take 30 mg by mouth daily.         Marland Kitchen losartan-hydrochlorothiazide (HYZAAR) 100-25 MG per tablet   Oral   Take 1 tablet by mouth daily.         . metoprolol (LOPRESSOR) 50 MG tablet   Oral   Take 50 mg by mouth 2 (two) times daily.         . nitroGLYCERIN (NITROSTAT) 0.4 MG  SL tablet   Sublingual   Place 0.4 mg under the tongue every 5 (five) minutes as needed for chest pain.         . pantoprazole (PROTONIX) 40 MG tablet   Oral   Take 1 tablet (40 mg total) by mouth 2 (two) times daily before a meal.   60 tablet   6   . potassium chloride SA (K-DUR,KLOR-CON) 20 MEQ tablet   Oral   Take 2 tablets (40 mEq total) by mouth daily.   60 tablet   6   . ibuprofen (ADVIL,MOTRIN) 800 MG tablet   Oral   Take 800 mg by mouth daily as needed for fever, headache or moderate pain.          BP 147/60  Pulse 100  Temp(Src) 99.8 F (37.7 C) (Oral)  Resp 20  SpO2 100% Physical Exam  Nursing note and vitals reviewed. Constitutional: He is oriented to person, place, and time. He appears well-developed and well-nourished.  HENT:  Head: Normocephalic and atraumatic.  Eyes: EOM are normal. Pupils are equal, round, and reactive to light.  Neck: Normal range of motion. Neck supple.   Cardiovascular: Normal heart sounds and intact distal pulses.  Tachycardia present.   Pulmonary/Chest: Effort normal and breath sounds normal.  Abdominal: Bowel sounds are normal. He exhibits no distension. There is no tenderness.  Musculoskeletal: Normal range of motion. He exhibits no edema and no tenderness.  Neurological: He is alert and oriented to person, place, and time. He has normal strength. No cranial nerve deficit or sensory deficit.  Skin: Skin is warm and dry. No rash noted.  Psychiatric: He has a normal mood and affect.    ED Course  Procedures (including critical care time) Labs Review Labs Reviewed  CBC - Abnormal; Notable for the following:    Platelets 145 (*)    All other components within normal limits  BASIC METABOLIC PANEL - Abnormal; Notable for the following:    Glucose, Bld 106 (*)    BUN 26 (*)    GFR calc non Af Amer 82 (*)    All other components within normal limits  PRO B NATRIURETIC PEPTIDE - Abnormal; Notable for the following:    Pro B Natriuretic peptide (BNP) 167.9 (*)    All other components within normal limits  POCT I-STAT TROPONIN I   Imaging Review Dg Chest 2 View  11/09/2013   CLINICAL DATA:  Chest pain  EXAM: CHEST  2 VIEW  COMPARISON:  11/03/2013  FINDINGS: Cardiomediastinal silhouette is stable. No acute infiltrate or pleural effusion. No pulmonary edema. Bony thorax is unremarkable.  IMPRESSION: No active cardiopulmonary disease.   Electronically Signed   By: Lahoma Crocker M.D.   On: 11/09/2013 14:33   Ct Angio Chest Pe W/cm &/or Wo Cm  11/09/2013   CLINICAL DATA:  Chest pain for 3 weeks  EXAM: CT ANGIOGRAPHY CHEST WITH CONTRAST  TECHNIQUE: Multidetector CT imaging of the chest was performed using the standard protocol during bolus administration of intravenous contrast. Multiplanar CT image reconstructions including MIPs were obtained to evaluate the vascular anatomy.  CONTRAST:  123mL OMNIPAQUE IOHEXOL 350 MG/ML SOLN  COMPARISON:  Chest  x-ray November 09, 2013, chest CT October 29, 2009  FINDINGS: There is no pulmonary embolus. The aorta is normal. There is soft tissue in the anterior mediastinum increased compared to prior exam measuring 4.2 x 2.2 cm. There is no mediastinal or hilar lymphadenopathy. The heart size is normal. There is  no focal pneumonia, pleural effusion or pulmonary mass. Focal scar is identified in the right apex unchanged. The previously that noted nodule in the lateral right upper lobe is not seen today. There is chronic mild patchy opacity in the right perihilar region unchanged. There is a stable 2.1 cm nodule in the left adrenal gland unchanged. There is a small hiatal hernia. There is a 1.1 cm low density lesion in the right lobe liver unchanged compared to prior exam. The other visualized upper abdominal structures are unremarkable.  Review of the MIP images confirms the above findings.  IMPRESSION: No pulmonary embolus.  No acute abnormality within the lungs.  Abnormal soft tissue in the anterior mediastinum measuring 4.2 x 2.2 cm increase compared to prior exam, neoplasm not excluded.   Electronically Signed   By: Abelardo Diesel M.D.   On: 11/09/2013 16:52    EKG Interpretation    Date/Time:  Thursday November 09 2013 13:43:26 EST Ventricular Rate:  131 PR Interval:  168 QRS Duration: 102 QT Interval:  284 QTC Calculation: 419 R Axis:   -43 Text Interpretation:  Sinus tachycardia Left axis deviation Nonspecific ST and T wave abnormality Abnormal ECG No significant change since last tracing Confirmed by SHELDON  MD, CHARLES ([id number]) on 11/09/2013 4:42:40 PM            MDM   1. Mediastinal mass   2. Chest pain   3. Tachycardia   4. Chest pain, atypical   5. Chronic airway obstruction, not elsewhere classified   6. GERD (gastroesophageal reflux disease)   7. Systolic CHF, chronic     Pt with continued chest pains despite recent cath neg for obstructive disease. Symptoms at that time felt to be  ?pericarditis. With significant tachycardia today sent for CTA as above which was neg for PE but shows mediastinal mass which may be causing his symptoms. Tachycardia improved with home Lopressor dose. Discussed with Dr. Radford Pax on call for Cardiology who agrees unlikely to be CAD as cause of his symptoms. Will also discuss with Oncology regarding workup of the mediastinal mass. Repeat EKG shows improved HR, ST/T abnormalities unchanged. Discussed this plan with the patient who reports pain has returned. Morphine ordered for comfort.     Charles B. Karle Starch, MD 11/10/13 [phone]

## 2013-11-10 DIAGNOSIS — Z862 Personal history of diseases of the blood and blood-forming organs and certain disorders involving the immune mechanism: Secondary | ICD-10-CM

## 2013-11-10 DIAGNOSIS — I10 Essential (primary) hypertension: Secondary | ICD-10-CM

## 2013-11-10 DIAGNOSIS — Z8639 Personal history of other endocrine, nutritional and metabolic disease: Secondary | ICD-10-CM

## 2013-11-10 LAB — BASIC METABOLIC PANEL
BUN: 23 mg/dL (ref 6–23)
CO2: 25 meq/L (ref 19–32)
CREATININE: 1.04 mg/dL (ref 0.50–1.35)
Calcium: 10 mg/dL (ref 8.4–10.5)
Chloride: 102 mEq/L (ref 96–112)
GFR calc Af Amer: >90 mL/min (ref >90)
GFR calc non Af Amer: 80 mL/min — ABNORMAL LOW (ref >90)
Glucose, Bld: 104 mg/dL — ABNORMAL HIGH (ref 70–99)
Potassium: 4.6 mEq/L (ref 3.7–5.3)
Sodium: 139 mEq/L (ref 137–147)

## 2013-11-10 LAB — CBC
HEMATOCRIT: 36.3 % — AB (ref 39.0–52.0)
Hemoglobin: 12.2 g/dL — ABNORMAL LOW (ref 13.0–17.0)
MCH: 28.7 pg (ref 26.0–34.0)
MCHC: 33.6 g/dL (ref 30.0–36.0)
MCV: 85.4 fL (ref 78.0–100.0)
Platelets: 159 10*3/uL (ref 150–400)
RBC: 4.25 MIL/uL (ref 4.22–5.81)
RDW: 12.7 % (ref 11.5–15.5)
WBC: 5 10*3/uL (ref 4.0–10.5)

## 2013-11-10 LAB — TROPONIN I
Troponin I: <0.3 ng/mL (ref <0.30)
Troponin I: <0.3 ng/mL (ref <0.30)

## 2013-11-10 LAB — PROTIME-INR
INR: 1.08 (ref 0.00–1.49)
Prothrombin Time: 13.8 seconds (ref 11.6–15.2)

## 2013-11-10 NOTE — Progress Notes (Signed)
[facility] PROGRESS NOTE  Alejandro Reyes [id number] DOB: [birth year] DOA: 11/09/2013 PCP: Scarlette Calico, MD  Assessment/Plan: 1. mediastinal mass, the patient had incidental finding on CT chest with IV contrast for progressing mediastinal mass and sized, as per ED discussion with him cloche on call, patient will be admitted for further workup, consulted CT surgery for possible need of biopsy, will see patient later today.   2. Atypical chest pain: Resolved now. This is most likely related to pericarditis, patient had cardiac cath done last week which did not show any obstructive coronary artery disease, the patient is already on aspirin, will keep him on NSAIDs as well, troponins negative.   3. Tachycardia, it appears to be sinus tachycardia, most likely related to not taking his beta blockers, currently patient heart rate is in the low 100s to high 90s after receiving his beta blockers, patient's CT chest is negative for PE.   4. History of congestive heart failure systolic, currently appears to be compensated, patient is already on beta blockers, aspirin, ACE inhibitor. Cutting back on IVFs today.   5. COPD. continue with when necessary albuterol   6. Gastroesophageal reflux disease. Continue with PPIs   7. Tobacco abuse. The patient was counseled and encouraged to stop smoking. The patient was counseled on the dangers of tobacco use, and was advised to quit.  Reviewed strategies to maximize success, including removing cigarettes and smoking materials from environment and stress management.  DVT Prophylaxis Heparin -   Family Communication: Admission, patients condition and plan of care including tests being ordered have been discussed with the patient  who indicate understanding and agree with the plan and Code Status.   Code Status full   Condition GUARDED  HPI/Subjective: Pt says he feels much better today.   Objective: Filed Vitals:   11/10/13 0521  BP: 111/58  Pulse:  94  Temp: 98.3 F (36.8 C)  Resp: 18    Intake/Output Summary (Last 24 hours) at 11/10/13 0744 Last data filed at 11/10/13 0700  Gross per 24 hour  Intake  497.5 ml  Output      0 ml  Net  497.5 ml   Filed Weights   11/09/13 1700 11/09/13 2031  Weight: 227 lb 4.7 oz (103.1 kg) 217 lb 4.8 oz (98.567 kg)    Exam:   General:  Awake, alert, no distress  Cardiovascular: normal s1, s2 sounds   Respiratory: BBS with occasional exp wheezes   Abdomen: soft, nondistended, nontender, no masses  Musculoskeletal: no CCE   Data Reviewed: Basic Metabolic Panel:  Recent Labs Lab 11/03/13 1148 11/03/13 1536 11/03/13 1958 11/04/13 0450 11/09/13 1351 11/10/13 0410  NA 137  --   --  141 138 139  K 4.0  --   --  4.2 4.4 4.6  CL 99  --   --  103 99 102  CO2 25  --   --  25 25 25   GLUCOSE 94  --   --  95 106* 104*  BUN 16  --   --  18 26* 23  CREATININE 0.86  --  0.79 0.93 1.01 1.04  CALCIUM 10.0  --   --  10.0 10.1 10.0  MG  --  1.7  --   --   --   --    Liver Function Tests: No results found for this basename: AST, ALT, ALKPHOS, BILITOT, PROT, ALBUMIN,  in the last 168 hours No results found for this basename: LIPASE, AMYLASE,  in  the last 168 hours No results found for this basename: AMMONIA,  in the last 168 hours CBC:  Recent Labs Lab 11/03/13 1148 11/03/13 1958 11/04/13 0450 11/09/13 1351 11/10/13 0410  WBC 5.5 5.4 4.2 5.6 5.0  HGB 13.9 12.5* 12.4* 13.5 12.2*  HCT 39.8 37.6* 37.1* 39.6 36.3*  MCV 83.6 86.0 86.1 84.3 85.4  PLT 157 133* 135* 145* 159   Cardiac Enzymes:  Recent Labs Lab 11/03/13 1958 11/04/13 0135 11/04/13 0745 11/09/13 2122 11/10/13 0410  TROPONINI 0.45* 0.63* 0.40* <0.30 <0.30   BNP (last 3 results)  Recent Labs  11/03/13 1536 11/09/13 1351  PROBNP 89.1 167.9*   CBG: No results found for this basename: GLUCAP,  in the last 168 hours  No results found for this or any previous visit (from the past 240 hour(s)).   Studies: Dg  Chest 2 View  11/09/2013   CLINICAL DATA:  Chest pain  EXAM: CHEST  2 VIEW  COMPARISON:  11/03/2013  FINDINGS: Cardiomediastinal silhouette is stable. No acute infiltrate or pleural effusion. No pulmonary edema. Bony thorax is unremarkable.  IMPRESSION: No active cardiopulmonary disease.   Electronically Signed   By: Lahoma Crocker M.D.   On: 11/09/2013 14:33   Ct Angio Chest Pe W/cm &/or Wo Cm  11/09/2013   CLINICAL DATA:  Chest pain for 3 weeks  EXAM: CT ANGIOGRAPHY CHEST WITH CONTRAST  TECHNIQUE: Multidetector CT imaging of the chest was performed using the standard protocol during bolus administration of intravenous contrast. Multiplanar CT image reconstructions including MIPs were obtained to evaluate the vascular anatomy.  CONTRAST:  130mL OMNIPAQUE IOHEXOL 350 MG/ML SOLN  COMPARISON:  Chest x-ray November 09, 2013, chest CT October 29, 2009  FINDINGS: There is no pulmonary embolus. The aorta is normal. There is soft tissue in the anterior mediastinum increased compared to prior exam measuring 4.2 x 2.2 cm. There is no mediastinal or hilar lymphadenopathy. The heart size is normal. There is no focal pneumonia, pleural effusion or pulmonary mass. Focal scar is identified in the right apex unchanged. The previously that noted nodule in the lateral right upper lobe is not seen today. There is chronic mild patchy opacity in the right perihilar region unchanged. There is a stable 2.1 cm nodule in the left adrenal gland unchanged. There is a small hiatal hernia. There is a 1.1 cm low density lesion in the right lobe liver unchanged compared to prior exam. The other visualized upper abdominal structures are unremarkable.  Review of the MIP images confirms the above findings.  IMPRESSION: No pulmonary embolus.  No acute abnormality within the lungs.  Abnormal soft tissue in the anterior mediastinum measuring 4.2 x 2.2 cm increase compared to prior exam, neoplasm not excluded.   Electronically Signed   By: Abelardo Diesel  M.D.   On: 11/09/2013 16:52   Scheduled Meds: . aspirin  325 mg Oral Daily  . atorvastatin  40 mg Oral q1800  . heparin  5,000 Units Subcutaneous Q8H  . hydrochlorothiazide  25 mg Oral Daily  . isosorbide mononitrate  30 mg Oral Daily  . losartan  100 mg Oral Daily  . metoprolol  50 mg Oral BID  . naproxen  250 mg Oral BID WC  . pantoprazole  40 mg Oral BID AC  . potassium chloride SA  40 mEq Oral Daily  . sodium chloride  3 mL Intravenous Q12H   Continuous Infusions: . sodium chloride 50 mL/hr at 11/09/13 2103   Principal Problem:  Mediastinal mass Active Problems:   HYPERTENSION   COPD   Systolic CHF, chronic   GERD (gastroesophageal reflux disease)   Chest pain, atypical   Tachycardia  [facility]  [facility] Pager [phone]. If 7PM-7AM, please contact night-coverage at [email or web address], password [facility] 11/10/2013, 7:44 AM  LOS: 1 day

## 2013-11-10 NOTE — Progress Notes (Signed)
Nutrition Brief Note  Patient identified on the Malnutrition Screening Tool (MST) Report for unintentional weight loss. He reports that the pounds are "just falling off." Suspect some of his weight loss is related to fluid status with chronic HF. More recent weight loss could also be related to newly discovered mediastinal mass. Patient has been eating very well.  Wt Readings from Last 15 Encounters:  11/09/13 217 lb 4.8 oz (98.567 kg)  11/04/13 227 lb 4.8 oz (103.103 kg)  11/03/13 230 lb (104.327 kg)  11/04/13 227 lb 4.8 oz (103.103 kg)  08/31/13 237 lb (107.502 kg)  07/18/13 245 lb 11.2 oz (111.449 kg)  07/11/13 241 lb 2 oz (109.374 kg)  06/06/13 233 lb (105.688 kg)  05/08/13 234 lb 6.4 oz (106.323 kg)  04/18/13 232 lb (105.235 kg)  01/02/13 249 lb (112.946 kg)  12/30/12 249 lb 1.9 oz (113 kg)  12/06/12 238 lb (107.956 kg)  11/29/12 240 lb (108.863 kg)  10/27/12 238 lb 4 oz (108.069 kg)    Body mass index is 28.68 kg/(m^2). Patient meets criteria for overweight based on current BMI.   Current diet order is heart healthy, patient is consuming approximately 100% of meals at this time and is still hungry. Labs and medications reviewed.   No nutrition interventions warranted at this time. If nutrition issues arise, please consult RD.    Molli Barrows, RD, LDN, [place] Pager [phone] After Hours Pager [phone]

## 2013-11-10 NOTE — Progress Notes (Signed)
UR completed 

## 2013-11-11 DIAGNOSIS — R222 Localized swelling, mass and lump, trunk: Secondary | ICD-10-CM

## 2013-11-11 DIAGNOSIS — I2510 Atherosclerotic heart disease of native coronary artery without angina pectoris: Secondary | ICD-10-CM

## 2013-11-11 LAB — SURGICAL PCR SCREEN
MRSA, PCR: NEGATIVE
STAPHYLOCOCCUS AUREUS: NEGATIVE

## 2013-11-11 MED ORDER — MORPHINE SULFATE 2 MG/ML IJ SOLN
INTRAMUSCULAR | Status: AC
  Filled 2013-11-11: qty 1

## 2013-11-11 MED ORDER — MORPHINE SULFATE 2 MG/ML IJ SOLN
1.0000 mg | INTRAMUSCULAR | Status: DC | PRN
  Administered 2013-11-11 – 2013-11-13 (×5): 2 mg via INTRAVENOUS
  Filled 2013-11-11 (×4): qty 1

## 2013-11-11 NOTE — Progress Notes (Signed)
  Subjective:  Had an episode of the same chest pain this am but better now.  Objective: Vital signs in last 24 hours: Temp:  [97.9 F (36.6 C)-98.4 F (36.9 C)] 98.1 F (36.7 C) (01/24 1354) Pulse Rate:  [74-88] 85 (01/24 1354) Cardiac Rhythm:  [-] Normal sinus rhythm (01/24 0859) Resp:  [18-20] 20 (01/24 1354) BP: (104-116)/(47-70) 111/70 mmHg (01/24 1354) SpO2:  [93 %-98 %] 93 % (01/24 1354)  Hemodynamic parameters for last 24 hours:    Intake/Output from previous day:   Intake/Output this shift: Total I/O In: 600 [P.O.:600] Out: -   General appearance: alert, cooperative and looks comfortable watching TV Heart: regular rate and rhythm, S1, S2 normal, no murmur, click, rub or gallop Lungs: clear to auscultation bilaterally  Lab Results:  Recent Labs  11/09/13 1351 11/10/13 0410  WBC 5.6 5.0  HGB 13.5 12.2*  HCT 39.6 36.3*  PLT 145* 159   BMET:  Recent Labs  11/09/13 1351 11/10/13 0410  NA 138 139  K 4.4 4.6  CL 99 102  CO2 25 25  GLUCOSE 106* 104*  BUN 26* 23  CREATININE 1.01 1.04  CALCIUM 10.1 10.0    PT/INR:  Recent Labs  11/10/13 0410  LABPROT 13.8  INR 1.08   ABG    Component Value Date/Time   TCO2 25 08/31/2013 0612   CBG (last 3)  No results found for this basename: GLUCAP,  in the last 72 hours  Assessment/Plan: He has decided to stay in the hospital and proceed with surgical resection on Monday.   LOS: 2 days    Gilford Raid K 11/11/2013

## 2013-11-11 NOTE — Consult Note (Signed)
[address]       [place] [address]             [phone]       Reason for Consult: Anterior mediastinal mass Referring Physician:  Irwin Brakeman, MD  Alejandro Reyes is an 53 y.o. male.  HPI:   The patient is a 53 year old black male with a history of mild, non-obstructive coronary disease, HTN, chronic systolic CHF, smoking and COPD who reports a several year history of chest pain. He says he had an MI in November 2013 and ever since has had recurrent substernal chest pain that is present most of the time and sometimes is quite severe. It is not exertionally related and is made worse by lying down. He was in the hospital a couple weeks ago for the chest pain and an echo was unremarkable. His troponin was positive and ECG showed inferior and lateral ST depression with sinus tach 126. He was cathed and it showed mild, non-obstructive coronary disease. It was felt that he most likely had pericarditis. He had a chest CT which ruled out PE but did show an enlarging anterior mediastinal mass compared to prior CT on 10/29/2009 and 08/13/2012.   Past Medical History  Diagnosis Date  . Hypertension   . Chronic systolic CHF (congestive heart failure), NYHA class 1   . Tobacco abuse   . COPD (chronic obstructive pulmonary disease)   . GERD (gastroesophageal reflux disease)   . Alcohol abuse, in remission   . Hyperthyroidism   . CAD (coronary artery disease)   . Pericarditis   . Pneumonia   . Hx of Graves' disease 11/03/2013    Past Surgical History  Procedure Laterality Date  . Tonsillectomy    . Esophagogastroduodenoscopy  08/26/2012    Procedure: ESOPHAGOGASTRODUODENOSCOPY (EGD);  Surgeon: Inda Castle, MD;  Location: [facility];  Service: Endoscopy;  Laterality: N/A;  . Appendectomy  1976    Family History  Problem Relation Age of Onset  . Hypertension Father   . Cancer - Other Father     SCCa of Oropharynx  . Kidney disease Father   . Alcohol  abuse Father   . Kidney disease Brother   . Heart disease Maternal Aunt   . Heart disease Paternal Aunt   . Heart disease Maternal Grandfather   . Cancer Neg Hx   . Diabetes Neg Hx   . Early death Neg Hx   . Hyperlipidemia Neg Hx   . Stroke Neg Hx     Social History:  reports that he has been smoking Cigarettes.  He has been smoking about 0.00 packs per day. He has never used smokeless tobacco. He reports that he does not drink alcohol or use illicit drugs.  Allergies: No Known Allergies  Medications:  I have reviewed the patient's current medications. Prior to Admission:  Prescriptions prior to admission  Medication Sig Dispense Refill  . aspirin 325 MG tablet Take 325 mg by mouth daily.      Marland Kitchen atorvastatin (LIPITOR) 40 MG tablet Take 40 mg by mouth daily.      . isosorbide mononitrate (IMDUR) 30 MG 24 hr tablet Take 30 mg by mouth daily.      Marland Kitchen losartan-hydrochlorothiazide (HYZAAR) 100-25 MG per tablet Take 1 tablet by mouth daily.      . metoprolol (LOPRESSOR) 50 MG tablet Take 50 mg by mouth 2 (two) times daily.      . nitroGLYCERIN (  NITROSTAT) 0.4 MG SL tablet Place 0.4 mg under the tongue every 5 (five) minutes as needed for chest pain.      . pantoprazole (PROTONIX) 40 MG tablet Take 1 tablet (40 mg total) by mouth 2 (two) times daily before a meal.  60 tablet  6  . potassium chloride SA (K-DUR,KLOR-CON) 20 MEQ tablet Take 2 tablets (40 mEq total) by mouth daily.  60 tablet  6  . ibuprofen (ADVIL,MOTRIN) 800 MG tablet Take 800 mg by mouth daily as needed for fever, headache or moderate pain.       Scheduled: . aspirin  325 mg Oral Daily  . atorvastatin  40 mg Oral q1800  . heparin  5,000 Units Subcutaneous Q8H  . hydrochlorothiazide  25 mg Oral Daily  . isosorbide mononitrate  30 mg Oral Daily  . losartan  100 mg Oral Daily  . metoprolol  50 mg Oral BID  . naproxen  250 mg Oral BID WC  . pantoprazole  40 mg Oral BID AC  . potassium chloride SA  40 mEq Oral Daily  .  sodium chloride  3 mL Intravenous Q12H   Continuous: . sodium chloride 1,000 mL (11/10/13 0855)   IAX:[id number], acetaminophen, albuterol, alum & mag hydroxide-simeth, HYDROcodone-acetaminophen, nitroGLYCERIN, ondansetron (ZOFRAN) IV, ondansetron Anti-infectives   None      Results for orders placed during the hospital encounter of 11/09/13 (from the past 48 hour(s))  CBC     Status: Abnormal   Collection Time    11/09/13  1:51 PM      Result Value Range   WBC 5.6  4.0 - 10.5 K/uL   RBC 4.70  4.22 - 5.81 MIL/uL   Hemoglobin 13.5  13.0 - 17.0 g/dL   HCT 39.6  39.0 - 52.0 %   MCV 84.3  78.0 - 100.0 fL   MCH 28.7  26.0 - 34.0 pg   MCHC 34.1  30.0 - 36.0 g/dL   RDW 12.4  11.5 - 15.5 %   Platelets 145 (*) 150 - 400 K/uL  BASIC METABOLIC PANEL     Status: Abnormal   Collection Time    11/09/13  1:51 PM      Result Value Range   Sodium 138  137 - 147 mEq/L   Potassium 4.4  3.7 - 5.3 mEq/L   Chloride 99  96 - 112 mEq/L   CO2 25  19 - 32 mEq/L   Glucose, Bld 106 (*) 70 - 99 mg/dL   BUN 26 (*) 6 - 23 mg/dL   Creatinine, Ser 1.01  0.50 - 1.35 mg/dL   Calcium 10.1  8.4 - 10.5 mg/dL   GFR calc non Af Amer 82 (*) >90 mL/min   GFR calc Af Amer >90  >90 mL/min   Comment: (NOTE)     The eGFR has been calculated using the CKD EPI equation.     This calculation has not been validated in all clinical situations.     eGFR's persistently <90 mL/min signify possible Chronic Kidney     Disease.  PRO B NATRIURETIC PEPTIDE     Status: Abnormal   Collection Time    11/09/13  1:51 PM      Result Value Range   Pro B Natriuretic peptide (BNP) 167.9 (*) 0 - 125 pg/mL  POCT I-STAT TROPONIN I     Status: None   Collection Time    11/09/13  2:22 PM      Result Value Range  Troponin i, poc 0.01  0.00 - 0.08 ng/mL   Comment 3            Comment: Due to the release kinetics of cTnI,     a negative result within the first hours     of the onset of symptoms does not rule out     myocardial  infarction with certainty.     If myocardial infarction is still suspected,     repeat the test at appropriate intervals.  POCT I-STAT TROPONIN I     Status: None   Collection Time    11/09/13  6:10 PM      Result Value Range   Troponin i, poc 0.02  0.00 - 0.08 ng/mL   Comment 3            Comment: Due to the release kinetics of cTnI,     a negative result within the first hours     of the onset of symptoms does not rule out     myocardial infarction with certainty.     If myocardial infarction is still suspected,     repeat the test at appropriate intervals.  TROPONIN I     Status: None   Collection Time    11/09/13  9:22 PM      Result Value Range   Troponin I <0.30  <0.30 ng/mL   Comment:            Due to the release kinetics of cTnI,     a negative result within the first hours     of the onset of symptoms does not rule out     myocardial infarction with certainty.     If myocardial infarction is still suspected,     repeat the test at appropriate intervals.  BASIC METABOLIC PANEL     Status: Abnormal   Collection Time    11/10/13  4:10 AM      Result Value Range   Sodium 139  137 - 147 mEq/L   Potassium 4.6  3.7 - 5.3 mEq/L   Chloride 102  96 - 112 mEq/L   CO2 25  19 - 32 mEq/L   Glucose, Bld 104 (*) 70 - 99 mg/dL   BUN 23  6 - 23 mg/dL   Creatinine, Ser 1.04  0.50 - 1.35 mg/dL   Calcium 10.0  8.4 - 10.5 mg/dL   GFR calc non Af Amer 80 (*) >90 mL/min   GFR calc Af Amer >90  >90 mL/min   Comment: (NOTE)     The eGFR has been calculated using the CKD EPI equation.     This calculation has not been validated in all clinical situations.     eGFR's persistently <90 mL/min signify possible Chronic Kidney     Disease.  CBC     Status: Abnormal   Collection Time    11/10/13  4:10 AM      Result Value Range   WBC 5.0  4.0 - 10.5 K/uL   RBC 4.25  4.22 - 5.81 MIL/uL   Hemoglobin 12.2 (*) 13.0 - 17.0 g/dL   HCT 36.3 (*) 39.0 - 52.0 %   MCV 85.4  78.0 - 100.0 fL   MCH  28.7  26.0 - 34.0 pg   MCHC 33.6  30.0 - 36.0 g/dL   RDW 12.7  11.5 - 15.5 %   Platelets 159  150 - 400 K/uL  PROTIME-INR     Status: None  Collection Time    11/10/13  4:10 AM      Result Value Range   Prothrombin Time 13.8  11.6 - 15.2 seconds   INR 1.08  0.00 - 1.49  TROPONIN I     Status: None   Collection Time    11/10/13  4:10 AM      Result Value Range   Troponin I <0.30  <0.30 ng/mL   Comment:            Due to the release kinetics of cTnI,     a negative result within the first hours     of the onset of symptoms does not rule out     myocardial infarction with certainty.     If myocardial infarction is still suspected,     repeat the test at appropriate intervals.  TROPONIN I     Status: None   Collection Time    11/10/13  8:20 AM      Result Value Range   Troponin I <0.30  <0.30 ng/mL   Comment:            Due to the release kinetics of cTnI,     a negative result within the first hours     of the onset of symptoms does not rule out     myocardial infarction with certainty.     If myocardial infarction is still suspected,     repeat the test at appropriate intervals.    Dg Chest 2 View  11/09/2013   CLINICAL DATA:  Chest pain  EXAM: CHEST  2 VIEW  COMPARISON:  11/03/2013  FINDINGS: Cardiomediastinal silhouette is stable. No acute infiltrate or pleural effusion. No pulmonary edema. Bony thorax is unremarkable.  IMPRESSION: No active cardiopulmonary disease.   Electronically Signed   By: Lahoma Crocker M.D.   On: 11/09/2013 14:33   Ct Angio Chest Pe W/cm &/or Wo Cm  11/09/2013   CLINICAL DATA:  Chest pain for 3 weeks  EXAM: CT ANGIOGRAPHY CHEST WITH CONTRAST  TECHNIQUE: Multidetector CT imaging of the chest was performed using the standard protocol during bolus administration of intravenous contrast. Multiplanar CT image reconstructions including MIPs were obtained to evaluate the vascular anatomy.  CONTRAST:  146m OMNIPAQUE IOHEXOL 350 MG/ML SOLN  COMPARISON:  Chest  x-ray November 09, 2013, chest CT October 29, 2009  FINDINGS: There is no pulmonary embolus. The aorta is normal. There is soft tissue in the anterior mediastinum increased compared to prior exam measuring 4.2 x 2.2 cm. There is no mediastinal or hilar lymphadenopathy. The heart size is normal. There is no focal pneumonia, pleural effusion or pulmonary mass. Focal scar is identified in the right apex unchanged. The previously that noted nodule in the lateral right upper lobe is not seen today. There is chronic mild patchy opacity in the right perihilar region unchanged. There is a stable 2.1 cm nodule in the left adrenal gland unchanged. There is a small hiatal hernia. There is a 1.1 cm low density lesion in the right lobe liver unchanged compared to prior exam. The other visualized upper abdominal structures are unremarkable.  Review of the MIP images confirms the above findings.  IMPRESSION: No pulmonary embolus.  No acute abnormality within the lungs.  Abnormal soft tissue in the anterior mediastinum measuring 4.2 x 2.2 cm increase compared to prior exam, neoplasm not excluded.   Electronically Signed   By: WAbelardo DieselM.D.   On: 11/09/2013 16:52    Review  of Systems  Constitutional: Positive for weight loss. Negative for fever, chills and malaise/fatigue.  HENT: Negative.   Eyes: Negative.   Respiratory: Negative.  Negative for shortness of breath.   Cardiovascular: Positive for chest pain. Negative for orthopnea, leg swelling and PND.  Gastrointestinal: Negative.   Genitourinary: Negative.   Musculoskeletal: Negative.   Neurological: Negative.   Endo/Heme/Allergies: Negative.   Psychiatric/Behavioral: Negative.    Blood pressure 104/47, pulse 78, temperature 98.4 F (36.9 C), temperature source Oral, resp. rate 18, height 6' 1"  (1.854 m), weight 98.567 kg (217 lb 4.8 oz), SpO2 94.00%. Physical Exam  Constitutional: He is oriented to person, place, and time. He appears well-developed and  well-nourished. No distress.  HENT:  Head: Normocephalic and atraumatic.  Mouth/Throat: Oropharynx is clear and moist.  Eyes: EOM are normal. Pupils are equal, round, and reactive to light.  Neck: Normal range of motion. Neck supple. No JVD present. No tracheal deviation present. No thyromegaly present.  Cardiovascular: Normal rate, regular rhythm, normal heart sounds and intact distal pulses.  Exam reveals no gallop and no friction rub.   No murmur heard. Respiratory: Effort normal and breath sounds normal. No stridor. No respiratory distress. He has no rales. He exhibits tenderness.  GI: Soft. Bowel sounds are normal. He exhibits no distension and no mass. There is no tenderness.  Musculoskeletal: Normal range of motion. He exhibits no edema.  Lymphadenopathy:    He has no cervical adenopathy.  Neurological: He is alert and oriented to person, place, and time. He has normal strength. No cranial nerve deficit or sensory deficit.  Skin: Skin is warm and dry.     CLINICAL DATA: Chest pain for 3 weeks  EXAM:  CT ANGIOGRAPHY CHEST WITH CONTRAST  TECHNIQUE:  Multidetector CT imaging of the chest was performed using the  standard protocol during bolus administration of intravenous  contrast. Multiplanar CT image reconstructions including MIPs were  obtained to evaluate the vascular anatomy.  CONTRAST: 18m OMNIPAQUE IOHEXOL 350 MG/ML SOLN  COMPARISON: Chest x-ray November 09, 2013, chest CT October 29, 2009  FINDINGS:  There is no pulmonary embolus. The aorta is normal. There is soft  tissue in the anterior mediastinum increased compared to prior exam  measuring 4.2 x 2.2 cm. There is no mediastinal or hilar  lymphadenopathy. The heart size is normal. There is no focal  pneumonia, pleural effusion or pulmonary mass. Focal scar is  identified in the right apex unchanged. The previously that noted  nodule in the lateral right upper lobe is not seen today. There is  chronic mild patchy  opacity in the right perihilar region unchanged.  There is a stable 2.1 cm nodule in the left adrenal gland unchanged.  There is a small hiatal hernia. There is a 1.1 cm low density lesion  in the right lobe liver unchanged compared to prior exam. The other  visualized upper abdominal structures are unremarkable.  Review of the MIP images confirms the above findings.  IMPRESSION:  No pulmonary embolus. No acute abnormality within the lungs.  Abnormal soft tissue in the anterior mediastinum measuring 4.2 x 2.2  cm increase compared to prior exam, neoplasm not excluded.  Electronically Signed  By: WAbelardo DieselM.D.  On: 11/09/2013 16:52    *CBlynEChippe[place] [phone] ------------------------------------------------------------ Transthoracic Echocardiography  Patient: ODemon, VolanteMR #: [id number] Date: 11/03/2013 Gender: M Age: 89Height: 185.4cm Weight: 103.9kg BSA: 2.236m Pt. Status:  Room: E46C  ADMITTING [place], Peter Aram Beecham SONOGRAPHER Mauricio Po, RDCS, CCT ATTENDING Bensimhon, Daniel PERFORMING [facility], Inpatient cc:  ------------------------------------------------------------ LV EF: 60% - 65%  ------------------------------------------------------------ Indications: Chest pain 786.51.  ------------------------------------------------------------ History: PMH: Murmur. Coronary artery disease. Chronic obstructive pulmonary disease. Risk factors: Elevated troponin. Graves disease. Hypertension.  ------------------------------------------------------------ Study Conclusions  - Left ventricle: The cavity size was mildly dilated. Systolic function was normal. The estimated ejection fraction was in the range of 60% to 65%. Wall motion was normal; there were no regional wall motion abnormalities. - Right atrium: The atrium was mildly dilated. Transthoracic  echocardiography. M-mode, complete 2D, spectral Doppler, and color Doppler. Height: Height: 185.4cm. Height: 73in. Weight: Weight: 103.9kg. Weight: 228.5lb. Body mass index: BMI: 30.2kg/m^2. Body surface area: BSA: 2.79m2. Blood pressure: 131/68. Patient status: Inpatient. Location: Emergency department.  ------------------------------------------------------------  ------------------------------------------------------------ Left ventricle: The cavity size was mildly dilated. Systolic function was normal. The estimated ejection fraction was in the range of 60% to 65%. Wall motion was normal; there were no regional wall motion abnormalities.  ------------------------------------------------------------ Aortic valve: Trileaflet; normal thickness leaflets. Mobility was not restricted. Doppler: Transvalvular velocity was within the normal range. There was no stenosis. No regurgitation.  ------------------------------------------------------------ Aorta: Aortic root: The aortic root was normal in size.  ------------------------------------------------------------ Mitral valve: Structurally normal valve. Mobility was not restricted. Doppler: Transvalvular velocity was within the normal range. There was no evidence for stenosis. No regurgitation.  ------------------------------------------------------------ Left atrium: The atrium was normal in size.  ------------------------------------------------------------ Right ventricle: The cavity size was normal. Wall thickness was normal. Systolic function was normal.  ------------------------------------------------------------ Pulmonic valve: The valve appears to be grossly normal. Doppler: Transvalvular velocity was within the normal range. There was no evidence for stenosis.  ------------------------------------------------------------ Tricuspid valve: Structurally normal valve. Doppler: Transvalvular velocity was within the  normal range. Trivial regurgitation.  ------------------------------------------------------------ Pulmonary artery: The main pulmonary artery was normal-sized. Systolic pressure was within the normal range.  ------------------------------------------------------------ Right atrium: The atrium was mildly dilated.  ------------------------------------------------------------ Pericardium: There was no pericardial effusion.  ------------------------------------------------------------ Systemic veins: Inferior vena cava: The vessel was normal in size.  ------------------------------------------------------------  2D measurements Normal Doppler measurements Normal Left ventricle Left ventricle LVID ED, 51.7 mm 43-52 Ea, lat ann, 15.8 cm/s ------ chord, tiss DP PLAX Systemic veins LVID ES, 36.6 mm 23-38 Estimated 5 mm ------ chord, CVP Hg PLAX Right ventricle FS, chord, 29 % >29 Sa vel, lat 15.1 cm/s ------ PLAX ann, tiss DP LVPW, ED 8.83 mm ------ IVS/LVPW 1.21 <1.3 ratio, ED Ventricular septum IVS, ED 10.7 mm ------ Aorta Root diam, 31 mm ------ ED Left atrium AP dim 35 mm ------ AP dim 1.54 cm/m^2 <2.2 index Vol, S 89.3 ml ------ Vol index, 39.2 ml/m^2 ------ S  ------------------------------------------------------------ Prepared and Electronically Authenticated by  NMertie [id number]       Cardiac Cath Procedure Note:  Indication: NSTEMI  Procedures performed:  1) Selective coronary angiography  2) Left heart catheterization  3) Left ventriculogram  Description of procedure:   The risks and indication of the procedure were explained. Consent was signed and placed on the chart. An appropriate timeout was taken prior to the procedure. After a normal Allen's test was confirmed, the right wrist was prepped and draped in the routine sterile fashion and anesthetized with 1% local lidocaine.  A 5 FR arterial sheath was then placed in the  right radial artery using a modified Seldinger technique. Systemic heparin was administered. 334mIV verapamil was given through  the sheath. Standard catheters including a JL 3.5, JR4 and straight pigtail were used. All catheter exchanges were made over a wire.  Complications: None apparent  Findings:  Ao Pressure: 109/73 (90)  LV Pressure: 117/4/15  There was no signficant gradient across the aortic valve on pullback.  Left main: 20% distal stenosis.  Left Anterior Descending Artery: Large caliber vessel that courses to the apex. The ostium appears to have 20-30% stenosis. The mid vessel has diffuse 20-30% stenosis. There are two small to moderate caliber diagonal branches without focal stenosis.  Circumflex Artery: Large caliber vessel that terminates into a moderate-sized branching obtuse marginal There is mild plaque in the proximal vessel and in the marginal branch. There is a small ramus branch.  Right Coronary Artery: Moderate caliber, dominant vessel with diffuse 40-50% mid stenosis.  Left Ventricular Angiogram: LVEF 65-70%. Nu wall motion abnormalities  Assessment:  1. Mild to moderate non-obstructive CAD  2. Normal LV function  Plan/Discussion:  Suspect CP may pericarditic in nature. Tachycardia may be related to hyperthyroidism (TSH pending). Continue b-blocker. Check UDS, d-dimer and echo.  Daniel Bensimhon  5:29 PM     Assessment/Plan:  He has an enlarging anterior mediastinal mass that is clearly larger on sequential CT scans since 2011. It may be a thymoma but could also be a carcinoma. It does not appear to be invading surrounding structures and I would not think this would cause the kind of severe persistent pain that he describes. I think his pain does sound more like pericarditis although he has no pericardial effusion or thickening, no rub. I think the mediastinal mass should be resected since it is getting larger and looks completely resectable at this time. I told him that  I did not think this would resolve his pain but you never know for sure. It does not have to be done urgently so if he wants to go home and come back in later as an outpt that would be fine. If he wants to stay I will do it Monday. If he wants to go home I will have my office contact him to schedule it. This will require a partial sternotomy, possibly complete sternotomy. There is no reason to do a needle biopsy because this will usually be non-diagnostic for tumors in this location. I discussed the surgical procedure, alternatives, benefits and risks with the patient and he would like to proceed. He will think about timing and decide if he wants to proceed Monday or go home.  BARTLE,BRYAN K 11/11/2013, 8:08 AM

## 2013-11-11 NOTE — Progress Notes (Addendum)
[facility] PROGRESS NOTE  Eddy Liszewski [id number]:[id number] DOB: Aug 07, 1961 DOA: 11/09/2013 PCP: Scarlette Calico, MD  Assessment/Plan: 1. mediastinal mass, the patient had incidental finding on CT chest with IV contrast for progressing mediastinal mass patient admitted for further workup, consulted CT surgery - Dr. Cyndia Bent will do biopsy/possibly remove mass on Monday.    2. Atypical chest pain: Intermittent per patient.  This is most likely related to pericarditis, patient had cardiac cath done last week which did not show any obstructive coronary artery disease, the patient is already on aspirin, will keep him on NSAIDs as well, troponins negative.   3. Tachycardia, it appears to be sinus tachycardia, most likely related to not taking his beta blockers and his untreated hyperthyroidism, currently patient heart rate is in the low 100s to high 90s after receiving his beta blockers, patient's CT chest is negative for PE.   4. History of congestive heart failure systolic, currently appears to be compensated, patient is already on beta blockers, aspirin, ACE inhibitor.   5. COPD. continue with when necessary albuterol   6. Gastroesophageal reflux disease. Continue with PPI   7. Tobacco abuse. The patient was counseled and encouraged to stop smoking. The patient was counseled on the dangers of tobacco use, and was advised to quit. Reviewed strategies to maximize success, including removing cigarettes and smoking materials from environment and stress management.   8.  Hyperthyroidism - Pt says he last saw Dr. Dwyane Dee in July and they told him to stop taking tapazole.  He hasn't followed up since that time. I called Dr. Ronnie Derby office but there is no endocrinologist on call this weekend.   DVT Prophylaxis Heparin -   Family Communication: Admission, patients condition and plan of care including tests being ordered have been discussed with the patient who indicate understanding and agree with the plan and  Code Status.   Code Status full  Condition GUARDED  HPI/Subjective: Pt reports that central chest pain is intermittent.  Objective: Filed Vitals:   11/11/13 0500  BP: 104/47  Pulse: 78  Temp: 98.4 F (36.9 C)  Resp: 18   No intake or output data in the 24 hours ending 11/11/13 0742 Filed Weights   11/09/13 1700 11/09/13 2031  Weight: 227 lb 4.7 oz (103.1 kg) 217 lb 4.8 oz (98.567 kg)   Exam:  General: Awake, alert, no distress  Cardiovascular: normal s1, s2 sounds  Respiratory: BBS with occasional exp wheezes  Abdomen: soft, nondistended, nontender, no masses  Musculoskeletal: no CCE  Data Reviewed: Basic Metabolic Panel:  Recent Labs Lab 11/09/13 1351 11/10/13 0410  NA 138 139  K 4.4 4.6  CL 99 102  CO2 25 25  GLUCOSE 106* 104*  BUN 26* 23  CREATININE 1.01 1.04  CALCIUM 10.1 10.0   Liver Function Tests: No results found for this basename: AST, ALT, ALKPHOS, BILITOT, PROT, ALBUMIN,  in the last 168 hours No results found for this basename: LIPASE, AMYLASE,  in the last 168 hours No results found for this basename: AMMONIA,  in the last 168 hours CBC:  Recent Labs Lab 11/09/13 1351 11/10/13 0410  WBC 5.6 5.0  HGB 13.5 12.2*  HCT 39.6 36.3*  MCV 84.3 85.4  PLT 145* 159   Cardiac Enzymes:  Recent Labs Lab 11/04/13 0745 11/09/13 2122 11/10/13 0410 11/10/13 0820  TROPONINI 0.40* <0.30 <0.30 <0.30   BNP (last 3 results)  Recent Labs  11/03/13 1536 11/09/13 1351  PROBNP 89.1 167.9*   CBG: No results  found for this basename: GLUCAP,  in the last 168 hours  No results found for this or any previous visit (from the past 240 hour(s)).   Studies: Dg Chest 2 View  11/09/2013   CLINICAL DATA:  Chest pain  EXAM: CHEST  2 VIEW  COMPARISON:  11/03/2013  FINDINGS: Cardiomediastinal silhouette is stable. No acute infiltrate or pleural effusion. No pulmonary edema. Bony thorax is unremarkable.  IMPRESSION: No active cardiopulmonary disease.    Electronically Signed   By: Lahoma Crocker M.D.   On: 11/09/2013 14:33   Ct Angio Chest Pe W/cm &/or Wo Cm  11/09/2013   CLINICAL DATA:  Chest pain for 3 weeks  EXAM: CT ANGIOGRAPHY CHEST WITH CONTRAST  TECHNIQUE: Multidetector CT imaging of the chest was performed using the standard protocol during bolus administration of intravenous contrast. Multiplanar CT image reconstructions including MIPs were obtained to evaluate the vascular anatomy.  CONTRAST:  12mL OMNIPAQUE IOHEXOL 350 MG/ML SOLN  COMPARISON:  Chest x-ray November 09, 2013, chest CT October 29, 2009  FINDINGS: There is no pulmonary embolus. The aorta is normal. There is soft tissue in the anterior mediastinum increased compared to prior exam measuring 4.2 x 2.2 cm. There is no mediastinal or hilar lymphadenopathy. The heart size is normal. There is no focal pneumonia, pleural effusion or pulmonary mass. Focal scar is identified in the right apex unchanged. The previously that noted nodule in the lateral right upper lobe is not seen today. There is chronic mild patchy opacity in the right perihilar region unchanged. There is a stable 2.1 cm nodule in the left adrenal gland unchanged. There is a small hiatal hernia. There is a 1.1 cm low density lesion in the right lobe liver unchanged compared to prior exam. The other visualized upper abdominal structures are unremarkable.  Review of the MIP images confirms the above findings.  IMPRESSION: No pulmonary embolus.  No acute abnormality within the lungs.  Abnormal soft tissue in the anterior mediastinum measuring 4.2 x 2.2 cm increase compared to prior exam, neoplasm not excluded.   Electronically Signed   By: Abelardo Diesel M.D.   On: 11/09/2013 16:52    Scheduled Meds: . aspirin  325 mg Oral Daily  . atorvastatin  40 mg Oral q1800  . heparin  5,000 Units Subcutaneous Q8H  . hydrochlorothiazide  25 mg Oral Daily  . isosorbide mononitrate  30 mg Oral Daily  . losartan  100 mg Oral Daily  . metoprolol   50 mg Oral BID  . naproxen  250 mg Oral BID WC  . pantoprazole  40 mg Oral BID AC  . potassium chloride SA  40 mEq Oral Daily  . sodium chloride  3 mL Intravenous Q12H   Continuous Infusions: . sodium chloride 1,000 mL (11/10/13 0855)   Principal Problem:   Mediastinal mass Active Problems:   HYPERTENSION   COPD   Systolic CHF, chronic   GERD (gastroesophageal reflux disease)   Chest pain, atypical   Tachycardia  [facility] Pager [phone]. If 7PM-7AM, please contact night-coverage at [email or web address], password [facility] 11/11/2013, 7:42 AM  LOS: 2 days

## 2013-11-12 DIAGNOSIS — E0590 Thyrotoxicosis, unspecified without thyrotoxic crisis or storm: Secondary | ICD-10-CM

## 2013-11-12 MED ORDER — CEFUROXIME SODIUM 1.5 G IJ SOLR
1.5000 g | INTRAMUSCULAR | Status: DC
  Filled 2013-11-12: qty 1.5

## 2013-11-12 MED ORDER — DEXTROSE 5 % IV SOLN
1.5000 g | INTRAVENOUS | Status: AC
  Administered 2013-11-13: 1.5 g via INTRAVENOUS
  Filled 2013-11-12 (×3): qty 1.5

## 2013-11-12 MED ORDER — VANCOMYCIN HCL IN DEXTROSE 1-5 GM/200ML-% IV SOLN
1000.0000 mg | INTRAVENOUS | Status: AC
  Administered 2013-11-13: 1000 mg via INTRAVENOUS
  Filled 2013-11-12 (×3): qty 200

## 2013-11-12 NOTE — Progress Notes (Signed)
[facility] PROGRESS NOTE  Alejandro Reyes TGG:[id number] DOB: 07-31-1961 DOA: 11/09/2013 PCP: Scarlette Calico, MD  Assessment/Plan: 1. mediastinal mass, the patient had incidental finding on CT chest with IV contrast for progressing mediastinal mass patient admitted for further workup, consulted CT surgery - Dr. Cyndia Bent will do biopsy/possibly remove mass on Monday.   2. Atypical chest pain: Intermittent per patient. This is most likely related to pericarditis, patient had cardiac cath done last week which did not show any obstructive coronary artery disease, the patient is already on aspirin, will keep him on NSAIDs as well, troponins negative.   3. Tachycardia, it appears to be sinus tachycardia, most likely related to not taking his beta blockers and his untreated hyperthyroidism, currently patient heart rate is in the low 100s to high 90s after receiving his beta blockers, patient's CT chest is negative for PE.   4. History of congestive heart failure systolic, currently appears to be compensated, patient is already on beta blockers, aspirin, ACE inhibitor.   5. COPD. continue with when necessary albuterol   6. Gastroesophageal reflux disease. Continue with PPI   7. Tobacco abuse. The patient was counseled and encouraged to stop smoking. The patient was counseled on the dangers of tobacco use, and was advised to quit. Reviewed strategies to maximize success, including removing cigarettes and smoking materials from environment and stress management.   8. Hyperthyroidism - Pt says he last saw Dr. Dwyane Dee in July and they told him to stop taking tapazole. He hasn't followed up since that time. I called Dr. Ronnie Derby office but there is no endocrinologist on call this weekend. He was strongly advised to see Dr. Dwyane Dee soon after discharge for his followup appointment. The patient verbalized understanding.    DVT Prophylaxis Heparin -   Family Communication: Admission, patients condition and plan of  care including tests being ordered have been discussed with the patient who indicate understanding and agree with the plan and Code Status.   HPI/Subjective: Pt reports that his chest pain is controlled now.   He denies any shortness of breath.    Objective: Filed Vitals:   11/12/13 0931  BP: 126/70  Pulse: 74  Temp:   Resp:     Intake/Output Summary (Last 24 hours) at 11/12/13 1226 Last data filed at 11/12/13 0900  Gross per 24 hour  Intake   1080 ml  Output      0 ml  Net   1080 ml   Filed Weights   11/09/13 1700 11/09/13 2031  Weight: 227 lb 4.7 oz (103.1 kg) 217 lb 4.8 oz (98.567 kg)   Exam:  General: Awake, alert, no distress  Cardiovascular: normal s1, s2 sounds  Respiratory: BBS with occasional exp wheezes  Abdomen: soft, nondistended, nontender, no masses  Musculoskeletal: no CCE  Data Reviewed: Basic Metabolic Panel:  Recent Labs Lab 11/09/13 1351 11/10/13 0410  NA 138 139  K 4.4 4.6  CL 99 102  CO2 25 25  GLUCOSE 106* 104*  BUN 26* 23  CREATININE 1.01 1.04  CALCIUM 10.1 10.0   Liver Function Tests: No results found for this basename: AST, ALT, ALKPHOS, BILITOT, PROT, ALBUMIN,  in the last 168 hours No results found for this basename: LIPASE, AMYLASE,  in the last 168 hours No results found for this basename: AMMONIA,  in the last 168 hours CBC:  Recent Labs Lab 11/09/13 1351 11/10/13 0410  WBC 5.6 5.0  HGB 13.5 12.2*  HCT 39.6 36.3*  MCV 84.3 85.4  PLT 145* 159   Cardiac Enzymes:  Recent Labs Lab 11/09/13 2122 11/10/13 0410 11/10/13 0820  TROPONINI <0.30 <0.30 <0.30   BNP (last 3 results)  Recent Labs  11/03/13 1536 11/09/13 1351  PROBNP 89.1 167.9*   CBG: No results found for this basename: GLUCAP,  in the last 168 hours  Recent Results (from the past 240 hour(s))  SURGICAL PCR SCREEN     Status: None   Collection Time    11/11/13  8:17 PM      Result Value Range Status   MRSA, PCR NEGATIVE  NEGATIVE Final    Staphylococcus aureus NEGATIVE  NEGATIVE Final   Comment:            The Xpert SA Assay (FDA     approved for NASAL specimens     in patients over 71 years of age),     is one component of     a comprehensive surveillance     program.  Test performance has     been validated by [employer] for patients greater     than or equal to 46 year old.     It is not intended     to diagnose infection nor to     guide or monitor treatment.     Studies: No results found.  Scheduled Meds: . aspirin  325 mg Oral Daily  . atorvastatin  40 mg Oral q1800  . heparin  5,000 Units Subcutaneous Q8H  . hydrochlorothiazide  25 mg Oral Daily  . isosorbide mononitrate  30 mg Oral Daily  . losartan  100 mg Oral Daily  . metoprolol  50 mg Oral BID  . pantoprazole  40 mg Oral BID AC  . potassium chloride SA  40 mEq Oral Daily  . sodium chloride  3 mL Intravenous Q12H   Continuous Infusions: . sodium chloride 1,000 mL (11/10/13 0855)    Principal Problem:   Mediastinal mass Active Problems:   HYPERTENSION   COPD   Systolic CHF, chronic   GERD (gastroesophageal reflux disease)   Chest pain, atypical   Tachycardia  [facility] Pager [phone]. If 7PM-7AM, please contact night-coverage at [email or web address], password [facility] 11/12/2013, 12:26 PM  LOS: 3 days

## 2013-11-12 NOTE — Progress Notes (Signed)
Utilization Review Completed.   Lanyia Jewel, RN, BSN Nurse Case Manager  

## 2013-11-12 NOTE — Progress Notes (Signed)
Procedure(s) (LRB): STERNOTOMY for resection of anterior mediastinal tumor (N/A) Subjective:  Had some chest pain last night and received some morphine that resolved it. None today  Objective: Vital signs in last 24 hours: Temp:  [98.1 F (36.7 C)-98.5 F (36.9 C)] 98.4 F (36.9 C) (01/25 0500) Pulse Rate:  [74-85] 74 (01/25 0931) Cardiac Rhythm:  [-] Normal sinus rhythm (01/25 0917) Resp:  [18-20] 18 (01/25 0500) BP: (111-159)/(50-70) 126/70 mmHg (01/25 0931) SpO2:  [93 %-96 %] 96 % (01/25 0500)  Hemodynamic parameters for last 24 hours:    Intake/Output from previous day: 01/24 0701 - 01/25 0700 In: 1080 [P.O.:1080] Out: -  Intake/Output this shift: Total I/O In: 360 [P.O.:360] Out: -   Heart: regular rate and rhythm, S1, S2 normal, no murmur, click, rub or gallop Lungs: clear to auscultation bilaterally  Lab Results:  Recent Labs  11/09/13 1351 11/10/13 0410  WBC 5.6 5.0  HGB 13.5 12.2*  HCT 39.6 36.3*  PLT 145* 159   BMET:  Recent Labs  11/09/13 1351 11/10/13 0410  NA 138 139  K 4.4 4.6  CL 99 102  CO2 25 25  GLUCOSE 106* 104*  BUN 26* 23  CREATININE 1.01 1.04  CALCIUM 10.1 10.0    PT/INR:  Recent Labs  11/10/13 0410  LABPROT 13.8  INR 1.08   ABG    Component Value Date/Time   TCO2 25 08/31/2013 0612   CBG (last 3)  No results found for this basename: GLUCAP,  in the last 72 hours  Assessment/Plan: Anterior mediastinal mass. Plan resection via partial or full sternotomy tomorrow. Timing will depend on my first surgery. I discussed the operative procedure with the patient including alternatives, benefits, and risks including but not limited to bleeding, blood transfusion, infection, persistence of his chest pain, sternal non-union, and need for further therapy depending on the pathology. He understands and agrees to proceed. LOS: 3 days    BARTLE,BRYAN K 11/12/2013

## 2013-11-12 NOTE — Progress Notes (Signed)
Surgical teaching completed. Seen [facility] video, IS , woundcare, DBE wound splinting , coughing exercise and proper use of pain medication discussed with pt.

## 2013-11-13 ENCOUNTER — Encounter (HOSPITAL_COMMUNITY): Payer: 59 | Admitting: Certified Registered Nurse Anesthetist

## 2013-11-13 ENCOUNTER — Inpatient Hospital Stay (HOSPITAL_COMMUNITY): Payer: 59 | Admitting: Certified Registered Nurse Anesthetist

## 2013-11-13 ENCOUNTER — Inpatient Hospital Stay (HOSPITAL_COMMUNITY): Payer: 59

## 2013-11-13 ENCOUNTER — Encounter (HOSPITAL_COMMUNITY)
Admission: EM | Disposition: A | Discharge status: Home / Self Care | Payer: Self-pay | Source: Home / Self Care | Attending: Family Medicine

## 2013-11-13 DIAGNOSIS — R222 Localized swelling, mass and lump, trunk: Secondary | ICD-10-CM

## 2013-11-13 HISTORY — PX: MEDIASTERNOTOMY: SHX5084

## 2013-11-13 LAB — ABO/RH: ABO/RH(D): B POS

## 2013-11-13 LAB — GLUCOSE, CAPILLARY: Glucose-Capillary: 93 mg/dL (ref 70–99)

## 2013-11-13 SURGERY — MEDIAN STERNOTOMY
Anesthesia: General

## 2013-11-13 MED ORDER — ACETAMINOPHEN 500 MG PO TABS
1000.0000 mg | ORAL_TABLET | Freq: Four times a day (QID) | ORAL | Status: AC
  Administered 2013-11-13 – 2013-11-14 (×4): 1000 mg via ORAL
  Filled 2013-11-13 (×4): qty 2

## 2013-11-13 MED ORDER — LEVALBUTEROL HCL 0.63 MG/3ML IN NEBU: 0.6300 mg | INHALATION_SOLUTION | Freq: Four times a day (QID) | RESPIRATORY_TRACT | Status: DC

## 2013-11-13 MED ORDER — OXYCODONE HCL 5 MG PO TABS: 5.0000 mg | ORAL_TABLET | Freq: Once | ORAL | Status: DC | PRN

## 2013-11-13 MED ORDER — HYDROCODONE-ACETAMINOPHEN 5-325 MG PO TABS
ORAL_TABLET | ORAL | Status: AC
  Administered 2013-11-13: 1
  Filled 2013-11-13: qty 1

## 2013-11-13 MED ORDER — FENTANYL CITRATE 0.05 MG/ML IJ SOLN
INTRAMUSCULAR | Status: DC | PRN
  Administered 2013-11-13: 50 ug via INTRAVENOUS
  Administered 2013-11-13: 250 ug via INTRAVENOUS
  Administered 2013-11-13: 100 ug via INTRAVENOUS
  Administered 2013-11-13: 50 ug via INTRAVENOUS
  Administered 2013-11-13: 100 ug via INTRAVENOUS

## 2013-11-13 MED ORDER — LIDOCAINE HCL (CARDIAC) 20 MG/ML IV SOLN
INTRAVENOUS | Status: AC
  Filled 2013-11-13: qty 5

## 2013-11-13 MED ORDER — FENTANYL CITRATE 0.05 MG/ML IJ SOLN
INTRAMUSCULAR | Status: AC
  Filled 2013-11-13: qty 5

## 2013-11-13 MED ORDER — NEOSTIGMINE METHYLSULFATE 1 MG/ML IJ SOLN
INTRAMUSCULAR | Status: AC
  Filled 2013-11-13: qty 10

## 2013-11-13 MED ORDER — THROMBIN 20000 UNITS EX SOLR
OROMUCOSAL | Status: DC | PRN
  Administered 2013-11-13: 16:00:00 via TOPICAL

## 2013-11-13 MED ORDER — THROMBIN 20000 UNITS EX SOLR
CUTANEOUS | Status: AC
  Filled 2013-11-13: qty 20000

## 2013-11-13 MED ORDER — LACTATED RINGERS IV SOLN
INTRAVENOUS | Status: DC | PRN
  Administered 2013-11-13: 15:00:00 via INTRAVENOUS

## 2013-11-13 MED ORDER — METOPROLOL TARTRATE 1 MG/ML IV SOLN
INTRAVENOUS | Status: DC | PRN
  Administered 2013-11-13: 2 mg via INTRAVENOUS

## 2013-11-13 MED ORDER — LACTATED RINGERS IV SOLN
INTRAVENOUS | Status: DC | PRN
  Administered 2013-11-13 (×2): via INTRAVENOUS

## 2013-11-13 MED ORDER — HYDROMORPHONE HCL PF 1 MG/ML IJ SOLN
INTRAMUSCULAR | Status: AC
  Filled 2013-11-13: qty 1

## 2013-11-13 MED ORDER — PROPOFOL 10 MG/ML IV BOLUS
INTRAVENOUS | Status: AC
  Filled 2013-11-13: qty 20

## 2013-11-13 MED ORDER — GLYCOPYRROLATE 0.2 MG/ML IJ SOLN
INTRAMUSCULAR | Status: AC
  Filled 2013-11-13: qty 3

## 2013-11-13 MED ORDER — OXYCODONE-ACETAMINOPHEN 5-325 MG PO TABS
1.0000 | ORAL_TABLET | ORAL | Status: DC | PRN
  Administered 2013-11-15 – 2013-11-16 (×5): 2 via ORAL
  Filled 2013-11-13 (×6): qty 2

## 2013-11-13 MED ORDER — ALBUMIN HUMAN 5 % IV SOLN
INTRAVENOUS | Status: DC | PRN
  Administered 2013-11-13 (×2): via INTRAVENOUS

## 2013-11-13 MED ORDER — ALBUTEROL SULFATE HFA 108 (90 BASE) MCG/ACT IN AERS
INHALATION_SPRAY | RESPIRATORY_TRACT | Status: DC | PRN
  Administered 2013-11-13: 4 via RESPIRATORY_TRACT

## 2013-11-13 MED ORDER — HYDROMORPHONE HCL PF 1 MG/ML IJ SOLN
0.2500 mg | INTRAMUSCULAR | Status: DC | PRN
  Administered 2013-11-13 (×2): 0.5 mg via INTRAVENOUS

## 2013-11-13 MED ORDER — ACETAMINOPHEN 160 MG/5ML PO SOLN
1000.0000 mg | Freq: Four times a day (QID) | ORAL | Status: AC
  Filled 2013-11-13: qty 40

## 2013-11-13 MED ORDER — ROCURONIUM BROMIDE 100 MG/10ML IV SOLN
INTRAVENOUS | Status: DC | PRN
  Administered 2013-11-13: 50 mg via INTRAVENOUS
  Administered 2013-11-13: 10 mg via INTRAVENOUS
  Administered 2013-11-13: 5 mg via INTRAVENOUS

## 2013-11-13 MED ORDER — LEVALBUTEROL HCL 0.63 MG/3ML IN NEBU
0.6300 mg | INHALATION_SOLUTION | Freq: Four times a day (QID) | RESPIRATORY_TRACT | Status: DC
  Administered 2013-11-14 (×2): 0.63 mg via RESPIRATORY_TRACT
  Filled 2013-11-13 (×5): qty 3

## 2013-11-13 MED ORDER — KCL IN DEXTROSE-NACL 20-5-0.45 MEQ/L-%-% IV SOLN
INTRAVENOUS | Status: DC
  Administered 2013-11-13: 21:00:00 via INTRAVENOUS
  Filled 2013-11-13 (×2): qty 1000

## 2013-11-13 MED ORDER — 0.9 % SODIUM CHLORIDE (POUR BTL) OPTIME
TOPICAL | Status: DC | PRN
  Administered 2013-11-13: 1000 mL

## 2013-11-13 MED ORDER — MORPHINE SULFATE 4 MG/ML IJ SOLN
4.0000 mg | INTRAMUSCULAR | Status: DC | PRN
  Administered 2013-11-13 – 2013-11-14 (×2): 4 mg via INTRAVENOUS
  Filled 2013-11-13 (×2): qty 1

## 2013-11-13 MED ORDER — BISACODYL 5 MG PO TBEC
10.0000 mg | DELAYED_RELEASE_TABLET | Freq: Every day | ORAL | Status: DC
  Administered 2013-11-15: 10 mg via ORAL
  Filled 2013-11-13: qty 2

## 2013-11-13 MED ORDER — MIDAZOLAM HCL 5 MG/5ML IJ SOLN
INTRAMUSCULAR | Status: DC | PRN
  Administered 2013-11-13: 2 mg via INTRAVENOUS

## 2013-11-13 MED ORDER — LACTATED RINGERS IV SOLN
Freq: Once | INTRAVENOUS | Status: AC
  Administered 2013-11-13: 14:00:00 via INTRAVENOUS

## 2013-11-13 MED ORDER — ONDANSETRON HCL 4 MG/2ML IJ SOLN
INTRAMUSCULAR | Status: AC
  Filled 2013-11-13: qty 2

## 2013-11-13 MED ORDER — DEXTROSE 5 % IV SOLN
1.5000 g | Freq: Two times a day (BID) | INTRAVENOUS | Status: AC
  Administered 2013-11-14 – 2013-11-15 (×2): 1.5 g via INTRAVENOUS
  Filled 2013-11-13 (×2): qty 1.5

## 2013-11-13 MED ORDER — ALBUTEROL SULFATE HFA 108 (90 BASE) MCG/ACT IN AERS
INHALATION_SPRAY | RESPIRATORY_TRACT | Status: AC
  Filled 2013-11-13: qty 6.7

## 2013-11-13 MED ORDER — MIDAZOLAM HCL 2 MG/2ML IJ SOLN
INTRAMUSCULAR | Status: AC
  Filled 2013-11-13: qty 2

## 2013-11-13 MED ORDER — MIDAZOLAM HCL 2 MG/2ML IJ SOLN
INTRAMUSCULAR | Status: AC
Start: 2013-11-13 — End: 2013-11-14
  Filled 2013-11-13: qty 2

## 2013-11-13 MED ORDER — OXYCODONE HCL 5 MG PO TABS
5.0000 mg | ORAL_TABLET | ORAL | Status: AC | PRN
  Administered 2013-11-13 – 2013-11-14 (×4): 10 mg via ORAL
  Administered 2013-11-14: 5 mg via ORAL
  Filled 2013-11-13 (×5): qty 2

## 2013-11-13 MED ORDER — SENNOSIDES-DOCUSATE SODIUM 8.6-50 MG PO TABS
1.0000 | ORAL_TABLET | Freq: Every evening | ORAL | Status: DC | PRN
  Filled 2013-11-13: qty 1

## 2013-11-13 MED ORDER — ONDANSETRON HCL 4 MG/2ML IJ SOLN
INTRAMUSCULAR | Status: DC | PRN
  Administered 2013-11-13: 4 mg via INTRAVENOUS

## 2013-11-13 MED ORDER — THROMBIN 20000 UNITS EX SOLR
OROMUCOSAL | Status: DC | PRN
  Administered 2013-11-13 (×2): via TOPICAL

## 2013-11-13 MED ORDER — SODIUM CHLORIDE 0.9 % IV SOLN
10.0000 mg | INTRAVENOUS | Status: DC | PRN
  Administered 2013-11-13: 5 ug/min via INTRAVENOUS

## 2013-11-13 MED ORDER — INSULIN ASPART 100 UNIT/ML ~~LOC~~ SOLN: 0.0000 [IU] | SUBCUTANEOUS | Status: DC

## 2013-11-13 MED ORDER — ROCURONIUM BROMIDE 50 MG/5ML IV SOLN
INTRAVENOUS | Status: AC
  Filled 2013-11-13: qty 1

## 2013-11-13 MED ORDER — ONDANSETRON HCL 4 MG/2ML IJ SOLN: 4.0000 mg | Freq: Four times a day (QID) | INTRAMUSCULAR | Status: DC | PRN

## 2013-11-13 MED ORDER — NEOSTIGMINE METHYLSULFATE 1 MG/ML IJ SOLN
INTRAMUSCULAR | Status: DC | PRN
  Administered 2013-11-13: 4 mg via INTRAVENOUS

## 2013-11-13 MED ORDER — HYDROMORPHONE HCL PF 1 MG/ML IJ SOLN
0.2500 mg | INTRAMUSCULAR | Status: DC | PRN
  Administered 2013-11-13 (×4): 0.5 mg via INTRAVENOUS

## 2013-11-13 MED ORDER — GLYCOPYRROLATE 0.2 MG/ML IJ SOLN
INTRAMUSCULAR | Status: DC | PRN
  Administered 2013-11-13: .6 mg via INTRAVENOUS

## 2013-11-13 MED ORDER — KETOROLAC TROMETHAMINE 15 MG/ML IJ SOLN
15.0000 mg | Freq: Four times a day (QID) | INTRAMUSCULAR | Status: DC | PRN
  Administered 2013-11-13 – 2013-11-15 (×6): 15 mg via INTRAVENOUS
  Filled 2013-11-13 (×6): qty 1

## 2013-11-13 MED ORDER — POTASSIUM CHLORIDE 10 MEQ/50ML IV SOLN
10.0000 meq | Freq: Every day | INTRAVENOUS | Status: DC | PRN
  Filled 2013-11-13: qty 50

## 2013-11-13 MED ORDER — PROPOFOL 10 MG/ML IV BOLUS
INTRAVENOUS | Status: DC | PRN
  Administered 2013-11-13: 180 mg via INTRAVENOUS

## 2013-11-13 MED ORDER — OXYCODONE HCL 5 MG/5ML PO SOLN: 5.0000 mg | Freq: Once | ORAL | Status: DC | PRN

## 2013-11-13 MED ORDER — FENTANYL CITRATE 0.05 MG/ML IJ SOLN
INTRAMUSCULAR | Status: AC
  Filled 2013-11-13: qty 2

## 2013-11-13 MED ORDER — ONDANSETRON HCL 4 MG/2ML IJ SOLN: 4.0000 mg | Freq: Once | INTRAMUSCULAR | Status: DC | PRN

## 2013-11-13 SURGICAL SUPPLY — 41 items
BLADE SURG 11 STRL SS (BLADE) IMPLANT
CANISTER SUCTION 2500CC (MISCELLANEOUS) ×3 IMPLANT
CATH THORACIC 28FR (CATHETERS) IMPLANT
CATH THORACIC 28FR RT ANG (CATHETERS) IMPLANT
CATH THORACIC 36FR (CATHETERS) IMPLANT
CATH THORACIC 36FR RT ANG (CATHETERS) IMPLANT
CLIP TI MEDIUM 24 (CLIP) ×3 IMPLANT
CLIP TI WIDE RED SMALL 24 (CLIP) ×6 IMPLANT
CONT SPEC 4OZ CLIKSEAL STRL BL (MISCELLANEOUS) ×3 IMPLANT
COVER SURGICAL LIGHT HANDLE (MISCELLANEOUS) ×6 IMPLANT
DERMABOND ADVANCED (GAUZE/BANDAGES/DRESSINGS) ×2
DERMABOND ADVANCED .7 DNX12 (GAUZE/BANDAGES/DRESSINGS) ×1 IMPLANT
DRAPE LAPAROSCOPIC ABDOMINAL (DRAPES) ×3 IMPLANT
ELECT REM PT RETURN 9FT ADLT (ELECTROSURGICAL) ×3
ELECTRODE REM PT RTRN 9FT ADLT (ELECTROSURGICAL) ×1 IMPLANT
GOWN PREVENTION PLUS XLARGE (GOWN DISPOSABLE) ×3 IMPLANT
GOWN STRL NON-REIN LRG LVL3 (GOWN DISPOSABLE) ×9 IMPLANT
HEMOSTAT POWDER SURGIFOAM 1G (HEMOSTASIS) ×6 IMPLANT
KIT BASIN OR (CUSTOM PROCEDURE TRAY) ×3 IMPLANT
KIT ROOM TURNOVER OR (KITS) ×3 IMPLANT
KIT SUCTION CATH 14FR (SUCTIONS) IMPLANT
NS IRRIG 1000ML POUR BTL (IV SOLUTION) ×6 IMPLANT
PACK CHEST (CUSTOM PROCEDURE TRAY) ×3 IMPLANT
PAD ARMBOARD 7.5X6 YLW CONV (MISCELLANEOUS) ×6 IMPLANT
SPONGE GAUZE 4X4 12PLY (GAUZE/BANDAGES/DRESSINGS) ×3 IMPLANT
SPONGE GAUZE 4X4 12PLY STER LF (GAUZE/BANDAGES/DRESSINGS) ×3 IMPLANT
SUT SILK  1 MH (SUTURE) ×4
SUT SILK 1 MH (SUTURE) ×2 IMPLANT
SUT SILK 2 0 SH CR/8 (SUTURE) ×3 IMPLANT
SUT STEEL 6MS V (SUTURE) ×3 IMPLANT
SUT STEEL STERNAL CCS#1 18IN (SUTURE) ×6 IMPLANT
SUT STEEL SZ 6 DBL 3X14 BALL (SUTURE) IMPLANT
SUT VIC AB 1 CTX 27 (SUTURE) ×6 IMPLANT
SUT VIC AB 2-0 CTX 36 (SUTURE) ×6 IMPLANT
SUT VIC AB 3-0 X1 27 (SUTURE) ×6 IMPLANT
SYSTEM SAHARA CHEST DRAIN RE-I (WOUND CARE) ×3 IMPLANT
TAPE CLOTH SURG 4X10 WHT LF (GAUZE/BANDAGES/DRESSINGS) ×3 IMPLANT
TOWEL OR 17X24 6PK STRL BLUE (TOWEL DISPOSABLE) ×3 IMPLANT
TOWEL OR 17X26 10 PK STRL BLUE (TOWEL DISPOSABLE) ×6 IMPLANT
TRAY FOLEY CATH 14FRSI W/METER (CATHETERS) ×3 IMPLANT
WATER STERILE IRR 1000ML POUR (IV SOLUTION) ×3 IMPLANT

## 2013-11-13 NOTE — Brief Op Note (Signed)
11/09/2013 - 11/13/2013  5:37 PM  PATIENT:  Alejandro Reyes  53 y.o. male  PRE-OPERATIVE DIAGNOSIS:  Anterior Mediastinal Mass  POST-OPERATIVE DIAGNOSIS:  Anterior Mediastinal Mass  PROCEDURE:  Procedure(s): MEDIAN STERNOTOMY (N/A) Resection of anterior mediastinal tumor (N/A)  SURGEON:  Surgeon(s) and Role:    * Gaye Pollack, MD - Primary  PHYSICIAN ASSISTANT: Erin Barrett, PA-C  ASSISTANTS: same   ANESTHESIA:   general  EBL:  Total I/O In: 500 [IV Piggyback:500] Out: 330 [Urine:230; Blood:100]  BLOOD ADMINISTERED:none  DRAINS: (1 45 F) Blake drain(s) in the anterior mediastinum   LOCAL MEDICATIONS USED:  NONE  SPECIMEN:  Source of Specimen:  anterior mediastinal mass, clinically hyperplastic thymus  DISPOSITION OF SPECIMEN:  PATHOLOGY  COUNTS:  YES   DICTATION: .Note written in EPIC  PLAN OF CARE: Admit to inpatient   PATIENT DISPOSITION:  PACU - hemodynamically stable.   Delay start of Pharmacological VTE agent (>24hrs) due to surgical blood loss or risk of bleeding: yes

## 2013-11-13 NOTE — Progress Notes (Signed)
PHYSICIAN PROGRESS NOTE  Alejandro Reyes AOZ:[id number] DOB: 1961-05-27 DOA: 11/09/2013  11/13/2013   PCP: Scarlette Calico, MD   Assessment/Plan: 1. Mediastinal mass, the patient had incidental finding on CT chest with IV contrast for progressing mediastinal mass patient admitted for further workup, consulted CT surgery - Dr. Cyndia Bent will do biopsy/possibly remove mass today.    2. Atypical chest pain: Intermittent per patient. This is most likely related to pericarditis, patient had cardiac cath done last week which did not show any obstructive coronary artery disease, the patient is already on aspirin, will keep him on NSAIDs as well, troponins negative.   3. Tachycardia, it appears to be sinus tachycardia, most likely related to not taking his beta blockers and his untreated hyperthyroidism, currently patient heart rate is in the low 100s to high 90s after receiving his beta blockers, patient's CT chest is negative for PE.   4. History of congestive heart failure systolic, currently appears to be compensated, patient is already on beta blockers, aspirin, ACE inhibitor.   5. COPD. continue with when necessary albuterol   6. Gastroesophageal reflux disease. Continue with PPI   7. Tobacco abuse. The patient was counseled and encouraged to stop smoking. The patient was counseled on the dangers of tobacco use, and was advised to quit. Reviewed strategies to maximize success, including removing cigarettes and smoking materials from environment and stress management.   8. Hyperthyroidism - Pt says he last saw Dr. Dwyane Dee in July and they told him to stop taking tapazole. He hasn't followed up since that time. I called Dr. Ronnie Derby office but there is no endocrinologist on call this weekend. He was strongly advised to see Dr. Dwyane Dee soon after discharge for his followup appointment. The patient verbalized understanding.   DVT Prophylaxis Heparin -   Family Communication: Admission, patients condition  and plan of care including tests being ordered have been discussed with the patient who indicate understanding and agree with the plan and Code Status.   HPI/Subjective: Pt without complaints.  For surgery this morning.  No CP reported.   Objective: Filed Vitals:   11/13/13 0500  BP: 107/51  Pulse: 73  Temp: 98.4 F (36.9 C)  Resp: 16    Intake/Output Summary (Last 24 hours) at 11/13/13 0716 Last data filed at 11/12/13 1800  Gross per 24 hour  Intake   1200 ml  Output      0 ml  Net   1200 ml   Filed Weights   11/09/13 1700 11/09/13 2031  Weight: 227 lb 4.7 oz (103.1 kg) 217 lb 4.8 oz (98.567 kg)   Exam:  General: Awake, alert, no distress  Cardiovascular: normal s1, s2 sounds  Respiratory: BBS with occasional exp wheezes  Abdomen: soft, nondistended, nontender, no masses  Musculoskeletal: no CCE  Data Reviewed: Basic Metabolic Panel:  Recent Labs Lab 11/09/13 1351 11/10/13 0410  NA 138 139  K 4.4 4.6  CL 99 102  CO2 25 25  GLUCOSE 106* 104*  BUN 26* 23  CREATININE 1.01 1.04  CALCIUM 10.1 10.0   Liver Function Tests: No results found for this basename: AST, ALT, ALKPHOS, BILITOT, PROT, ALBUMIN,  in the last 168 hours No results found for this basename: LIPASE, AMYLASE,  in the last 168 hours No results found for this basename: AMMONIA,  in the last 168 hours CBC:  Recent Labs Lab 11/09/13 1351 11/10/13 0410  WBC 5.6 5.0  HGB 13.5 12.2*  HCT 39.6 36.3*  MCV 84.3 85.4  PLT 145* 159   Cardiac Enzymes:  Recent Labs Lab 11/09/13 2122 11/10/13 0410 11/10/13 0820  TROPONINI <0.30 <0.30 <0.30   BNP (last 3 results)  Recent Labs  11/03/13 1536 11/09/13 1351  PROBNP 89.1 167.9*   CBG: No results found for this basename: GLUCAP,  in the last 168 hours  Recent Results (from the past 240 hour(s))  SURGICAL PCR SCREEN     Status: None   Collection Time    11/11/13  8:17 PM      Result Value Range Status   MRSA, PCR NEGATIVE  NEGATIVE Final     Staphylococcus aureus NEGATIVE  NEGATIVE Final   Comment:            The Xpert SA Assay (FDA     approved for NASAL specimens     in patients over 56 years of age),     is one component of     a comprehensive surveillance     program.  Test performance has     been validated by [employer] for patients greater     than or equal to 66 year old.     It is not intended     to diagnose infection nor to     guide or monitor treatment.     Studies: No results found.  Scheduled Meds: . aspirin  325 mg Oral Daily  . atorvastatin  40 mg Oral q1800  . cefUROXime (ZINACEF)  IV  1.5 g Intravenous On Call to OR  . hydrochlorothiazide  25 mg Oral Daily  . isosorbide mononitrate  30 mg Oral Daily  . losartan  100 mg Oral Daily  . metoprolol  50 mg Oral BID  . pantoprazole  40 mg Oral BID AC  . potassium chloride SA  40 mEq Oral Daily  . sodium chloride  3 mL Intravenous Q12H  . vancomycin  1,000 mg Intravenous On Call to OR   Continuous Infusions: . sodium chloride 1,000 mL (11/10/13 0855)   Principal Problem:   Mediastinal mass Active Problems:   HYPERTENSION   COPD   Systolic CHF, chronic   GERD (gastroesophageal reflux disease)   Chest pain, atypical   Tachycardia  [facility] Pager [phone]. If 7PM-7AM, please contact night-coverage at [email or web address], password [facility] 11/13/2013, 7:16 AM  LOS: 4 days

## 2013-11-13 NOTE — Anesthesia Preprocedure Evaluation (Signed)
Anesthesia Evaluation  Patient identified by MRN, date of birth, ID band Patient awake    Reviewed: Allergy & Precautions, H&P , NPO status , Patient's Chart, lab work & pertinent test results  Airway Mallampati: II      Dental  (+) Teeth Intact and Dental Advisory Given   Pulmonary Current Smoker,  breath sounds clear to auscultation        Cardiovascular hypertension, Rhythm:Regular Rate:Normal     Neuro/Psych    GI/Hepatic   Endo/Other    Renal/GU      Musculoskeletal   Abdominal   Peds  Hematology   Anesthesia Other Findings   Reproductive/Obstetrics                           Anesthesia Physical Anesthesia Plan  ASA: III  Anesthesia Plan: General   Post-op Pain Management:    Induction: Intravenous  Airway Management Planned: Oral ETT  Additional Equipment: CVP and Arterial line  Intra-op Plan:   Post-operative Plan: Extubation in OR  Informed Consent: I have reviewed the patients History and Physical, chart, labs and discussed the procedure including the risks, benefits and alternatives for the proposed anesthesia with the patient or authorized representative who has indicated his/her understanding and acceptance.   Dental advisory given  Plan Discussed with: CRNA, Anesthesiologist and Surgeon  Anesthesia Plan Comments:         Anesthesia Quick Evaluation

## 2013-11-13 NOTE — Anesthesia Postprocedure Evaluation (Signed)
  Anesthesia Post-op Note  Patient: Alejandro Reyes  Procedure(s) Performed: Procedure(s): MEDIAN STERNOTOMY (N/A) Resection of anterior mediastinal tumor (N/A)  Patient Location: PACU  Anesthesia Type:General  Level of Consciousness: awake, alert  and oriented  Airway and Oxygen Therapy: Patient Spontanous Breathing and Patient connected to nasal cannula oxygen  Post-op Pain: mild  Post-op Assessment: Post-op Vital signs reviewed, Patient's Cardiovascular Status Stable, Respiratory Function Stable, Patent Airway, No signs of Nausea or vomiting and Pain level controlled  Post-op Vital Signs: stable  Complications: No apparent anesthesia complications

## 2013-11-13 NOTE — Anesthesia Procedure Notes (Signed)
Procedure Name: Intubation Date/Time: 11/13/2013 3:28 PM Performed by: Octavio Graves Pre-anesthesia Checklist: Patient identified, Timeout performed, Emergency Drugs available, Suction available and Patient being monitored Patient Re-evaluated:Patient Re-evaluated prior to inductionOxygen Delivery Method: Circle system utilized Preoxygenation: Pre-oxygenation with 100% oxygen Intubation Type: IV induction Ventilation: Mask ventilation without difficulty Laryngoscope Size: Miller and 2 Grade View: Grade I Tube type: Oral Tube size: 7.5 mm Number of attempts: 3 Airway Equipment and Method: Stylet and Video-laryngoscopy Placement Confirmation: ETT inserted through vocal cords under direct vision,  breath sounds checked- equal and bilateral and positive ETCO2 Secured at: 22 cm Tube secured with: Tape Dental Injury: Teeth and Oropharynx as per pre-operative assessment  Difficulty Due To: Difficult Airway- due to anterior larynx Comments: Iv induction [place] attempted intubation with MAC 3 poor view- then MAC 4 poor view- Dr Linna Caprice assisting and supervising- Joslin laryngoscopy with Sabra Heck 2 poor view- Joslin Glidescope- good view 7.5 ETT advanced thru cords- + ETCO2 + BS Bilat- "tight" chest per Joslin- Albuterol 4 puffs via ETT by Linna Caprice-  All attempts atraumatic- Pt with missing front tooth in addition to other teeth missing prior to laryngoscopies- Pt complained of loose teeth present in back prior to the intubation- very poor dentition

## 2013-11-13 NOTE — Preoperative (Signed)
Beta Blockers   Reason not to administer Beta Blockers:Not Applicable 

## 2013-11-13 NOTE — Care Management Note (Unsigned)
    Page 1 of 1   11/13/2013     3:31:44 PM   CARE MANAGEMENT NOTE 11/13/2013  Patient:  Alejandro Reyes, Alejandro Reyes   Account Number:  [id number]  Date Initiated:  11/13/2013  Documentation initiated by:  GRAVES-BIGELOW,Bethany Hirt  Subjective/Objective Assessment:   Pt admitted for cp. CT revealed anterior Mediastinal mass. Plan for  Plan resection via partial or full sternotomy 11-13-13.     Action/Plan:   CM to continue to monitor for disposition needs.   Anticipated DC Date:  11/16/2013   Anticipated DC Plan:  [employer]  CM consult      Choice offered to / List presented to:             Status of service:  In process, will continue to follow Medicare Important Message given?   (If response is "NO", the following Medicare IM given date fields will be blank) Date Medicare IM given:   Date Additional Medicare IM given:    Discharge Disposition:    Per UR Regulation:  Reviewed for med. necessity/level of care/duration of stay  If discussed at [place] of Stay Meetings, dates discussed:    Comments:

## 2013-11-13 NOTE — Transfer of Care (Signed)
Immediate Anesthesia Transfer of Care Note  Patient: Alejandro Reyes  Procedure(s) Performed: Procedure(s): MEDIAN STERNOTOMY (N/A) Resection of anterior mediastinal tumor (N/A)  Patient Location: PACU  Anesthesia Type:General  Level of Consciousness: awake and alert   Airway & Oxygen Therapy: Patient Spontanous Breathing and Patient connected to nasal cannula oxygen  Post-op Assessment: Report given to PACU RN and Post -op Vital signs reviewed and stable  Post vital signs: Reviewed and stable  Complications: No apparent anesthesia complications

## 2013-11-14 ENCOUNTER — Inpatient Hospital Stay (HOSPITAL_COMMUNITY): Payer: 59

## 2013-11-14 ENCOUNTER — Encounter (HOSPITAL_COMMUNITY): Payer: Self-pay | Admitting: Surgery

## 2013-11-14 LAB — CBC
HCT: 34.2 % — ABNORMAL LOW (ref 39.0–52.0)
HEMOGLOBIN: 11.5 g/dL — AB (ref 13.0–17.0)
MCH: 28.7 pg (ref 26.0–34.0)
MCHC: 33.6 g/dL (ref 30.0–36.0)
MCV: 85.3 fL (ref 78.0–100.0)
Platelets: 152 10*3/uL (ref 150–400)
RBC: 4.01 MIL/uL — AB (ref 4.22–5.81)
RDW: 12.6 % (ref 11.5–15.5)
WBC: 8.7 10*3/uL (ref 4.0–10.5)

## 2013-11-14 LAB — GLUCOSE, CAPILLARY
GLUCOSE-CAPILLARY: 99 mg/dL (ref 70–99)
GLUCOSE-CAPILLARY: 99 mg/dL (ref 70–99)
Glucose-Capillary: 108 mg/dL — ABNORMAL HIGH (ref 70–99)

## 2013-11-14 LAB — BASIC METABOLIC PANEL
BUN: 26 mg/dL — ABNORMAL HIGH (ref 6–23)
CO2: 24 meq/L (ref 19–32)
Calcium: 9.3 mg/dL (ref 8.4–10.5)
Chloride: 101 mEq/L (ref 96–112)
Creatinine, Ser: 1.19 mg/dL (ref 0.50–1.35)
GFR calc Af Amer: 78 mL/min — ABNORMAL LOW (ref >90)
GFR calc non Af Amer: 68 mL/min — ABNORMAL LOW (ref >90)
Glucose, Bld: 107 mg/dL — ABNORMAL HIGH (ref 70–99)
POTASSIUM: 4.5 meq/L (ref 3.7–5.3)
SODIUM: 138 meq/L (ref 137–147)

## 2013-11-14 MED ORDER — MORPHINE SULFATE 2 MG/ML IJ SOLN: 2.0000 mg | INTRAMUSCULAR | Status: DC | PRN

## 2013-11-14 NOTE — Progress Notes (Signed)
PHYSICIAN PROGRESS NOTE  Alejandro Reyes ZMO:[id number] DOB: April 04, 1961 DOA: 11/09/2013  11/14/2013   PCP: Scarlette Calico, MD  Assessment/Plan: 1. 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 Cyndia Bent performed surgery.   2. Atypical chest pain: Intermittent per patient. This is most likely related to pericarditis, patient had cardiac cath done last week which did not show any obstructive coronary artery disease, the patient is already on aspirin, will keep him on NSAIDs as well, troponins negative.   3. Tachycardia, it appears to be sinus tachycardia, related to postop state and his untreated hyperthyroidism, currently patient heart rate is in the low 100s to 110s after receiving his beta blockers, patient's CT chest was negative for PE.   4. History of congestive heart failure systolic, currently appears to be compensated, patient is already on beta blockers, aspirin, ACE inhibitor.   5. COPD. continue with when necessary albuterol   6. Gastroesophageal reflux disease. Continue with PPI   7. Tobacco abuse. The patient was counseled and encouraged to stop smoking. The patient was counseled on the dangers of tobacco use, and was advised to quit. Reviewed strategies to maximize success, including removing cigarettes and smoking materials from environment and stress management.   8. Hyperthyroidism - Pt says he last saw Dr. Dwyane Dee in July and reports they told him to stop taking tapazole. He hasn't followed up since that time. I called Dr. Ronnie Derby office and pt was strongly advised to see Dr. Dwyane Dee soon after discharge for his followup appointment. The patient verbalized understanding.   DVT Prophylaxis Heparin -  Family Communication: No family at bedside Disposition Plan: per surgery   HPI/Subjective: Pt  reports that his pain is controlled  Objective: Filed Vitals:   11/14/13 0600  BP: 96/46  Pulse: 99  Temp:   Resp: 21    Intake/Output Summary (Last 24 hours) at 11/14/13 0648 Last data filed at 11/14/13 0600  Gross per 24 hour  Intake   4265 ml  Output   1325 ml  Net   2940 ml   Filed Weights   11/09/13 1700 11/09/13 2031 11/14/13 0600  Weight: 227 lb 4.7 oz (103.1 kg) 217 lb 4.8 oz (98.567 kg) 222 lb 0.1 oz (100.7 kg)    Exam:  General: Awake, alert, no distress  Cardiovascular: normal s1, s2 sounds tachycardic Respiratory: BBS shallow  Abdomen: soft, nondistended, nontender, no masses  Musculoskeletal: no CCE  Data Reviewed: Basic Metabolic Panel:  Recent Labs Lab 11/09/13 1351 11/10/13 0410 11/14/13 0400  NA 138 139 138  K 4.4 4.6 4.5  CL 99 102 101  CO2 25 25 24   GLUCOSE 106* 104* 107*  BUN 26* 23 26*  CREATININE 1.01 1.04 1.19  CALCIUM 10.1 10.0 9.3   Liver Function Tests: No results found for this basename: AST, ALT, ALKPHOS, BILITOT, PROT, ALBUMIN,  in the last 168 hours No results found for this basename: LIPASE, AMYLASE,  in the last 168 hours No results found for this basename: AMMONIA,  in the last 168 hours CBC:  Recent Labs Lab 11/09/13 1351 11/10/13 0410 11/14/13 0400  WBC 5.6 5.0 8.7  HGB 13.5 12.2* 11.5*  HCT 39.6 36.3* 34.2*  MCV 84.3 85.4 85.3  PLT 145* 159 152   Cardiac Enzymes:  Recent Labs Lab 11/09/13 2122 11/10/13 0410 11/10/13 0820  TROPONINI <0.30 <0.30 <0.30   BNP (last 3 results)  Recent Labs  11/03/13 1536 11/09/13 1351  PROBNP 89.1 167.9*   CBG:  Recent Labs Lab 11/13/13 2053 11/14/13 0016 11/14/13 0352  GLUCAP 93 99 99    Recent Results (from the past 240 hour(s))  SURGICAL PCR SCREEN     Status: None   Collection Time    11/11/13  8:17 PM      Result Value Range Status   MRSA, PCR NEGATIVE  NEGATIVE Final   Staphylococcus aureus NEGATIVE  NEGATIVE Final   Comment:            The Xpert  SA Assay (FDA     approved for NASAL specimens     in patients over 73 years of age),     is one component of     a comprehensive surveillance     program.  Test performance has     been validated by [employer] for patients greater     than or equal to 42 year old.     It is not intended     to diagnose infection nor to     guide or monitor treatment.     Studies: Dg Chest Portable 1 View  11/13/2013   CLINICAL DATA:  Prior chest surgery.  EXAM: PORTABLE CHEST - 1 VIEW  COMPARISON:  CT ANGIO CHEST W/CM &/OR WO/CM dated 11/09/2013; DG CHEST 2 VIEW dated 11/09/2013  FINDINGS: Right IJ tube in good anatomic position. Mediastinal drainage catheter is noted. Prior median sternotomy. Mild cardiomegaly, pulmonary vascularity is normal. No focal infiltrate.  IMPRESSION: 1. Right IJ line and mediastinal drains catheter noted. Prior median sternotomy. 2. Mild cardiomegaly.  No CHF.   Electronically Signed   By: Marcello Moores  Register   On: 11/13/2013 20:44    Scheduled Meds: . acetaminophen  1,000 mg Oral Q6H   Or  . acetaminophen (TYLENOL) oral liquid 160 mg/5 mL  1,000 mg Oral Q6H  . aspirin  325 mg Oral Daily  . atorvastatin  40 mg Oral q1800  . bisacodyl  10 mg Oral Daily  . cefUROXime (ZINACEF)  IV  1.5 g Intravenous Q12H  . HYDROmorphone      . insulin aspart  0-24 Units Subcutaneous Q4H  . isosorbide mononitrate  30 mg Oral Daily  . levalbuterol  0.63 mg Nebulization Q6H  . losartan  100 mg Oral Daily  . metoprolol  50 mg Oral BID  . pantoprazole  40 mg Oral BID AC   Continuous Infusions: . dextrose 5 % and 0.45 % NaCl with KCl 20 mEq/L 75 mL/hr at 11/13/13 2100    Principal Problem:   Mediastinal mass Active Problems:   HYPERTENSION   COPD   Systolic CHF, chronic   GERD (gastroesophageal reflux disease)   Chest pain, atypical   Tachycardia   Nayeli Calvert [facility] Pager [phone]. If 7PM-7AM, please contact night-coverage at [email or web address], password  [facility] 11/14/2013, 6:48 AM  LOS: 5 days

## 2013-11-14 NOTE — Progress Notes (Signed)
Came to visit patient at bedside on behalf of Link to [employer] program for [employer] employees/dependents with [facility]. He had just transferred from the unit. Spoke briefly about Link to [employer]. Left packet and brochure at bedside. Will follow up at later date. Made inpatient RNCM aware of bedside visit.  Marthenia Rolling, MSN, [facility] [phone]

## 2013-11-14 NOTE — Progress Notes (Signed)
Transferred to 2W12 via wheelchair with monitor and O2 at 3L/min [place]. Tolerated well. Park and walk completed.

## 2013-11-14 NOTE — Progress Notes (Signed)
1 Day Post-Op Procedure(s) (LRB): MEDIAN STERNOTOMY (N/A) Resection of anterior mediastinal tumor (N/A) Subjective: Complain of pain  Objective: Vital signs in last 24 hours: Temp:  [98.2 F (36.8 C)-100.2 F (37.9 C)] 99 F (37.2 C) (01/27 0738) Pulse Rate:  [92-117] 104 (01/27 0700) Cardiac Rhythm:  [-] Sinus tachycardia (01/26 1940) Resp:  [10-28] 23 (01/27 0700) BP: (85-122)/(31-73) 122/47 mmHg (01/27 0700) SpO2:  [95 %-100 %] 97 % (01/27 0700) Arterial Line BP: (89-154)/(43-72) 112/52 mmHg (01/27 0700) Weight:  [100.7 kg (222 lb 0.1 oz)] 100.7 kg (222 lb 0.1 oz) (01/27 0600)  Hemodynamic parameters for last 24 hours:    Intake/Output from previous day: 01/26 0701 - 01/27 0700 In: 4340 [P.O.:290; I.V.:3550; IV Piggyback:500] Out: 1325 [Urine:1025; Blood:150; Chest Tube:150] Intake/Output this shift:    General appearance: alert and cooperative Heart: regular rate and rhythm, S1, S2 normal, no murmur, click, rub or gallop Lungs: clear to auscultation bilaterally Wound: dressing dry chest tube output low.  Lab Results:  Recent Labs  11/14/13 0400  WBC 8.7  HGB 11.5*  HCT 34.2*  PLT 152   BMET:  Recent Labs  11/14/13 0400  NA 138  K 4.5  CL 101  CO2 24  GLUCOSE 107*  BUN 26*  CREATININE 1.19  CALCIUM 9.3    PT/INR: No results found for this basename: LABPROT, INR,  in the last 72 hours ABG    Component Value Date/Time   TCO2 25 08/31/2013 0612   CBG (last 3)   Recent Labs  11/13/13 2053 11/14/13 0016 11/14/13 0352  GLUCAP 93 99 99    Assessment/Plan: S/P Procedure(s) (LRB): MEDIAN STERNOTOMY (N/A) Resection of anterior mediastinal tumor (N/A) Hemodynamically stable Remove chest tube, A-line, foley Transfer to 2W and mobilize. Pain control    LOS: 5 days    BARTLE,BRYAN K 11/14/2013

## 2013-11-15 ENCOUNTER — Inpatient Hospital Stay (HOSPITAL_COMMUNITY): Payer: 59

## 2013-11-15 NOTE — Progress Notes (Signed)
       [address]       [place] [address]             [phone]          2 Days Post-Op Procedure(s) (LRB): MEDIAN STERNOTOMY (N/A) Resection of anterior mediastinal tumor (N/A)  Subjective: Having a lot of pain.  Just back from x-ray.  Tm 100.2 this am.     Objective: Vital signs in last 24 hours: Patient Vitals for the past 24 hrs:  BP Temp Temp src Pulse Resp SpO2  11/15/13 0513 122/77 mmHg 100.2 F (37.9 C) Oral 123 18 92 %  11/14/13 2006 130/50 mmHg 99.7 F (37.6 C) Oral 112 18 100 %  11/14/13 1456 111/58 mmHg 98.8 F (37.1 C) Oral 93 18 100 %  11/14/13 1400 121/69 mmHg - - 97 18 100 %  11/14/13 1300 108/60 mmHg - - 91 21 98 %  11/14/13 1220 - 98 F (36.7 C) Oral - - -  11/14/13 1200 105/60 mmHg - - 95 17 98 %  11/14/13 1100 123/67 mmHg - - 104 19 96 %  11/14/13 1000 122/69 mmHg - - 113 21 97 %  11/14/13 0900 123/45 mmHg - - 115 29 97 %   Current Weight  11/14/13 222 lb 0.1 oz (100.7 kg)     Intake/Output from previous day: 01/27 0701 - 01/28 0700 In: 605 [P.O.:480; I.V.:75; IV Piggyback:50] Out: 852 [Urine:705; Chest Tube:50]    PHYSICAL EXAM:  Heart: RRR Lungs: Coarse bilateral BS, poor inspiratory effort Wound: Dressed and dry    Lab Results: CBC: Recent Labs  11/14/13 0400  WBC 8.7  HGB 11.5*  HCT 34.2*  PLT 152   BMET:  Recent Labs  11/14/13 0400  NA 138  K 4.5  CL 101  CO2 24  GLUCOSE 107*  BUN 26*  CREATININE 1.19  CALCIUM 9.3    PT/INR: No results found for this basename: LABPROT, INR,  in the last 72 hours    Assessment/Plan: S/P Procedure(s) (LRB): MEDIAN STERNOTOMY (N/A) Resection of anterior mediastinal tumor (N/A)  Pain control seems to be his primary issue.  Continue Toradol, Percocet for now.  LG temp, likely secondary to atelectasis.  Encouraged aggressive pulm toilet.  Will add FV.   Mobilize, wean O2 as able.    LOS: 6 days    Tarik Teixeira H 11/15/2013

## 2013-11-16 LAB — TYPE AND SCREEN
ABO/RH(D): B POS
ANTIBODY SCREEN: NEGATIVE
Unit division: 0
Unit division: 0

## 2013-11-16 MED ORDER — OXYCODONE-ACETAMINOPHEN 5-325 MG PO TABS: 1.0000 | ORAL_TABLET | Freq: Four times a day (QID) | ORAL | Status: AC | PRN

## 2013-11-16 NOTE — Progress Notes (Addendum)
[address]       [place] [address]             [phone]      3 Days Post-Op  Procedure(s) (LRB): MEDIAN STERNOTOMY (N/A) Resection of anterior mediastinal tumor (N/A) Subjective: Feels well  Objective  Telemetry sinus rhythm/sinus tachy  Temp:  [98.1 F (36.7 C)-99.2 F (37.3 C)] 99.2 F (37.3 C) (01/29 0422) Pulse Rate:  [109-110] 109 (01/29 0422) Resp:  [18-28] 28 (01/29 0422) BP: (112-134)/(56-85) 134/71 mmHg (01/29 0422) SpO2:  [94 %-98 %] 98 % (01/29 0422)   Intake/Output Summary (Last 24 hours) at 11/16/13 0809 Last data filed at 11/15/13 1700  Gross per 24 hour  Intake    490 ml  Output    250 ml  Net    240 ml       General appearance: alert, cooperative and no distress Heart: regular rate and rhythm Lungs: clear to auscultation bilaterally Abdomen: benign Extremities: no edema Wound: incis healing well  Lab Results:  Recent Labs  11/14/13 0400  NA 138  K 4.5  CL 101  CO2 24  GLUCOSE 107*  BUN 26*  CREATININE 1.19  CALCIUM 9.3   No results found for this basename: AST, ALT, ALKPHOS, BILITOT, PROT, ALBUMIN,  in the last 72 hours No results found for this basename: LIPASE, AMYLASE,  in the last 72 hours  Recent Labs  11/14/13 0400  WBC 8.7  HGB 11.5*  HCT 34.2*  MCV 85.3  PLT 152   No results found for this basename: CKTOTAL, CKMB, TROPONINI,  in the last 72 hours No components found with this basename: POCBNP,  No results found for this basename: DDIMER,  in the last 72 hours No results found for this basename: HGBA1C,  in the last 72 hours No results found for this basename: CHOL, HDL, LDLCALC, TRIG, CHOLHDL,  in the last 72 hours No results found for this basename: TSH, T4TOTAL, FREET3, T3FREE, THYROIDAB,  in the last 72 hours No results found for this basename: VITAMINB12, FOLATE, FERRITIN, TIBC, IRON, RETICCTPCT,  in the last 72 hours  Medications: Scheduled . aspirin  325 mg Oral Daily  .  atorvastatin  40 mg Oral q1800  . bisacodyl  10 mg Oral Daily  . isosorbide mononitrate  30 mg Oral Daily  . metoprolol  50 mg Oral BID  . pantoprazole  40 mg Oral BID AC     Radiology/Studies:  Dg Chest 2 View  11/15/2013   CLINICAL DATA:  Partial sternotomy and resection of anterior mediastinal mass.  EXAM: CHEST  2 VIEW  COMPARISON:  11/14/2013  FINDINGS: Right jugular central venous catheter tip in the lower SVC is unchanged. No pneumothorax.  Mild left lower lobe atelectasis, slightly improved. Negative for edema. Possible small left effusion. Right lung is clear.  IMPRESSION: Improved aeration in the left lower lobe. There remains a small amount of left lower lobe atelectasis and a small left effusion.   Electronically Signed   By: Franchot Gallo M.D.   On: 11/15/2013 08:36    INR: Will add last result for INR, ABG once components are confirmed Will add last 4 CBG results once components are confirmed  Assessment/Plan: S/P Procedure(s) (LRB): MEDIAN STERNOTOMY (N/A) Resection of anterior mediastinal tumor (N/A)  1 conts to do well 2 path - thymic hyperplasia 3 cont routine rehab/pulm toilet- poss home later today or tomorrow    LOS: 7 days  GOLD,WAYNE E 1/29/20158:09 AM   Chart reviewed, patient examined, agree with above. He looks good and can go home. 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 I discussed this with the patient.

## 2013-11-16 NOTE — Progress Notes (Signed)
Patient discharged prior to another bedside visit on behalf of Link to Wellness program for [employer] employees/dependents with [employer]. He was given Link to [employer] and contact information prior. Marthenia Rolling, MSN, RN, BSN- [facility] [phone]

## 2013-11-16 NOTE — Progress Notes (Signed)
D/c instructions reviewed with pt, copy of instructions and script given to pt. Pt instructed to keep dressing to R neck(central line) site on x 24 hours from 1230 today til tomorrow. Pt d/c'd with belongings, pt refused wheelchair, pt ambulated out, steady gait.

## 2013-11-16 NOTE — Progress Notes (Signed)
Patient given instructions for care of post central line removal site via teach back method.  Emryn Flanery, Nicolette Bang, RN VAST

## 2013-11-16 NOTE — Discharge Instructions (Signed)
No heavy lifting (more than 10 pounds) No driving till seen by the surgeon Keep incision clean and dry, you may shower and gently pat the incision dry Notify office if temperature greater than 101 Continue breathing exercises Increase walking daily

## 2013-11-17 NOTE — Op Note (Signed)
CARDIOVASCULAR SURGERY OPERATIVE NOTE  11/13/2013 Alejandro Reyes [id number]  Surgeon:  Gaye Pollack, MD  First Assistant: Ellwood Handler, [facility]   Preoperative Diagnosis: Anterior mediastinal mass  Postoperative Diagnosis:  Same   Procedure:  1. Median Sternotomy 2.   Resection of anterior mediastinal mass, clinically hyperplastic thymus.  Anesthesia:  General Endotracheal   Clinical History/Surgical Indication:  The patient is a 53 year old black male with a history of mild, non-obstructive coronary disease, HTN, chronic systolic CHF, smoking and COPD who reports a several year history of chest pain. He says he had an MI in November 2013 and ever since has had recurrent substernal chest pain that is present most of the time and sometimes is quite severe. It is not exertionally related and is made worse by lying down. He was in the hospital a couple weeks ago for the chest pain and an echo was unremarkable. His troponin was positive and ECG showed inferior and lateral ST depression with sinus tach 126. He was cathed and it showed mild, non-obstructive coronary disease. It was felt that he most likely had pericarditis. He had a chest CT which ruled out PE but did show an enlarging anterior mediastinal mass compared to prior CT on 10/29/2009 and 08/13/2012. He has an enlarging anterior mediastinal mass that is clearly larger on sequential CT scans since 2011. It may be a thymoma but could also be a carcinoma. It does not appear to be invading surrounding structures and I would not think this would cause the kind of severe persistent pain that he describes. I think his pain does sound more like pericarditis although he has no pericardial effusion or thickening, no rub. I think the mediastinal mass should be resected since it is getting larger and looks completely resectable at this time. I told him that I did not think this would resolve his pain but you never know for sure. I discussed the  surgical procedure, alternatives, benefits and risks including but not limited to bleeding, blood transfusion, infection, incomplete resection, recurrence, and phrenic nerve injury with diaphragmatic paralysis with the patient and he would like to proceed.   Preparation:  The patient was seen in the preoperative holding area and the correct patient, correct operation were confirmed with the patient after reviewing the medical record and CT scan. The consent was signed by me. Preoperative antibiotics were given. A radial arterial line were placed by the anesthesia team. The patient was taken back to the operating room and positioned supine on the operating room table. After being placed under general endotracheal anesthesia by the anesthesia team a foley catheter was placed. The neck, chest, and abdomen were prepped with betadine soap and solution and draped in the usual sterile manner. A surgical time-out was taken and the correct patient and operative procedure were confirmed with the nursing and anesthesia staff.   Median sternotomy and resection of mass:  A partial upper sternotomy was performed through a short vertical midline incision. The sternotomy went down to the 2nd intercostal space. A laminar spreader was placed to retract the sternal edges apart. Visualization was good. The thymus gland was immediately encountered and was large. This was separated from the innominate vein by dividing the venous branches between ligatures. The thymus was mobilized off the pericardium posteriorly and then mobilization was continued laterally to the right and left to completely remove the thymus gland. This extended almost to the phrenic nerve on both sides and the nerve was identified and preserved while  removing the entire thymus. Then mobilization was continued inferiorly over the pericardium and the thymus gland removed in entirety. There was no discrete mass in it. It looked like thymic hyperplasia.  Hemostasis was complete. A 28 F Bard drain was brought through a separate incision and positioned  In the anterior mediastinum.  The sternum was closed with  #6 stainless steel wires. The fascia was closed with continuous # 1 vicryl suture. The subcutaneous tissue was closed with 2-0 vicryl continuous suture. The skin was closed with 3-0 vicryl subcuticular suture. All sponge, needle, and instrument counts were reported correct at the end of the case. Dry sterile dressings were placed over the incisions and around the chest tubes which were connected to pleurevac suction. The patient was then transported to the post anesthesia care unit in stable condition.

## 2013-11-23 ENCOUNTER — Encounter (HOSPITAL_COMMUNITY): Payer: Self-pay | Admitting: Emergency Medicine

## 2013-11-23 ENCOUNTER — Emergency Department (HOSPITAL_COMMUNITY): Payer: 59

## 2013-11-23 ENCOUNTER — Observation Stay (HOSPITAL_COMMUNITY)
Admission: EM | Admit: 2013-11-23 | Discharge: 2013-12-17 | Disposition: E | Payer: 59 | Attending: Internal Medicine | Admitting: Internal Medicine

## 2013-11-23 DIAGNOSIS — K219 Gastro-esophageal reflux disease without esophagitis: Secondary | ICD-10-CM | POA: Insufficient documentation

## 2013-11-23 DIAGNOSIS — Z8639 Personal history of other endocrine, nutritional and metabolic disease: Secondary | ICD-10-CM | POA: Diagnosis present

## 2013-11-23 DIAGNOSIS — Z7982 Long term (current) use of aspirin: Secondary | ICD-10-CM | POA: Insufficient documentation

## 2013-11-23 DIAGNOSIS — R0789 Other chest pain: Secondary | ICD-10-CM | POA: Diagnosis present

## 2013-11-23 DIAGNOSIS — I238 Other current complications following acute myocardial infarction: Secondary | ICD-10-CM | POA: Insufficient documentation

## 2013-11-23 DIAGNOSIS — I498 Other specified cardiac arrhythmias: Secondary | ICD-10-CM | POA: Insufficient documentation

## 2013-11-23 DIAGNOSIS — I4901 Ventricular fibrillation: Secondary | ICD-10-CM | POA: Insufficient documentation

## 2013-11-23 DIAGNOSIS — I10 Essential (primary) hypertension: Secondary | ICD-10-CM | POA: Diagnosis present

## 2013-11-23 DIAGNOSIS — J449 Chronic obstructive pulmonary disease, unspecified: Secondary | ICD-10-CM

## 2013-11-23 DIAGNOSIS — J4489 Other specified chronic obstructive pulmonary disease: Secondary | ICD-10-CM | POA: Insufficient documentation

## 2013-11-23 DIAGNOSIS — R7989 Other specified abnormal findings of blood chemistry: Secondary | ICD-10-CM

## 2013-11-23 DIAGNOSIS — I214 Non-ST elevation (NSTEMI) myocardial infarction: Secondary | ICD-10-CM | POA: Insufficient documentation

## 2013-11-23 DIAGNOSIS — I5022 Chronic systolic (congestive) heart failure: Secondary | ICD-10-CM | POA: Insufficient documentation

## 2013-11-23 DIAGNOSIS — R0602 Shortness of breath: Secondary | ICD-10-CM | POA: Insufficient documentation

## 2013-11-23 DIAGNOSIS — F1011 Alcohol abuse, in remission: Secondary | ICD-10-CM | POA: Insufficient documentation

## 2013-11-23 DIAGNOSIS — E0520 Thyrotoxicosis with toxic multinodular goiter without thyrotoxic crisis or storm: Secondary | ICD-10-CM | POA: Insufficient documentation

## 2013-11-23 DIAGNOSIS — R079 Chest pain, unspecified: Principal | ICD-10-CM | POA: Diagnosis present

## 2013-11-23 DIAGNOSIS — F17200 Nicotine dependence, unspecified, uncomplicated: Secondary | ICD-10-CM | POA: Insufficient documentation

## 2013-11-23 DIAGNOSIS — R778 Other specified abnormalities of plasma proteins: Secondary | ICD-10-CM

## 2013-11-23 DIAGNOSIS — R000 Tachycardia, unspecified: Secondary | ICD-10-CM | POA: Diagnosis present

## 2013-11-23 DIAGNOSIS — I509 Heart failure, unspecified: Secondary | ICD-10-CM | POA: Insufficient documentation

## 2013-11-23 DIAGNOSIS — I2510 Atherosclerotic heart disease of native coronary artery without angina pectoris: Secondary | ICD-10-CM | POA: Diagnosis present

## 2013-11-23 DIAGNOSIS — J9859 Other diseases of mediastinum, not elsewhere classified: Secondary | ICD-10-CM | POA: Diagnosis present

## 2013-11-23 DIAGNOSIS — M549 Dorsalgia, unspecified: Secondary | ICD-10-CM | POA: Insufficient documentation

## 2013-11-23 DIAGNOSIS — R51 Headache: Secondary | ICD-10-CM

## 2013-11-23 LAB — BASIC METABOLIC PANEL
BUN: 54 mg/dL — AB (ref 6–23)
CHLORIDE: 99 meq/L (ref 96–112)
CO2: 24 meq/L (ref 19–32)
Calcium: 10.8 mg/dL — ABNORMAL HIGH (ref 8.4–10.5)
Creatinine, Ser: 1.38 mg/dL — ABNORMAL HIGH (ref 0.50–1.35)
GFR calc Af Amer: 66 mL/min — ABNORMAL LOW (ref >90)
GFR calc non Af Amer: 57 mL/min — ABNORMAL LOW (ref >90)
Glucose, Bld: 126 mg/dL — ABNORMAL HIGH (ref 70–99)
Potassium: 4.3 mEq/L (ref 3.7–5.3)
Sodium: 140 mEq/L (ref 137–147)

## 2013-11-23 LAB — CBC
HEMATOCRIT: 33.3 % — AB (ref 39.0–52.0)
HEMOGLOBIN: 11.5 g/dL — AB (ref 13.0–17.0)
MCH: 29 pg (ref 26.0–34.0)
MCHC: 34.5 g/dL (ref 30.0–36.0)
MCV: 84.1 fL (ref 78.0–100.0)
Platelets: 252 10*3/uL (ref 150–400)
RBC: 3.96 MIL/uL — AB (ref 4.22–5.81)
RDW: 12.6 % (ref 11.5–15.5)
WBC: 5.1 10*3/uL (ref 4.0–10.5)

## 2013-11-23 LAB — RAPID URINE DRUG SCREEN, HOSP PERFORMED
Amphetamines: NOT DETECTED
BARBITURATES: NOT DETECTED
Benzodiazepines: NOT DETECTED
Cocaine: NOT DETECTED
Opiates: POSITIVE — AB
Tetrahydrocannabinol: NOT DETECTED

## 2013-11-23 LAB — POCT I-STAT TROPONIN I: Troponin i, poc: 0.02 ng/mL (ref 0.00–0.08)

## 2013-11-23 LAB — PRO B NATRIURETIC PEPTIDE: Pro B Natriuretic peptide (BNP): 355.6 pg/mL — ABNORMAL HIGH (ref 0–125)

## 2013-11-23 MED ORDER — LORAZEPAM 2 MG/ML IJ SOLN
1.0000 mg | Freq: Once | INTRAMUSCULAR | Status: AC
  Administered 2013-11-23: 1 mg via INTRAVENOUS
  Filled 2013-11-23: qty 1

## 2013-11-23 MED ORDER — METOPROLOL TARTRATE 1 MG/ML IV SOLN
5.0000 mg | Freq: Once | INTRAVENOUS | Status: AC
  Administered 2013-11-23: 5 mg via INTRAVENOUS
  Filled 2013-11-23: qty 5

## 2013-11-23 MED ORDER — MAGNESIUM SULFATE 50 % IJ SOLN
INTRAMUSCULAR | Status: AC | PRN
  Administered 2013-11-23: 2 g via INTRAVENOUS

## 2013-11-23 MED ORDER — SUCCINYLCHOLINE CHLORIDE 20 MG/ML IJ SOLN
INTRAMUSCULAR | Status: AC | PRN
  Administered 2013-11-23: 120 mg via INTRAVENOUS

## 2013-11-23 MED ORDER — LORAZEPAM 2 MG/ML IJ SOLN
INTRAMUSCULAR | Status: AC
  Filled 2013-11-23: qty 1

## 2013-11-23 MED ORDER — IPRATROPIUM BROMIDE 0.02 % IN SOLN
0.5000 mg | Freq: Once | RESPIRATORY_TRACT | Status: AC
  Administered 2013-11-23: 0.5 mg via RESPIRATORY_TRACT
  Filled 2013-11-23: qty 2.5

## 2013-11-23 MED ORDER — MORPHINE SULFATE 4 MG/ML IJ SOLN
4.0000 mg | Freq: Once | INTRAMUSCULAR | Status: AC
  Administered 2013-11-23: 4 mg via INTRAVENOUS
  Filled 2013-11-23: qty 1

## 2013-11-23 MED ORDER — EPINEPHRINE HCL 0.1 MG/ML IJ SOSY
PREFILLED_SYRINGE | INTRAMUSCULAR | Status: AC | PRN
  Administered 2013-11-23 (×3): 1 mg via INTRAVENOUS

## 2013-11-23 MED ORDER — SODIUM BICARBONATE 8.4 % IV SOLN
INTRAVENOUS | Status: AC | PRN
  Administered 2013-11-23: 50 meq via INTRAVENOUS

## 2013-11-23 MED ORDER — HYDROMORPHONE HCL PF 1 MG/ML IJ SOLN
1.0000 mg | Freq: Once | INTRAMUSCULAR | Status: AC
Start: 2013-11-23 — End: 2013-11-23
  Administered 2013-11-23: 1 mg via INTRAVENOUS

## 2013-11-23 MED ORDER — HYDROMORPHONE HCL PF 1 MG/ML IJ SOLN
1.0000 mg | Freq: Once | INTRAMUSCULAR | Status: DC
  Filled 2013-11-23: qty 1

## 2013-11-23 MED ORDER — IOHEXOL 350 MG/ML SOLN
80.0000 mL | Freq: Once | INTRAVENOUS | Status: AC | PRN
  Administered 2013-11-23: 80 mL via INTRAVENOUS

## 2013-11-23 MED ORDER — AMIODARONE HCL IN DEXTROSE 360-4.14 MG/200ML-% IV SOLN: 60.0000 mg/h | INTRAVENOUS | Status: DC

## 2013-11-23 MED ORDER — ALBUTEROL SULFATE (2.5 MG/3ML) 0.083% IN NEBU
5.0000 mg | INHALATION_SOLUTION | Freq: Once | RESPIRATORY_TRACT | Status: AC
  Administered 2013-11-23: 5 mg via RESPIRATORY_TRACT
  Filled 2013-11-23: qty 6

## 2013-11-23 MED ORDER — SODIUM CHLORIDE 0.9 % IV SOLN
INTRAVENOUS | Status: AC | PRN
  Administered 2013-11-23: 999 mL/h via INTRAVENOUS

## 2013-11-23 MED ORDER — DEXTROSE 5 % IV SOLN
300.0000 mg | INTRAVENOUS | Status: AC | PRN
  Administered 2013-11-23: 300 mg via INTRAVENOUS

## 2013-11-23 MED ORDER — FUROSEMIDE 10 MG/ML IJ SOLN
60.0000 mg | INTRAMUSCULAR | Status: AC
  Administered 2013-11-23: 60 mg via INTRAVENOUS
  Filled 2013-11-23: qty 6

## 2013-11-23 MED ORDER — AMIODARONE HCL IN DEXTROSE 360-4.14 MG/200ML-% IV SOLN
30.0000 mg/h | INTRAVENOUS | Status: DC
  Filled 2013-11-23: qty 200

## 2013-11-23 MED FILL — Medication: Qty: 1 | Status: AC

## 2013-11-24 NOTE — Discharge Summary (Signed)
[address]       [place] [address]             [phone]       Cliff Rothfeld 1961-09-01 53 y.o. [id number]  11/09/2013 11/16/2013  No att. providers found  Chronic airway obstruction, not elsewhere classified [496] Mediastinal mass [786.6] GERD (gastroesophageal reflux disease) [530.81] Tachycardia 123456 Systolic CHF, chronic 99991111, 428.0] Chest pain, atypical [786.59] Chest pain [786.50]  HPI: At time of consultation And a wall and a coronary artery was as a substernal chest a every in the he began The patient is a 53 year old black male with a history of mild, non-obstructive coronary disease, HTN, chronic systolic CHF, smoking and COPD who reports a several year history of chest pain. He says he had an MI in November 2013 and ever since has had recurrent substernal chest pain that is present most of the time and sometimes is quite severe. It is not exertionally related and is made worse by lying down. He was in the hospital a couple weeks ago for the chest pain and an echo was unremarkable. His troponin was positive and ECG showed inferior and lateral ST depression with sinus tach 126. He was cathed and it showed mild, non-obstructive coronary disease. It was felt that he most likely had pericarditis. He had a chest CT which ruled out PE but did show an enlarging anterior mediastinal mass compared to prior CT on 10/29/2009 and 08/13/2012. Thoracic surgical consultation was obtained with Arvid Right M.D. wh evaluated the patient and his studies and agreed with recommendations to resect the anterior mediastinal mass.   Past Medical History   Diagnosis  Date   .  Hypertension    .  Chronic systolic CHF (congestive heart failure), NYHA class 1    .  Tobacco abuse    .  COPD (chronic obstructive pulmonary disease)    .  GERD (gastroesophageal reflux disease)    .  Alcohol abuse, in remission    .  Hyperthyroidism    .  CAD (coronary artery disease)      .  Pericarditis    .  Pneumonia    .  Hx of Graves' disease  11/03/2013    Past Surgical History   Procedure  Laterality  Date   .  Tonsillectomy     .  Esophagogastroduodenoscopy   08/26/2012     Procedure: ESOPHAGOGASTRODUODENOSCOPY (EGD); Surgeon: Inda Castle, MD; Location: [facility]; Service: Endoscopy; Laterality: N/A;   .  Appendectomy   1976    Family History   Problem  Relation  Age of Onset   .  Hypertension  Father    .  Cancer - Other  Father      SCCa of Oropharynx   .  Kidney disease  Father    .  Alcohol abuse  Father    .  Kidney disease  Brother    .  Heart disease  Maternal Aunt    .  Heart disease  Paternal Aunt    .  Heart disease  Maternal Grandfather    .  Cancer  Neg Hx    .  Diabetes  Neg Hx    .  Early death  Neg Hx    .  Hyperlipidemia  Neg Hx    .  Stroke  Neg Hx     Social History: reports that he has been smoking Cigarettes. He has been smoking  about 0.00 packs per day. He has never used smokeless tobacco. He reports that he does not drink alcohol or use illicit drugs.  Allergies: No Known Allergies  Medications:  I have reviewed the patient's current medications.  Prior to Admission:  Prescriptions prior to admission   Medication  Sig  Dispense  Refill   .  aspirin 325 MG tablet  Take 325 mg by mouth daily.     Marland Kitchen  atorvastatin (LIPITOR) 40 MG tablet  Take 40 mg by mouth daily.     .  isosorbide mononitrate (IMDUR) 30 MG 24 hr tablet  Take 30 mg by mouth daily.     Marland Kitchen  losartan-hydrochlorothiazide (HYZAAR) 100-25 MG per tablet  Take 1 tablet by mouth daily.     .  metoprolol (LOPRESSOR) 50 MG tablet  Take 50 mg by mouth 2 (two) times daily.     .  nitroGLYCERIN (NITROSTAT) 0.4 MG SL tablet  Place 0.4 mg under the tongue every 5 (five) minutes as needed for chest pain.     .  pantoprazole (PROTONIX) 40 MG tablet  Take 1 tablet (40 mg total) by mouth 2 (two) times daily before a meal.  60 tablet  6   .  potassium chloride SA (K-DUR,KLOR-CON)  20 MEQ tablet  Take 2 tablets (40 mEq total) by mouth daily.  60 tablet  6   .  ibuprofen (ADVIL,MOTRIN) 800 MG tablet  Take 800 mg by mouth daily as needed for fever, headache or moderate pain.        Hospital Course:  The patient was medically stable to proceed with surgery and on 11/13/2013 he was taken the operating room at which time he underwent the following procedure: CARDIOVASCULAR SURGERY OPERATIVE NOTE  11/13/2013  Felipa Emory  [id number]  Surgeon: Gaye Pollack, MD  First Assistant: Ellwood Handler, [facility]  Preoperative Diagnosis: Anterior mediastinal mass  Postoperative Diagnosis: Same  Procedure:  1. Median Sternotomy 2. Resection of anterior mediastinal mass, clinically hyperplastic thymus.  Anesthesia: General Endotracheal  The patient was then transported to the post anesthesia care unit in stable condition.   Postoperative hospital course: Overall the patient did progress nicely. He did have some issues with pain control which were stabilized over time. Incision was noted to be healing well. All routine lines, monitors and drainage devices were discontinued in the standard fashion. Oxygen was weaned and he maintains good saturations on room air. He tolerating gradually increasing activities using standard protocols. He had a mild acute blood loss anemia which stabilized. At time of discharge he was felt to be quite stable.  Pathology: Thymic hyperplasia, please see the dictated report for full details.    Recent Labs  12-21-13 0910  NA 140  K 4.3  CL 99  CO2 24  GLUCOSE 126*  BUN 54*  CALCIUM 10.8*    Recent Labs  2013/12/21 0910  WBC 5.1  HGB 11.5*  HCT 33.3*  PLT 252   No results found for this basename: INR,  in the last 72 hours   Discharge Instructions:  The patient is discharged to home with extensive instructions on wound care and progressive ambulation.  They are instructed not to drive or perform any heavy lifting until returning to see the  physician in his office.  Discharge Diagnosis:  Chronic airway obstruction, not elsewhere classified [496] Mediastinal mass [786.6] GERD (gastroesophageal reflux disease) [530.81] Tachycardia [563.8] Systolic CHF, chronic [756.43, 428.0] Chest pain, atypical [786.59] Chest pain [786.50]  anterior mediastinal mass-thymic hyperplasia   Secondary Diagnosis: Patient Active Problem List   Diagnosis Date Noted  . Mediastinal mass, with resection with thymic hyperplasia   11/09/2013  . Chest pain, atypical 11/09/2013  . Tachycardia 11/09/2013  . Chest pain 11/03/2013  . Unstable angina 11/03/2013  . Elevated troponin I level 11/03/2013  . CAD (coronary artery disease), non obstructive with cath 2013 and 11/03/13 11/03/2013  . Hx of Graves' disease 11/03/2013  . Heart murmur 07/11/2013  . Headache(784.0) 07/11/2013  . Insomnia 07/11/2013  . Erectile dysfunction 04/18/2013  . Routine general medical examination at a health care facility 04/18/2013  . GERD (gastroesophageal reflux disease) 08/28/2012  . Hyperthyroidism 08/25/2012  . Systolic CHF, chronic 75/07/2584  . HYPERTENSION 11/15/2009  . COPD 11/15/2009   Past Medical History  Diagnosis Date  . Hypertension   . Chronic systolic CHF (congestive heart failure), NYHA class 1   . Tobacco abuse   . COPD (chronic obstructive pulmonary disease)   . GERD (gastroesophageal reflux disease)   . Alcohol abuse, in remission   . Hyperthyroidism   . CAD (coronary artery disease)   . Pericarditis   . Pneumonia   . Hx of Graves' disease 11/03/2013        Medication List    STOP taking these medications       ibuprofen 800 MG tablet  Commonly known as:  ADVIL,MOTRIN     potassium chloride SA 20 MEQ tablet  Commonly known as:  K-DUR,KLOR-CON      TAKE these medications       aspirin 325 MG tablet  Take 325 mg by mouth daily.     atorvastatin 40 MG tablet  Commonly known as:  LIPITOR  Take 40 mg by mouth daily.      isosorbide mononitrate 30 MG 24 hr tablet  Commonly known as:  IMDUR  Take 30 mg by mouth daily.     losartan-hydrochlorothiazide 100-25 MG per tablet  Commonly known as:  HYZAAR  Take 1 tablet by mouth daily.     metoprolol 50 MG tablet  Commonly known as:  LOPRESSOR  Take 50 mg by mouth 2 (two) times daily.     nitroGLYCERIN 0.4 MG SL tablet  Commonly known as:  NITROSTAT  Place 0.4 mg under the tongue every 5 (five) minutes as needed for chest pain.     oxyCODONE-acetaminophen 5-325 MG per tablet  Commonly known as:  PERCOCET/ROXICET  Take 1-2 tablets by mouth every 6 (six) hours as needed for severe pain.     pantoprazole 40 MG tablet  Commonly known as:  PROTONIX  Take 1 tablet (40 mg total) by mouth 2 (two) times daily before a meal.       Follow-up Information   Follow up with Scarlette Calico, MD. Schedule an appointment as soon as possible for a visit in 1 week. (hospital followup )    Specialty:  Internal Medicine   Contact information:   [address]LO[address] [place] [address] [phone]       Follow up with [facility], MD. Schedule an appointment as soon as possible for a visit in 1 week. (followup thyroid)    Specialty:  Endocrinology   Contact information:   [place] [address] [phone]       Follow up with [facility]. (12/07/2013 To the nurse for suture removal at Dr. Vivi Martens office.)  Follow up with Gaye Pollack, MD. (11/29/2013 at 10:30 AM to see Dr. Cyndia Bent. Please obtain a chest x-ray a [facility] at 9:30 AM. [facility] is located in the same office complex.)    Specialty:  Cardiothoracic Surgery   Contact information:   [place] [address] [phone]       Disposition: Discharged home  Patient's condition is Kathyrn Drown, PA-C 11/24/2013  1:27 PM

## 2013-11-28 ENCOUNTER — Encounter: Payer: 59 | Admitting: Cardiovascular Disease

## 2013-11-29 ENCOUNTER — Ambulatory Visit: Payer: 59 | Admitting: Surgery

## 2013-12-07 ENCOUNTER — Telehealth: Payer: Self-pay | Admitting: Cardiovascular Disease

## 2013-12-07 NOTE — Telephone Encounter (Signed)
Original D/C Received, gave to Fraser Din To See If Dr.McAlhany Will sign He's back in Office On Friday

## 2013-12-08 ENCOUNTER — Telehealth: Payer: Self-pay | Admitting: Cardiovascular Disease

## 2013-12-08 ENCOUNTER — Telehealth: Payer: Self-pay

## 2013-12-08 NOTE — Telephone Encounter (Signed)
Called Levada Dy at [employer] , we are experiencing difficulty obtaining a signature for death certificate. Suggested she try [facility], Dr. Gilford Raid. rmf 12/06/13.

## 2013-12-08 NOTE — Telephone Encounter (Signed)
Pat/Dr.McAlhany gave D/C back to Me this am he stated Dr.Jones Needs to sign, I called spoke with Nancy/Primary Care and asked if She Could Get Dr.Jones assist To call him and Verify He will or Will Not Sign D/C. Izora Gala will also Be Speaking With Lou/office Manager And see If she can Get a hold Of Dr.Jones. Izora Gala has My # and Will call me back. I have also Spoke with Angela/[place] Let Her know I am Working On this to [employer] She Ok'd and I will Call her Back Once I find Out Something.

## 2013-12-08 NOTE — Telephone Encounter (Signed)
Alejandro Reyes called Back From Dr.Jones Office Left Message On Vm stating he was Presented With this D/C last Week and said He will not Sign I called Verified With Izora Gala who told me the Same Thing. I have Given This back to Dr.McAlhany to See if I can Get a Signature.

## 2013-12-17 NOTE — ED Provider Notes (Signed)
Called to see patient b/o worsening condition. Pt is diaphoretic c/o chest pain. HR up to 130's. On exam he has bilateral rales.  Left room to look at chart. CXR reviewed and does not appear to have CHF present. Repeat EKG shows ST.   Reexam 09:40 after given first dose of pain meds, slightly calmer, still diaphoretic. Has some rhonchi on exam now without rales. Pt states he has had pain in his anterior and posterior chest for the past 3 years. States the pain has continued since his surgery and states no one has been able to determine the source of his pain. States he still smokes 1/2 ppd and he coughs up mucus of different colors. States nothing he does makes the pain worse. ? Feels his heart racing.  EKG Interpretation    Date/Time:  2013/12/14 08:23:45 EST Ventricular Rate:  122 PR Interval:  174 QRS Duration: 102 QT Interval:  304 QTC Calculation: 433 R Axis:   59 Text Interpretation:  Sinus tachycardia Possible Left atrial enlargement Incomplete right bundle branch block Left ventricular hypertrophy Since last tracing ST less depressed in Lateral leads Confirmed by Kemiyah Tarazon  MD-I, Sayde Lish ([id number]) on December 14, 2013 8:48:36 AM             Medical screening examination/treatment/procedure(s) were conducted as a shared visit with non-physician practitioner(s) and myself.  I personally evaluated the patient during the encounter.    Rolland Porter, MD, FACEP   Janice Norrie, MD 12/14/13 [phone]

## 2013-12-17 NOTE — ED Notes (Signed)
Dr. Tomi Bamberger attempting to intubate pt. Unsuccessful.

## 2013-12-17 NOTE — ED Notes (Signed)
Pt moved from POD A to POD 6. Room 26.

## 2013-12-17 NOTE — ED Notes (Addendum)
Pt is groaning in 10/10 chest and back pain, diaphoretic, HR 140's, SOB. Placed pt on 2 lpm [place]. MD Tomi Bamberger and PA made aware.

## 2013-12-17 NOTE — Progress Notes (Signed)
Chaplain made multiple attempts to contact a member of pt's family.  Chaplain spoke to pt's former wife and told her pt is at [facility].   Other numbers given to chaplain were "not in service".     11/25/2013 1700  Clinical Encounter Type  Visited With Patient not available  Visit Type Death    Estelle June, chaplaini pager [phone]

## 2013-12-17 NOTE — ED Notes (Signed)
Wallet and cell phone given to security.

## 2013-12-17 NOTE — ED Provider Notes (Signed)
4:40 PM Discussed the patient with the admitting Dr. Hartford Poli, and informed him that the patient had expired.  Montine Circle, PA-C 2013-11-25 [phone]

## 2013-12-17 NOTE — ED Notes (Signed)
Pt remains profusely diaphoretic and c/o chest and back pain. Dr. Tomi Bamberger notified.

## 2013-12-17 NOTE — ED Provider Notes (Signed)
15:52 Alejandro Reyes, ME does not feel he will meet criteria for a autopsy.   16:03 Dr Scarlette Calico, his PCP, given details of his ED visit, will sign his death certificate.   Janice Norrie, MD 12/08/2013 [phone]

## 2013-12-17 NOTE — ED Notes (Signed)
MD at bedside. Hospitalist

## 2013-12-17 NOTE — ED Notes (Signed)
Patient transported to CT 

## 2013-12-17 NOTE — ED Provider Notes (Signed)
See prior note   Janice Norrie, MD 12/13/2013 1550

## 2013-12-17 NOTE — ED Notes (Signed)
Requested Dr. Tomi Bamberger to come to pt's bedside for reevaluation. "I have already been in there and assessed his pain."

## 2013-12-17 NOTE — ED Notes (Signed)
This nurse was notified by Unit Secretary. Pt found lying on stretcher unresponsive, pulseless in V-fib. Dr. Tomi Bamberger notified immediately and at bedside. High quality CPR initiated. Code cart at bedside. Placed pt on defibrillator.

## 2013-12-17 NOTE — ED Notes (Addendum)
Pt moaning in pain, profusely diaphoretic. Dr. Tomi Bamberger made aware of pt's discomfort.

## 2013-12-17 NOTE — ED Provider Notes (Signed)
Medical screening examination/treatment/procedure(s) were conducted as a shared visit with non-physician practitioner(s) and myself.  I personally evaluated the patient during the encounter.  EKG Interpretation    Date/Time:  12/21/2013 08:23:45 EST Ventricular Rate:  122 PR Interval:  174 QRS Duration: 102 QT Interval:  304 QTC Calculation: 433 R Axis:   59 Text Interpretation:  Sinus tachycardia Possible Left atrial enlargement Incomplete right bundle branch block Left ventricular hypertrophy Since last tracing ST less depressed in Lateral leads Confirmed by [place]  MD-I, Maiana Hennigan ([id number]) on 12/21/13 8:48:36 AM             Rolland Porter, MD, Alanson Aly, MD 21-Dec-2013 [phone]

## 2013-12-17 NOTE — ED Notes (Signed)
MD at bedside.-Dr. Knapp 

## 2013-12-17 NOTE — ED Provider Notes (Signed)
Called to patients room emergently at 1455. Pt unresponsive, in coarse V fib on monitor. CPR was started, pt placed on defibrillator. His initial defibrillation converted him back to sinus rhythm with pulses. Patient was extremely diaphoretic and became extremely agitated and was fighting trying to get off the bed. His heart rate increased to a very fast rhythm and then he went back into a coarse V. fib for the second time. He was defibrillated a second time and converted to a sinus rhythm with pulses. He again then again had a fast rhythm and became combative. Ativan was contemplated however he again went into coarse V. fib. Patient was intubated with a 7.5 mm ET tube with CO2 color change. Glidescope was used. The initial attempt was unsuccessful. Patient was defibrillated an additional 5 times and remained in a course V fib rhythm. Patient received amiodarone 300 bolus and 150 mg bolus, epinephrine several times, bicarb x 1, magnesium 2 grams IV. Dr Mare Ferrari in attendance.  CPR and resuscitation was stopped at 15:26.   Cardiopulmonary Resuscitation (CPR) Procedure Note Directed/Performed by: Janice Norrie I personally directed ancillary staff and/or performed CPR in an effort to regain return of spontaneous circulation and to maintain cardiac, neuro and systemic perfusion.    Janice Norrie, MD 12/14/2013 1537

## 2013-12-17 NOTE — ED Notes (Signed)
Alejandro Reyes (brother) [phone]; attempted to call to update this family; number given by ex wife

## 2013-12-17 NOTE — ED Notes (Signed)
Pt's cousin, Nanetta Batty, notified of death by Dr. Vanita Panda.

## 2013-12-17 NOTE — Consult Note (Signed)
Reason for Consult: Chest pain and tachycardia, with recent probable pericarditis, then chest wall mediastinal ant mass with resection, clinically hyperplastic thymus    Referring Physician: Dr. Tomi Bamberger PCP: Scarlette Calico, MD Primary Cardiologist:Dr. Karol Liendo is an 53 y.o. male.    Chief Complaint: chest pain   HPI: 14 [place] who works here at [employer], presents today with severe chest pain that continues over last 4 days.  On presentation he HR was elevated ST 125, with pain.    Recent hx of cardiac cath  11/03/13 with:  Left main: 20% distal stenosis.  Left Anterior Descending Artery: Large caliber vessel that courses to the apex. The ostium appears to have 20-30% stenosis. The mid vessel has diffuse 20-30% stenosis. There are two small to moderate caliber diagonal branches without focal stenosis.  Circumflex Artery: Large caliber vessel that terminates into a moderate-sized branching obtuse marginal There is mild plaque in the proximal vessel and in the marginal branch. There is a small ramus branch.  Right Coronary Artery: Moderate caliber, dominant vessel with diffuse 40-50% mid stenosis.  Left Ventricular Angiogram: LVEF 65-70%. No wall motion abnormalities Echo without evidence of pericarditis or effusion but positive troponin with peak of 0.63.  He was discharged then returned on 11/09/13 and found to have ant mediastinal mass and underwent resection.  He did well after discharge for 3-4 days but now with severe chest pain unrelieved with home pain meds.   Here in ER ST which improved with control of pain.  Described as sharp pain to pressure.  Hasn't slept in 4 nights.  +diaphoresis, occ SOB.  Was to see Dr. Cyndia Bent today but could not tolerate pain any longer.  Currently improved.  Original pain >10/10  Now tolerable 4/10.  HR down to 110.   Past Medical History  Diagnosis Date  . Hypertension   . Chronic systolic CHF (congestive heart failure), NYHA class 1     . Tobacco abuse   . COPD (chronic obstructive pulmonary disease)   . GERD (gastroesophageal reflux disease)   . Alcohol abuse, in remission   . Hyperthyroidism   . CAD (coronary artery disease)   . Pericarditis   . Pneumonia   . Hx of Graves' disease 11/03/2013    Past Surgical History  Procedure Laterality Date  . Tonsillectomy    . Esophagogastroduodenoscopy  08/26/2012    Procedure: ESOPHAGOGASTRODUODENOSCOPY (EGD);  Surgeon: Inda Castle, MD;  Location: [place];  Service: Endoscopy;  Laterality: N/A;  . Appendectomy  1976  . Mediasternotomy N/A 11/13/2013    Procedure: MEDIAN STERNOTOMY;  Surgeon: Gaye Pollack, MD;  Location: [facility] OR;  Service: Thoracic;  Laterality: N/A;    Family History  Problem Relation Age of Onset  . Hypertension Father   . Cancer - Other Father     SCCa of Oropharynx  . Kidney disease Father   . Alcohol abuse Father   . Kidney disease Brother   . Heart disease Maternal Aunt   . Heart disease Paternal Aunt   . Heart disease Maternal Grandfather   . Cancer Neg Hx   . Diabetes Neg Hx   . Early death Neg Hx   . Hyperlipidemia Neg Hx   . Stroke Neg Hx    Social History:  reports that he has been smoking Cigarettes.  He has been smoking about 0.00 packs per day. He has never used smokeless tobacco. He reports that he does  not drink alcohol or use illicit drugs.  Allergies: No Known Allergies  OUTPATIENT MEDS: Pt not sure.  Previously on Imdur 30 mg daily ASA 325 mg daily lipitor 40 mg daily Ibuprofen prn Hyzaar 100-25 daily Lopressor 50 mg BID ntg prn protonix 40 mg BID kdur 20 meq 2 tabs daily Percocet 5-325 prn  Results for orders placed during the hospital encounter of 12-01-2013 (from the past 48 hour(s))  CBC     Status: Abnormal   Collection Time    Dec 01, 2013  9:10 AM      Result Value Range   WBC 5.1  4.0 - 10.5 K/uL   RBC 3.96 (*) 4.22 - 5.81 MIL/uL   Hemoglobin 11.5 (*) 13.0 - 17.0 g/dL   HCT 33.3 (*) 39.0 - 52.0 %    MCV 84.1  78.0 - 100.0 fL   MCH 29.0  26.0 - 34.0 pg   MCHC 34.5  30.0 - 36.0 g/dL   RDW 12.6  11.5 - 15.5 %   Platelets 252  150 - 400 K/uL  BASIC METABOLIC PANEL     Status: Abnormal   Collection Time    12/01/2013  9:10 AM      Result Value Range   Sodium 140  137 - 147 mEq/L   Potassium 4.3  3.7 - 5.3 mEq/L   Chloride 99  96 - 112 mEq/L   CO2 24  19 - 32 mEq/L   Glucose, Bld 126 (*) 70 - 99 mg/dL   BUN 54 (*) 6 - 23 mg/dL   Creatinine, Ser 1.38 (*) 0.50 - 1.35 mg/dL   Calcium 10.8 (*) 8.4 - 10.5 mg/dL   GFR calc non Af Amer 57 (*) >90 mL/min   GFR calc Af Amer 66 (*) >90 mL/min   Comment: (NOTE)     The eGFR has been calculated using the CKD EPI equation.     This calculation has not been validated in all clinical situations.     eGFR's persistently <90 mL/min signify possible Chronic Kidney     Disease.  PRO B NATRIURETIC PEPTIDE     Status: Abnormal   Collection Time    Dec 01, 2013  9:10 AM      Result Value Range   Pro B Natriuretic peptide (BNP) 355.6 (*) 0 - 125 pg/mL  POCT I-STAT TROPONIN I     Status: None   Collection Time    2013/12/01  9:14 AM      Result Value Range   Troponin i, poc 0.02  0.00 - 0.08 ng/mL   Comment 3            Comment: Due to the release kinetics of cTnI,     a negative result within the first hours     of the onset of symptoms does not rule out     myocardial infarction with certainty.     If myocardial infarction is still suspected,     repeat the test at appropriate intervals.   Ct Angio Chest W/cm &/or Wo Cm  12/01/2013   CLINICAL DATA:  Shortness of breath and chest pain  EXAM: CT ANGIOGRAPHY CHEST WITH CONTRAST  TECHNIQUE: Multidetector CT imaging of the chest was performed using the standard protocol during bolus administration of intravenous contrast. Multiplanar CT image reconstructions including MIPs were obtained to evaluate the vascular anatomy.  CONTRAST:  17m OMNIPAQUE IOHEXOL 350 MG/ML SOLN  COMPARISON:  Chest CT angiogram November 09, 2013 and chest radiograph November 23, 2013  FINDINGS: There is no demonstrable pulmonary embolus. There is no thoracic aortic aneurysm or dissection.  Lungs are clear.  No edema or consolidation.  In the interval since the prior study, the patient has had removal of a mass from the anterior mediastinum. There is some nonspecific increased attenuation in the postoperative area, felt to be due to recent surgery. A small amount of air is noted in the right pectoralis major muscle, probably due to postoperative change. There is no appreciable thoracic adenopathy. The pericardium is not thickened.  Small hiatal hernia is again noted.  In the visualized upper abdomen, there is incomplete visualization of a 1 cm nodular area in the right lobe of the liver posteriorly. The adrenals are not entirely visualize currently.  There are no blastic or lytic bone lesions.  Incomplete visualization of the thyroid shows thyroid enlargement with inhomogeneity of the attenuation.  Review of the MIP images confirms the above findings.  IMPRESSION: No demonstrable pulmonary embolus.  Lungs clear.  Postoperative change in the anterior mediastinum. No mass or adenopathy appreciable. Given the degree of postoperative change in the anterior mediastinum, some residual mass could be obscured at this time. In this regard, followup chest CT in approximately 3 months with particular attention of the anterior mediastinum may be warranted.  Enlarged thyroid with probable multinodular goiter. This finding may warrant nonemergent thyroid ultrasound to further evaluate.   Electronically Signed   By: Lowella Grip M.D.   On: 11/26/2013 10:37   Dg Chest Portable 1 View  2013-11-26   CLINICAL DATA:  Chest and back pain, shortness of breath, history hypertension, smoking, COPD, coronary artery disease, chronic systolic CHF  EXAM: PORTABLE CHEST - 1 VIEW  COMPARISON:  Portable exam 0842 hr compared to 11/15/2013  FINDINGS: Normal heart size post  median sternotomy.  Mediastinal contours and pulmonary vascularity normal.  Lungs clear.  No pleural effusion or pneumothorax.  Bones unremarkable.  IMPRESSION: No acute abnormalities.   Electronically Signed   By: Lavonia Dana M.D.   On: 26-Nov-2013 08:53    ROS: General:no colds or fevers, no weight changes Skin:no rashes or ulcers HEENT:no blurred vision, no congestion CV:see HPI PUL:see HPI GI:no diarrhea constipation or melena, no indigestion GU:no hematuria, no dysuria MS:no joint pain, no claudication Neuro:no syncope, no lightheadedness Endo:no diabetes, + thyroid disease, hx of graves   Blood pressure 97/47, pulse 105, temperature 97.7 F (36.5 C), temperature source Oral, resp. rate 19, SpO2 95.00%. PE: General:Pleasant affect, NAD, feeling better now with improvement of pain Skin:Warm and dry, brisk capillary refill HEENT:normocephalic, sclera clear, mucus membranes moist Neck:supple, no JVD, no bruits  Heart:S1S2 RRR without murmur, gallup, rub or click, chest wall incision well approximated and healing Lungs:clear to diminished without rales, rhonchi, or wheezes WCB:JSEG, non tender, + BS, do not palpate liver spleen or masses Ext:no lower ext edema, 2+ pedal pulses, 2+ radial pulses Neuro:alert and oriented, MAE, follows commands, + facial symmetry    Assessment/Plan Principal Problem:   Chest pain, atypical Active Problems:   CAD (coronary artery disease), non obstructive with cath 2013 and 11/03/13   HYPERTENSION   Hx of Graves' disease   Mediastinal mass, with resection with thymic hyperplasia     Tachycardia   PLAN:  Pt seen and examined by Dr. Mare Ferrari.  This is not felt to be cardiac pain and tachycardia associated with pain.  Continue home meds.  Would recommend triad hospitalist admit to control pain.  BTDVVO,HYWVP R  Nurse  Practitioner Certified [place] Pager [phone] or after 5pm or weekends call [phone] 2013/12/01, 12:53  PM  Patient seen in ER. He tells me he has had this type of pain for "three years". It is in the lower substernal area, builds to a "crescendo" and then releases. Denies any pleuritic component. Not having any pain in the area of the upper chest incision. EKG reviewed by me shows no ischemic changes. Recent cardiac cath showed no obstructive disease. Auscultation reveals no pericardial rub. This is felt to be noncardiac chest pain. He has prominent bowel sounds in upper epigastrium. Would consider GI consult for possible GI spasm etiology of his pain.Agree with assessment of Cecilie Kicks NP-C above.

## 2013-12-17 NOTE — ED Notes (Signed)
Pt had recent surgery for mediastinal mass, having difficulty getting pain under control with his pain meds prescribed. Reports mid chest pain that radiates into his back and having sob. ekg done on arrival.

## 2013-12-17 NOTE — ED Provider Notes (Signed)
See prior note   Janice Norrie, MD 11/21/2013 [phone]

## 2013-12-17 NOTE — ED Provider Notes (Signed)
Medical screening examination/treatment/procedure(s) were conducted as a shared visit with non-physician practitioner(s) and myself.  I personally evaluated the patient during the encounter.  EKG Interpretation    Date/Time:  December 16, 2013 08:23:45 EST Ventricular Rate:  122 PR Interval:  174 QRS Duration: 102 QT Interval:  304 QTC Calculation: 433 R Axis:   59 Text Interpretation:  Sinus tachycardia Possible Left atrial enlargement Incomplete right bundle branch block Left ventricular hypertrophy Since last tracing ST less depressed in Lateral leads Confirmed by Fries  MD-I, Tim Corriher ([id number]) on 12/16/2013 8:48:36 AM            Rolland Porter, MD, Alanson Aly, MD 2013-12-16 [phone]

## 2013-12-17 NOTE — ED Notes (Signed)
Dr. Tomi Bamberger attempting to intubate patient. Unsuccessful.

## 2013-12-17 NOTE — ED Provider Notes (Addendum)
CSN: [id number]     Arrival date & time 12-21-2013  0819 History   First MD Initiated Contact with Patient 12/21/2013 [phone]     Chief Complaint  Patient presents with  . Chest Pain  . Back Pain  . Shortness of Breath   (Consider location/radiation/quality/duration/timing/severity/associated sxs/prior Treatment) HPI Comments: Patient presents to the emergency department with chief complaint of chest pain, back pain, shortness of breath. Patient recently underwent median sternotomy because of a mass was found on 1/22. Patient states that he has had severe pain since the surgery. He states that he had a followup appointment with his cardiothoracic surgeon today, but was unable to make it secondary to pain. Its that he has felt out of breath. He denies any fevers or chills. He states that he has been taking his pain medicine as prescribed. He had a recent heart catheterization which showed mild to moderate nonobstructive CAD.  The history is provided by the patient. No language interpreter was used.    Past Medical History  Diagnosis Date  . Hypertension   . Chronic systolic CHF (congestive heart failure), NYHA class 1   . Tobacco abuse   . COPD (chronic obstructive pulmonary disease)   . GERD (gastroesophageal reflux disease)   . Alcohol abuse, in remission   . Hyperthyroidism   . CAD (coronary artery disease)   . Pericarditis   . Pneumonia   . Hx of Graves' disease 11/03/2013   Past Surgical History  Procedure Laterality Date  . Tonsillectomy    . Esophagogastroduodenoscopy  08/26/2012    Procedure: ESOPHAGOGASTRODUODENOSCOPY (EGD);  Surgeon: Inda Castle, MD;  Location: [place];  Service: Endoscopy;  Laterality: N/A;  . Appendectomy  1976  . Mediasternotomy N/A 11/13/2013    Procedure: MEDIAN STERNOTOMY;  Surgeon: Gaye Pollack, MD;  Location: [facility] OR;  Service: Thoracic;  Laterality: N/A;   Family History  Problem Relation Age of Onset  . Hypertension Father   . Cancer - Other  Father     SCCa of Oropharynx  . Kidney disease Father   . Alcohol abuse Father   . Kidney disease Brother   . Heart disease Maternal Aunt   . Heart disease Paternal Aunt   . Heart disease Maternal Grandfather   . Cancer Neg Hx   . Diabetes Neg Hx   . Early death Neg Hx   . Hyperlipidemia Neg Hx   . Stroke Neg Hx    History  Substance Use Topics  . Smoking status: Current Every Day Smoker    Types: Cigarettes  . Smokeless tobacco: Never Used     Comment: pt has stopped smoking for a few days, uses nicorette gum  . Alcohol Use: No    Review of Systems  All other systems reviewed and are negative.    Allergies  Review of patient's allergies indicates no known allergies.  Home Medications   Current Outpatient Rx  Name  Route  Sig  Dispense  Refill  . aspirin 325 MG tablet   Oral   Take 325 mg by mouth daily.         Marland Kitchen atorvastatin (LIPITOR) 40 MG tablet   Oral   Take 40 mg by mouth daily.         . isosorbide mononitrate (IMDUR) 30 MG 24 hr tablet   Oral   Take 30 mg by mouth daily.         Marland Kitchen losartan-hydrochlorothiazide (HYZAAR) 100-25 MG per tablet   Oral  Take 1 tablet by mouth daily.         . metoprolol (LOPRESSOR) 50 MG tablet   Oral   Take 50 mg by mouth 2 (two) times daily.         . nitroGLYCERIN (NITROSTAT) 0.4 MG SL tablet   Sublingual   Place 0.4 mg under the tongue every 5 (five) minutes as needed for chest pain.         Marland Kitchen oxyCODONE-acetaminophen (PERCOCET/ROXICET) 5-325 MG per tablet   Oral   Take 1-2 tablets by mouth every 6 (six) hours as needed for severe pain.   30 tablet   0   . pantoprazole (PROTONIX) 40 MG tablet   Oral   Take 1 tablet (40 mg total) by mouth 2 (two) times daily before a meal.   60 tablet   6    BP 123/64  Pulse 120  Temp(Src) 97.7 F (36.5 C) (Oral)  Resp 15  SpO2 97% Physical Exam  Nursing note and vitals reviewed. Constitutional: He is oriented to person, place, and time. He appears  well-developed and well-nourished.  HENT:  Head: Normocephalic and atraumatic.  Eyes: Conjunctivae and EOM are normal. Pupils are equal, round, and reactive to light. Right eye exhibits no discharge. Left eye exhibits no discharge. No scleral icterus.  Neck: Normal range of motion. Neck supple. No JVD present.  Cardiovascular: Regular rhythm and normal heart sounds.  Exam reveals no gallop and no friction rub.   No murmur heard. Tachycardic  Pulmonary/Chest: Effort normal and breath sounds normal. No respiratory distress. He has no wheezes. He has no rales. He exhibits tenderness.  Midline, well-healing incision  Abdominal: Soft. He exhibits no distension and no mass. There is no tenderness. There is no rebound and no guarding.  Musculoskeletal: Normal range of motion. He exhibits no edema and no tenderness.  No peripheral edema  Neurological: He is alert and oriented to person, place, and time.  Skin: Skin is warm and dry.  Psychiatric: He has a normal mood and affect. His behavior is normal. Judgment and thought content normal.    ED Course  Procedures (including critical care time) Results for orders placed during the hospital encounter of 12/01/2013  CBC      Result Value Range   WBC 5.1  4.0 - 10.5 K/uL   RBC 3.96 (*) 4.22 - 5.81 MIL/uL   Hemoglobin 11.5 (*) 13.0 - 17.0 g/dL   HCT 33.3 (*) 39.0 - 52.0 %   MCV 84.1  78.0 - 100.0 fL   MCH 29.0  26.0 - 34.0 pg   MCHC 34.5  30.0 - 36.0 g/dL   RDW 12.6  11.5 - 15.5 %   Platelets 252  150 - 400 K/uL  BASIC METABOLIC PANEL      Result Value Range   Sodium 140  137 - 147 mEq/L   Potassium 4.3  3.7 - 5.3 mEq/L   Chloride 99  96 - 112 mEq/L   CO2 24  19 - 32 mEq/L   Glucose, Bld 126 (*) 70 - 99 mg/dL   BUN 54 (*) 6 - 23 mg/dL   Creatinine, Ser 1.38 (*) 0.50 - 1.35 mg/dL   Calcium 10.8 (*) 8.4 - 10.5 mg/dL   GFR calc non Af Amer 57 (*) >90 mL/min   GFR calc Af Amer 66 (*) >90 mL/min  PRO B NATRIURETIC PEPTIDE      Result Value  Range   Pro B Natriuretic peptide (BNP) 355.6 (*)  0 - 125 pg/mL  URINE RAPID DRUG SCREEN (HOSP PERFORMED)      Result Value Range   Opiates POSITIVE (*) NONE DETECTED   Cocaine NONE DETECTED  NONE DETECTED   Benzodiazepines NONE DETECTED  NONE DETECTED   Amphetamines NONE DETECTED  NONE DETECTED   Tetrahydrocannabinol NONE DETECTED  NONE DETECTED   Barbiturates NONE DETECTED  NONE DETECTED  POCT I-STAT TROPONIN I      Result Value Range   Troponin i, poc 0.02  0.00 - 0.08 ng/mL   Comment 3            Dg Chest 2 View  11/15/2013   CLINICAL DATA:  Partial sternotomy and resection of anterior mediastinal mass.  EXAM: CHEST  2 VIEW  COMPARISON:  11/14/2013  FINDINGS: Right jugular central venous catheter tip in the lower SVC is unchanged. No pneumothorax.  Mild left lower lobe atelectasis, slightly improved. Negative for edema. Possible small left effusion. Right lung is clear.  IMPRESSION: Improved aeration in the left lower lobe. There remains a small amount of left lower lobe atelectasis and a small left effusion.   Electronically Signed   By: Franchot Gallo M.D.   On: 11/15/2013 08:36   Dg Chest 2 View  11/09/2013   CLINICAL DATA:  Chest pain  EXAM: CHEST  2 VIEW  COMPARISON:  11/03/2013  FINDINGS: Cardiomediastinal silhouette is stable. No acute infiltrate or pleural effusion. No pulmonary edema. Bony thorax is unremarkable.  IMPRESSION: No active cardiopulmonary disease.   Electronically Signed   By: Lahoma Crocker M.D.   On: 11/09/2013 14:33   Dg Chest 2 View  11/03/2013   CLINICAL DATA:  Chest pain for 2 weeks, history hypertension, COPD, coronary artery disease post MI, smoking  EXAM: CHEST  2 VIEW  COMPARISON:  08/24/2012  FINDINGS: Normal heart size, mediastinal contours, and pulmonary vascularity.  Lungs clear.  No pleural effusion or pneumothorax.  Bones unremarkable.  IMPRESSION: No acute abnormalities.   Electronically Signed   By: Lavonia Dana M.D.   On: 11/03/2013 12:45   Ct Angio  Chest W/cm &/or Wo Cm  21-Dec-2013   CLINICAL DATA:  Shortness of breath and chest pain  EXAM: CT ANGIOGRAPHY CHEST WITH CONTRAST  TECHNIQUE: Multidetector CT imaging of the chest was performed using the standard protocol during bolus administration of intravenous contrast. Multiplanar CT image reconstructions including MIPs were obtained to evaluate the vascular anatomy.  CONTRAST:  35mL OMNIPAQUE IOHEXOL 350 MG/ML SOLN  COMPARISON:  Chest CT angiogram November 09, 2013 and chest radiograph 2013/12/21  FINDINGS: There is no demonstrable pulmonary embolus. There is no thoracic aortic aneurysm or dissection.  Lungs are clear.  No edema or consolidation.  In the interval since the prior study, the patient has had removal of a mass from the anterior mediastinum. There is some nonspecific increased attenuation in the postoperative area, felt to be due to recent surgery. A small amount of air is noted in the right pectoralis major muscle, probably due to postoperative change. There is no appreciable thoracic adenopathy. The pericardium is not thickened.  Small hiatal hernia is again noted.  In the visualized upper abdomen, there is incomplete visualization of a 1 cm nodular area in the right lobe of the liver posteriorly. The adrenals are not entirely visualize currently.  There are no blastic or lytic bone lesions.  Incomplete visualization of the thyroid shows thyroid enlargement with inhomogeneity of the attenuation.  Review of the MIP images  confirms the above findings.  IMPRESSION: No demonstrable pulmonary embolus.  Lungs clear.  Postoperative change in the anterior mediastinum. No mass or adenopathy appreciable. Given the degree of postoperative change in the anterior mediastinum, some residual mass could be obscured at this time. In this regard, followup chest CT in approximately 3 months with particular attention of the anterior mediastinum may be warranted.  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 This finding may warrant nonemergent thyroid ultrasound to further evaluate.   Electronically Signed   By: Lowella Grip M.D.   On: 11/24/2013 10:37   Ct Angio Chest Pe W/cm &/or Wo Cm  11/09/2013   CLINICAL DATA:  Chest pain for 3 weeks  EXAM: CT ANGIOGRAPHY CHEST WITH CONTRAST  TECHNIQUE: Multidetector CT imaging of the chest was performed using the standard protocol during bolus administration of intravenous contrast. Multiplanar CT image reconstructions including MIPs were obtained to evaluate the vascular anatomy.  CONTRAST:  132mL OMNIPAQUE IOHEXOL 350 MG/ML SOLN  COMPARISON:  Chest x-ray November 09, 2013, chest CT October 29, 2009  FINDINGS: There is no pulmonary embolus. The aorta is normal. There is soft tissue in the anterior mediastinum increased compared to prior exam measuring 4.2 x 2.2 cm. There is no mediastinal or hilar lymphadenopathy. The heart size is normal. There is no focal pneumonia, pleural effusion or pulmonary mass. Focal scar is identified in the right apex unchanged. The previously that noted nodule in the lateral right upper lobe is not seen today. There is chronic mild patchy opacity in the right perihilar region unchanged. There is a stable 2.1 cm nodule in the left adrenal gland unchanged. There is a small hiatal hernia. There is a 1.1 cm low density lesion in the right lobe liver unchanged compared to prior exam. The other visualized upper abdominal structures are unremarkable.  Review of the MIP images confirms the above findings.  IMPRESSION: No pulmonary embolus.  No acute abnormality within the lungs.  Abnormal soft tissue in the anterior mediastinum measuring 4.2 x 2.2 cm increase compared to prior exam, neoplasm not excluded.   Electronically Signed   By: Abelardo Diesel M.D.   On: 11/09/2013 16:52   Dg Chest Portable 1 View  11/20/2013   CLINICAL DATA:  Chest and back pain, shortness of breath, history hypertension, smoking, COPD, coronary artery disease,  chronic systolic CHF  EXAM: PORTABLE CHEST - 1 VIEW  COMPARISON:  Portable exam 0842 hr compared to 11/15/2013  FINDINGS: Normal heart size post median sternotomy.  Mediastinal contours and pulmonary vascularity normal.  Lungs clear.  No pleural effusion or pneumothorax.  Bones unremarkable.  IMPRESSION: No acute abnormalities.   Electronically Signed   By: Lavonia Dana M.D.   On: 12/05/2013 08:53   Dg Chest Port 1 View  11/14/2013   CLINICAL DATA:  Status post resection of mediastinal mass.  EXAM: PORTABLE CHEST - 1 VIEW  COMPARISON:  DG CHEST 1V PORT dated 11/13/2013; CT ANGIO CHEST W/CM &/OR WO/CM dated 11/09/2013  FINDINGS: Right jugular central line shows stable positioning. Mediastinal drain remains in place. No evidence of pneumothorax or pneumomediastinum. There is no evidence of pulmonary edema, consolidation, pneumothorax, nodule or pleural fluid. Mild atelectasis of both lung bases. The heart size is normal.  IMPRESSION: Bibasilar atelectasis.  No pneumothorax identified postoperatively.   Electronically Signed   By: Aletta Edouard M.D.   On: 11/14/2013 08:20   Dg Chest Portable 1 View  11/13/2013   CLINICAL DATA:  Prior chest surgery.  EXAM: PORTABLE CHEST - 1 VIEW  COMPARISON:  CT ANGIO CHEST W/CM &/OR WO/CM dated 11/09/2013; DG CHEST 2 VIEW dated 11/09/2013  FINDINGS: Right IJ tube in good anatomic position. Mediastinal drainage catheter is noted. Prior median sternotomy. Mild cardiomegaly, pulmonary vascularity is normal. No focal infiltrate.  IMPRESSION: 1. Right IJ line and mediastinal drains catheter noted. Prior median sternotomy. 2. Mild cardiomegaly.  No CHF.   Electronically Signed   By: Marcello Moores  Register   On: 11/13/2013 20:44      EKG Interpretation    Date/Time:  12/22/2013 08:23:45 EST Ventricular Rate:  122 PR Interval:  174 QRS Duration: 102 QT Interval:  304 QTC Calculation: 433 R Axis:   59 Text Interpretation:  Sinus tachycardia Possible Left atrial  enlargement Incomplete right bundle branch block Left ventricular hypertrophy Since last tracing ST less depressed in Lateral leads Confirmed by KNAPP  MD-I, IVA ([id number]) on December 22, 2013 8:48:36 AM           EKG Interpretation    Date/Time:  12/22/13 08:23:45 EST Ventricular Rate:  127 PR Interval:  143 QRS Duration: 99 QT Interval:  304 QTC Calculation: 442 R Axis:   59 Text Interpretation:  Sinus tachycardia Possible Left atrial enlargement Incomplete right bundle branch block Left ventricular hypertrophy Since last tracing ST less depressed in Lateral leads.  Unchanged from previous. Reviewed by Dr. Tomi Bamberger, unable to input into Muse.             SUTURE REMOVAL Performed by: Montine Circle  Consent: Verbal consent obtained. Consent given by: patient Required items: required blood products, implants, devices, and special equipment available Time out: Immediately prior to procedure a "time out" was called to verify the correct patient, procedure, equipment, support staff and site/side marked as required.  Location: Right anterior chest wall  Wound Appearance: clean  Sutures/Staples Removed: 1 suture   Patient tolerance: Patient tolerated the procedure well with no immediate complications.     MDM   1. Chest pain, atypical   2. Chest pain     Prior notes have been reviewed.  Patient discussed with Dr. Tomi Bamberger.   In reviewing the prior notes, the tachycardia is believed to be from thyroid dysfunction.  Patient has never had a PE.  He is oxygenating well.  Patient states that pain has been constant since his surgery.  The SOB is new, but could be related to COPD, as he has some wheezing on exam.  Will check labs and re-evaluate.   9:43 AM Patient states that he started having worsening pain and is crying out at the bedside.  Immediately seen by myself and Dr. Tomi Bamberger.  Patient given 4mg  of morphine with some relief.  He has some intermittent spikes in  HR up to the 140s.  Will give 1mg  dilaudid, 60mg  lasix, 1mg  ativan, and 5mg  lopressor per Dr. Johnsie Kindred recommendations.  Patient will also need to have an additional CT scan.  Will control pain and move to CT. Slight bump in creatinine, now 1.38, however GFR is 66. Patient can tolerate contrast.  11:56 AM Discussed the patient with Dr. Cyndia Bent, who tells me that the recent surgery was not to alleviate the patient's chest pain, and he tells me that he specifically told the patient that this would not correct the chest pain. Surgically, the patient is doing well. Patient had told me that he had a followup appointment with Dr. Cyndia Bent today, when in actuality it was just with  the nurse to have the chest tube suture removed. I will contact cardiology for further evaluation, as the patient has had shortness of breath and diaphoresis. CT angio is negative. Additionally, Dr. Cyndia Bent asks if I could remove the suture, the patient missed his appointment.  2:06 PM Patient discussed with triad hospitalist, who will admit the patient for pain control and observation.  Cardiac cath results are below: Cardiac Cath Procedure Note:  Indication: NSTEMI  Procedures performed:  1) Selective coronary angiography  2) Left heart catheterization  3) Left ventriculogram  Description of procedure:   The risks and indication of the procedure were explained. Consent was signed and placed on the chart. An appropriate timeout was taken prior to the procedure. After a normal Allen's test was confirmed, the right wrist was prepped and draped in the routine sterile fashion and anesthetized with 1% local lidocaine.  A 5 FR arterial sheath was then placed in the right radial artery using a modified Seldinger technique. Systemic heparin was administered. 3mg  IV verapamil was given through the sheath. Standard catheters including a JL 3.5, JR4 and straight pigtail were used. All catheter exchanges were made over a wire.  Complications:  None apparent  Findings:  Ao Pressure: 109/73 (90)  LV Pressure: 117/4/15  There was no signficant gradient across the aortic valve on pullback.  Left main: 20% distal stenosis.  Left Anterior Descending Artery: Large caliber vessel that courses to the apex. The ostium appears to have 20-30% stenosis. The mid vessel has diffuse 20-30% stenosis. There are two small to moderate caliber diagonal branches without focal stenosis.  Circumflex Artery: Large caliber vessel that terminates into a moderate-sized branching obtuse marginal There is mild plaque in the proximal vessel and in the marginal branch. There is a small ramus branch.  Right Coronary Artery: Moderate caliber, dominant vessel with diffuse 40-50% mid stenosis.  Left Ventricular Angiogram: LVEF 65-70%. Nu wall motion abnormalities  Assessment:  1. Mild to moderate non-obstructive CAD  2. Normal LV function  Plan/Discussion:  Suspect CP may pericarditic in nature. Tachycardia may be related to hyperthyroidism (TSH pending). Continue b-blocker. Check UDS, d-dimer and echo.   Montine Circle, PA-C 12/06/2013 1407  3:31 PM Patient decompensated. Resuscitation effort attempted. Dr. Tomi Bamberger ran code, I was present and assisted..  Please see her note regarding the resuscitation attempt.  Montine Circle, PA-C 12/06/13 [place], PA-C December 06, 2013 [phone]

## 2013-12-17 NOTE — ED Notes (Addendum)
Spoke with Alejandro Reyes regarding death of his brother. Answered all questions. Brother requesting autopsy to be performed. Bed Control called and made aware of request. I gave brother bed controls number and informed him of the next steps. I was also given permission to release patients belongings to [name] patients cousin who is at bedside.  Khaleed Holan, brother, [phone]

## 2013-12-17 NOTE — Progress Notes (Signed)
Chaplain paged to ED. No family present. Medical staff working on patient. Chaplain informed ED to call if pastoral care assistance needed when family arrives.   12/11/2013 1500  Clinical Encounter Type  Visited With Patient not available;Health care provider  Visit Type Initial;Death;ED  Referral From Nurse

## 2013-12-17 DEATH — deceased

## 2013-12-28 ENCOUNTER — Telehealth (HOSPITAL_COMMUNITY): Payer: Self-pay

## 2013-12-28 NOTE — ED Notes (Signed)
[employer] [place] calling stating having trouble getting death certificate signed.  Post Mortem charting and paper charting in Pt Placement reviewed and [employer] notified pt went to [place] for private autopsy 11/19/2013 so [facility] MD will be signing death certificate.

## 2014-01-02 ENCOUNTER — Telehealth: Payer: Self-pay

## 2014-01-02 NOTE — Telephone Encounter (Signed)
Arlys John Called From [place] Dept asking about D/C that [place] Was supposed to Sign, I let her  Know He would Not Sign D/C according to Our Phone Notes, I also Let her Know We have spoke with Levada Dy on Several occasions At [facility] and she was Left with a Message To Follow Up With Dr.Bartle/TCTS to try and Get D/C Signed Maxie Better all the above and I let Her know If iIcan Help her in any other Way to Please call me back.

## 2014-01-03 ENCOUNTER — Telehealth (HOSPITAL_COMMUNITY): Payer: Self-pay

## 2014-01-03 NOTE — ED Notes (Addendum)
Call from [facility] w/[employer]  PCP Scarlette Calico noted in post mortem check list to be signing death certificate refuses to sign .  Chart reviewed and per Dr Eliane Decree she spoke to PCP and he agreed to sign.  Office of PCP called and PCP now saying he changed his mind and will not sign.  Asked Jonelle Sidle to bring death certificate to ED tomorrow and will ask Dr Stevie Kern to sign as Dr Tomi Bamberger is not back till Monday

## 2014-01-04 ENCOUNTER — Telehealth (HOSPITAL_COMMUNITY): Payer: Self-pay

## 2014-01-04 NOTE — ED Notes (Signed)
Death certificate signed by Dr Riki Altes.  Death certificate given to T. Clarke  w/public records by current Probation officer.

## 2014-01-15 IMAGING — CR DG CHEST 2V
2 series · 2 of 2 positions shown · non-contrast
Comparison: 11/18/2009

CLINICAL DATA: Chest pain.

CHEST - 2 VIEW

[w chest pa]
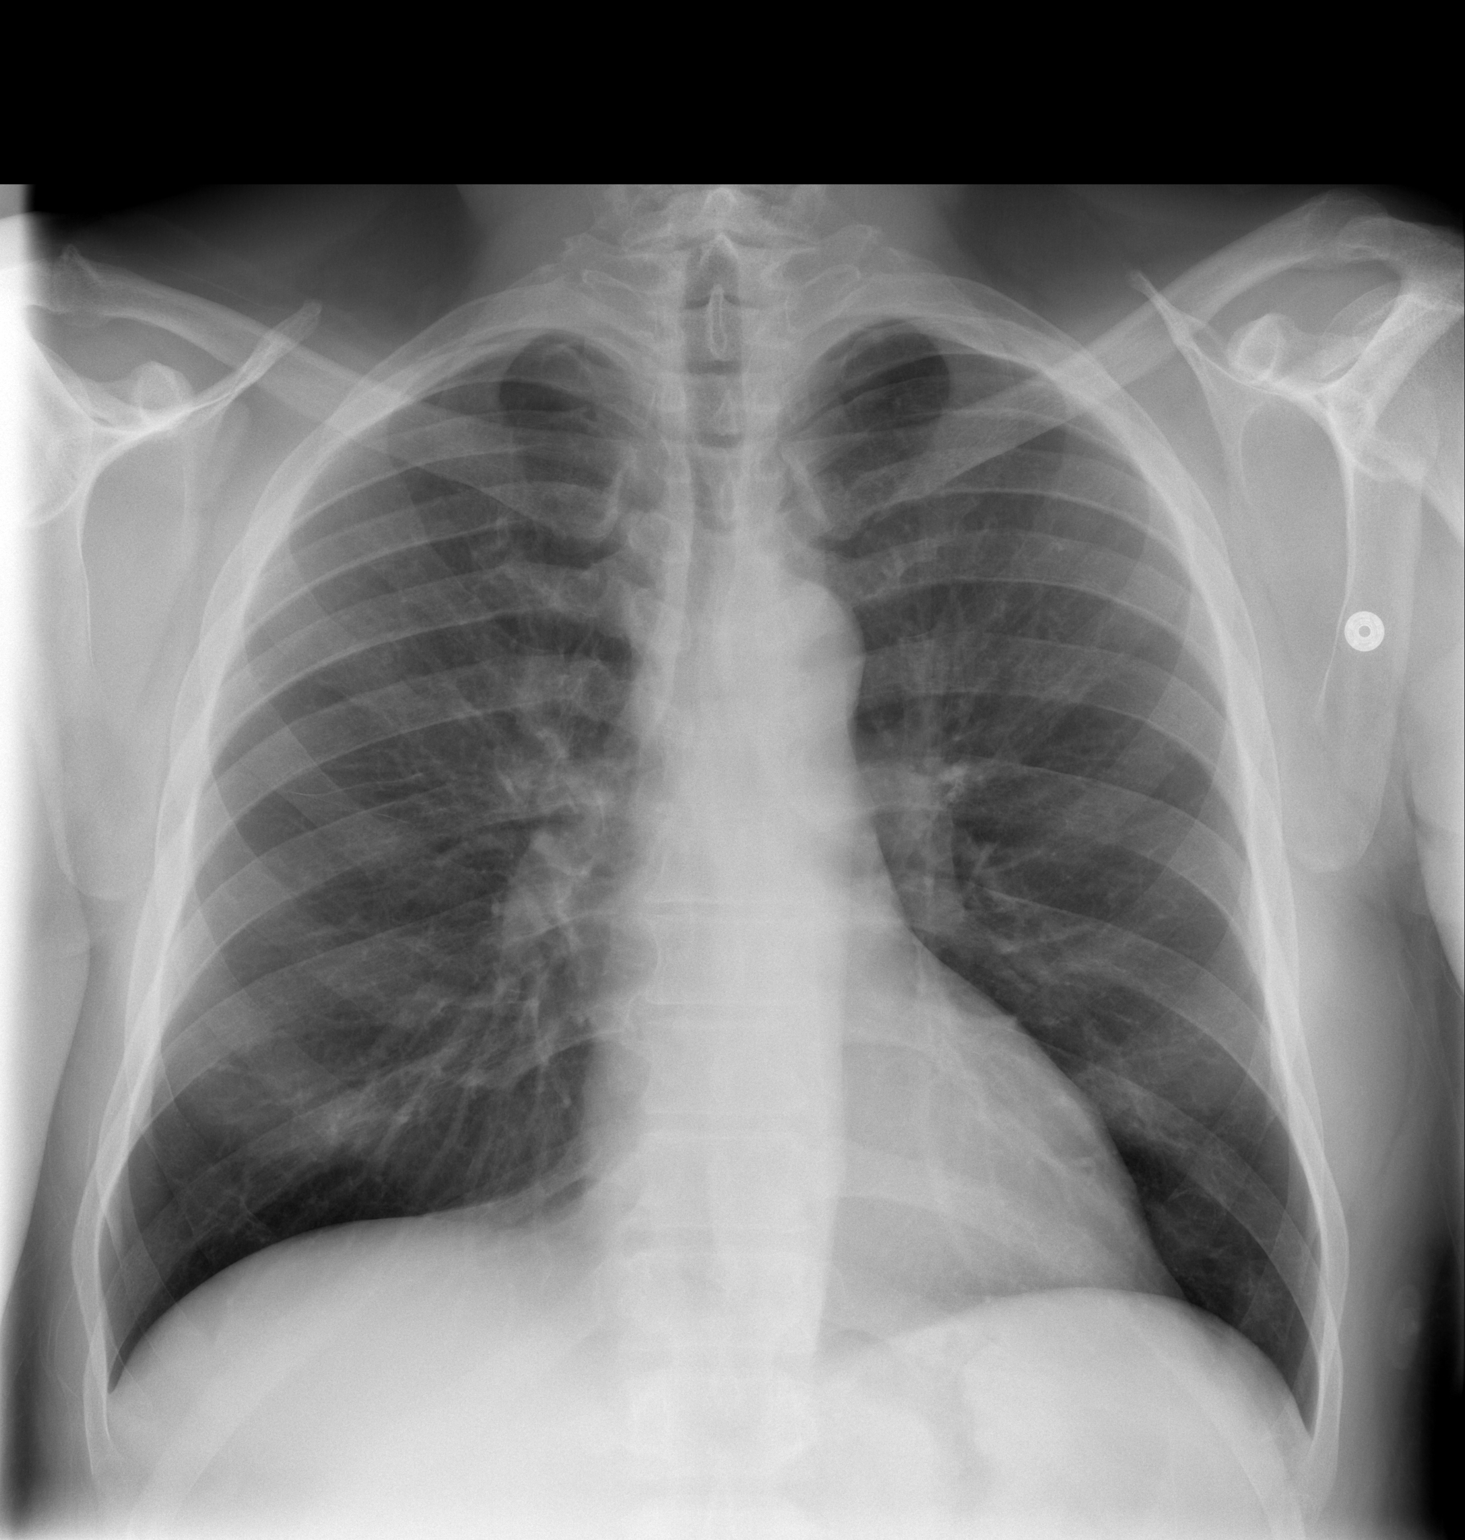

[w chest lat]
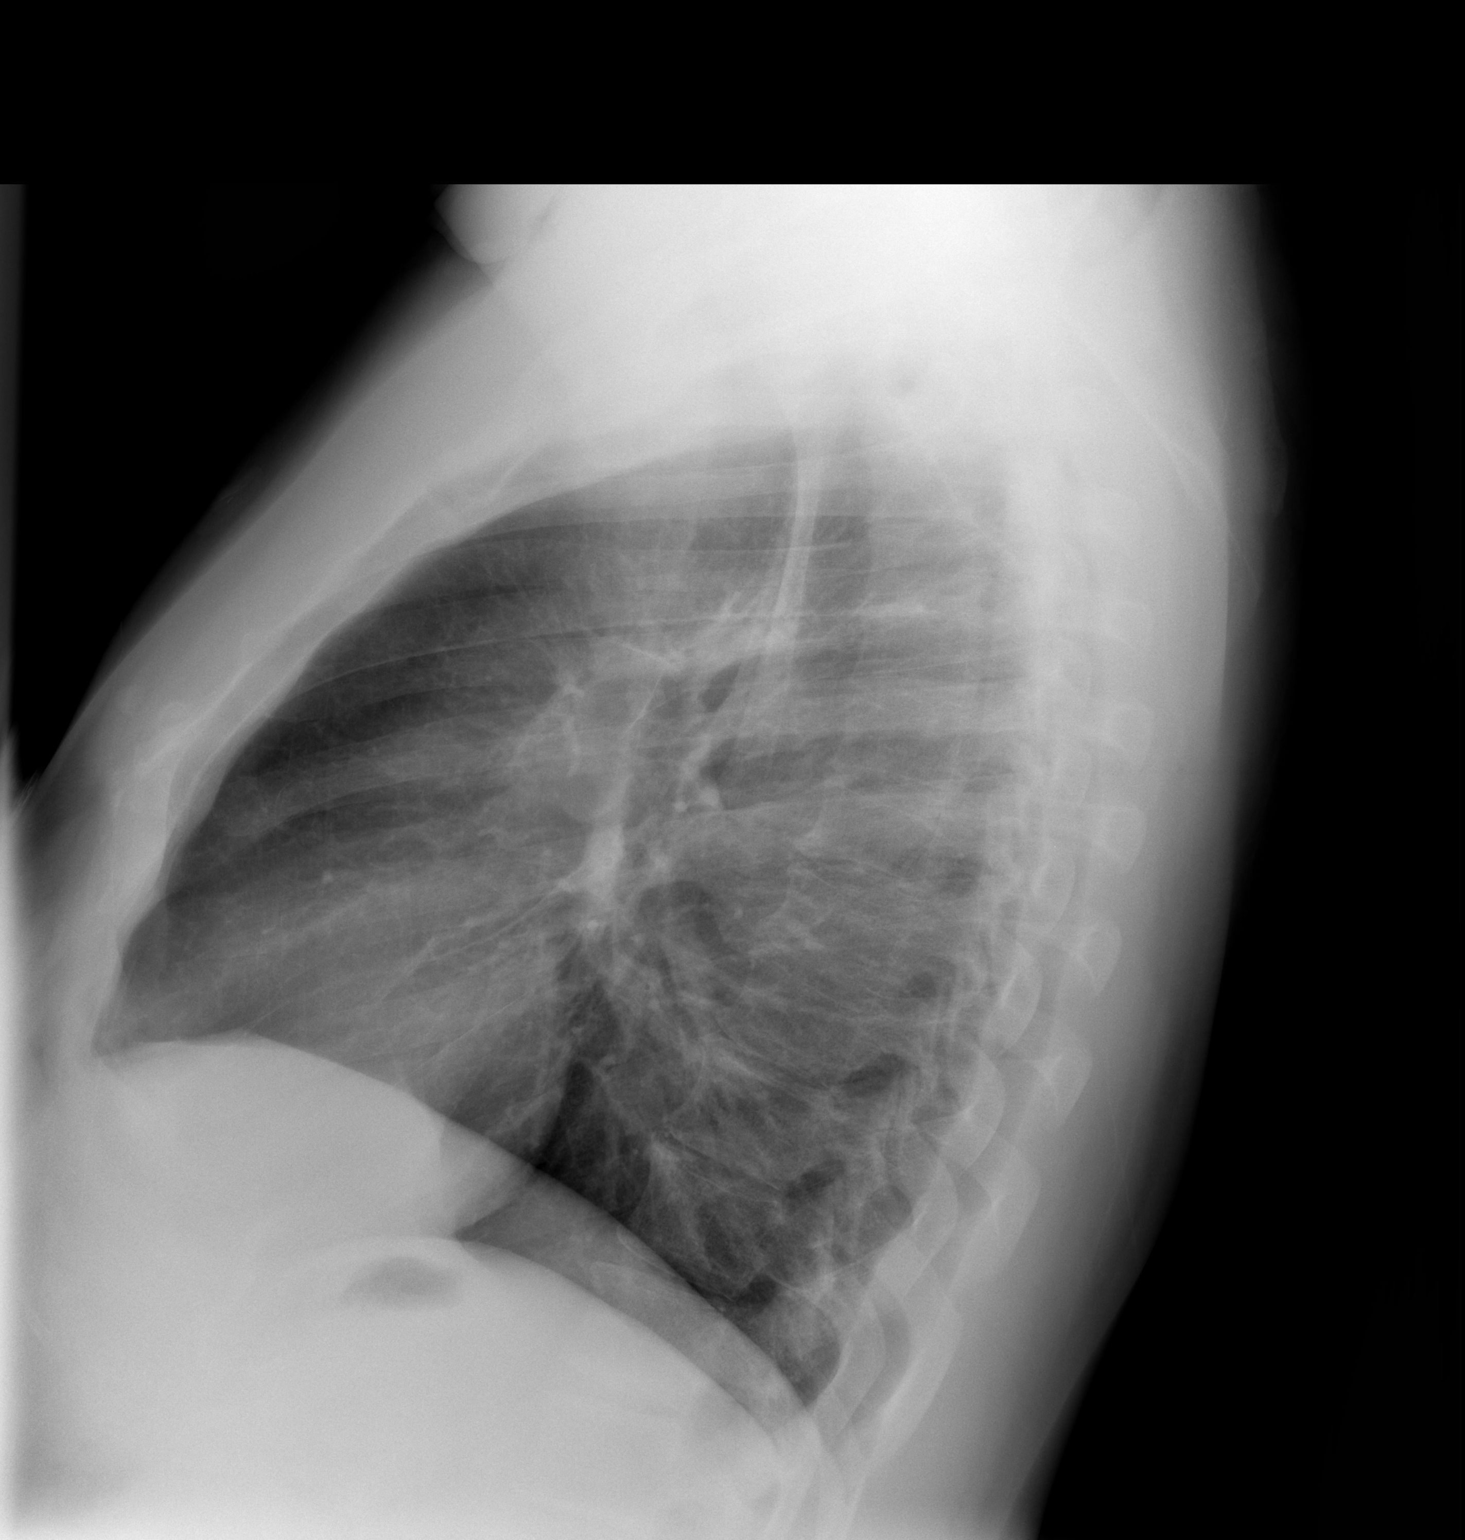

[2 of 2 positions shown; findings below may reference images not displayed]

FINDINGS: Previously seen opacity in the central right upper lobe
is no longer visualized.  No evidence of acute infiltrate or edema.
No evidence of pleural effusion.  Heart size is normal.  There is
no hilar or mediastinal lymphadenopathy identified.
IMPRESSION: No active disease.

## 2014-09-27 ENCOUNTER — Encounter (HOSPITAL_COMMUNITY): Payer: Self-pay | Admitting: Cardiovascular Disease

## 2015-04-22 IMAGING — CT CT ANGIO CHEST
2 of 9 series · 19 of 46 positions shown · IV contrast (APPLIED)
Comparison: Chest x-ray November 09, 2013, chest CT October 29, 2009

CLINICAL DATA: Chest pain for 3 weeks

EXAM:
CT ANGIOGRAPHY CHEST WITH CONTRAST
TECHNIQUE: Multidetector CT imaging of the chest was performed using the
standard protocol during bolus administration of intravenous
contrast. Multiplanar CT image reconstructions including MIPs were
obtained to evaluate the vascular anatomy.
CONTRAST:  100mL OMNIPAQUE IOHEXOL 350 MG/ML SOLN

[Series 5: thins · axial · 0.82mm/px · z∈[-312,-33]mm · 16 of 315 slices shown]
[im 18/315  lung]
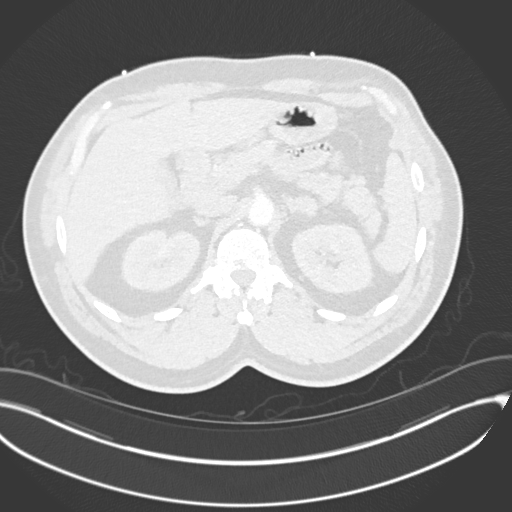
[im 35/315  soft-tissue]
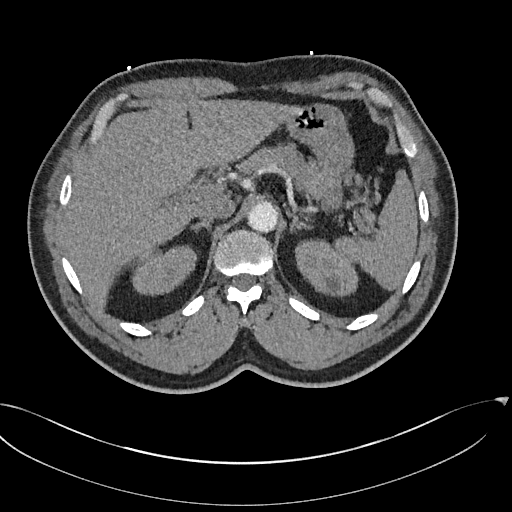
[im 53/315  lung]
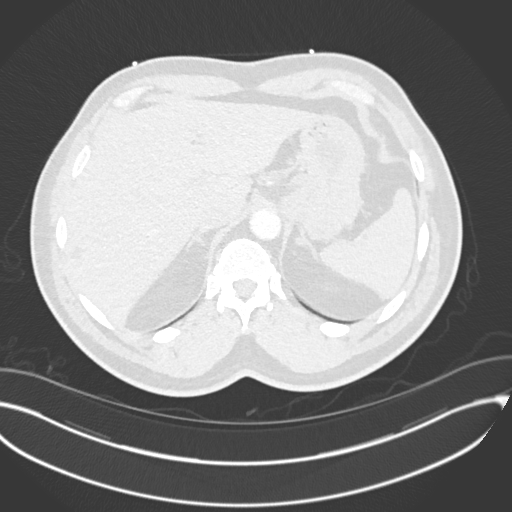
[im 70/315  soft-tissue]
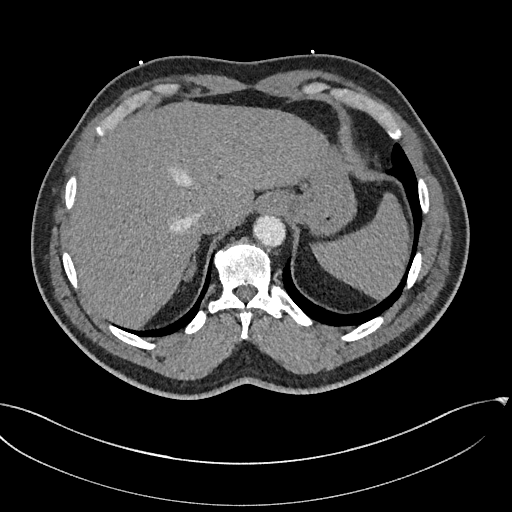
[im 88/315  lung]
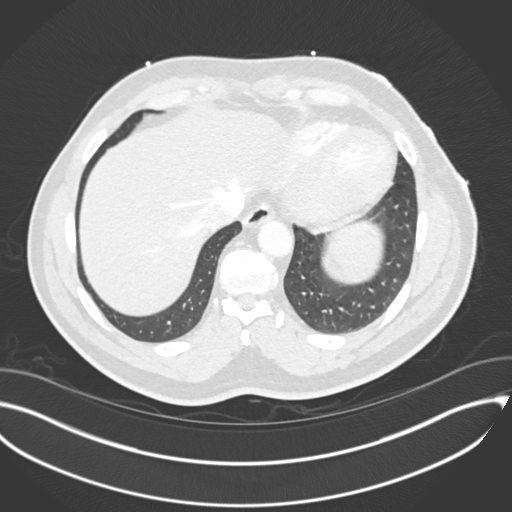
[im 105/315  soft-tissue]
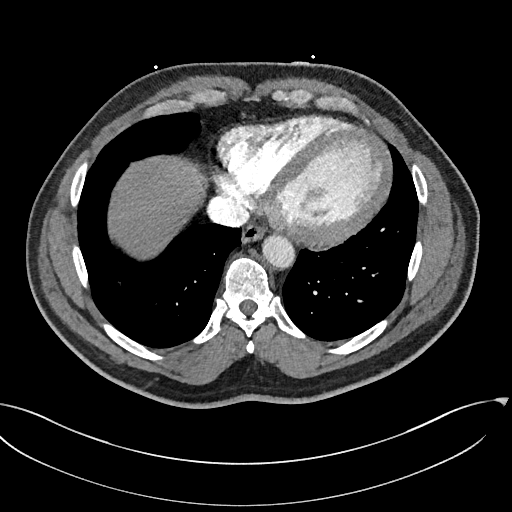
[im 123/315  lung]
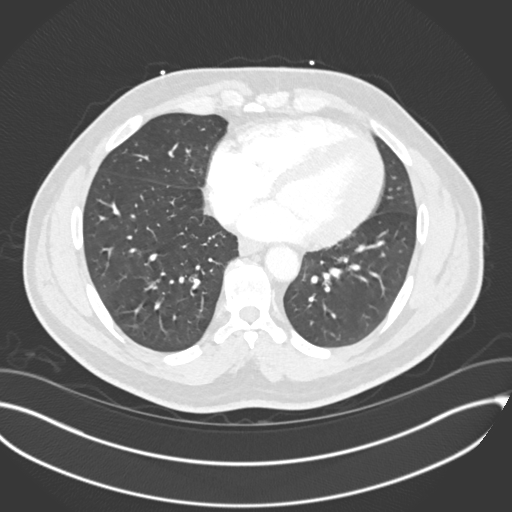
[im 140/315  soft-tissue]
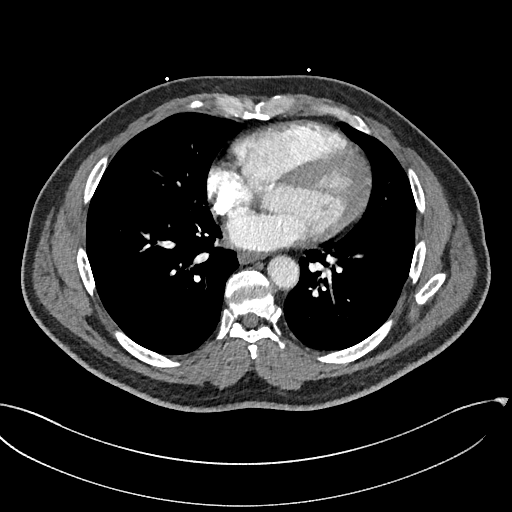
[im 175/315  lung]
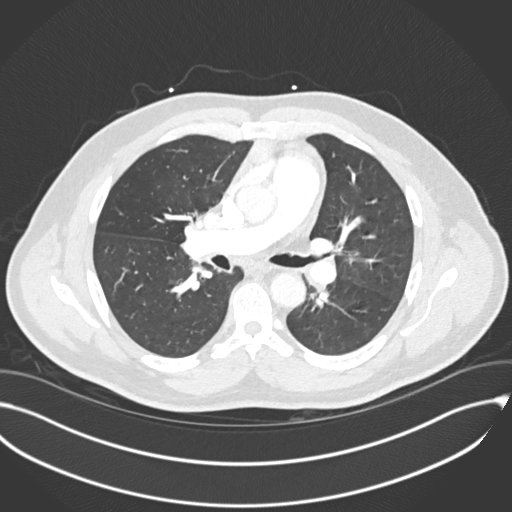
[im 192/315  soft-tissue]
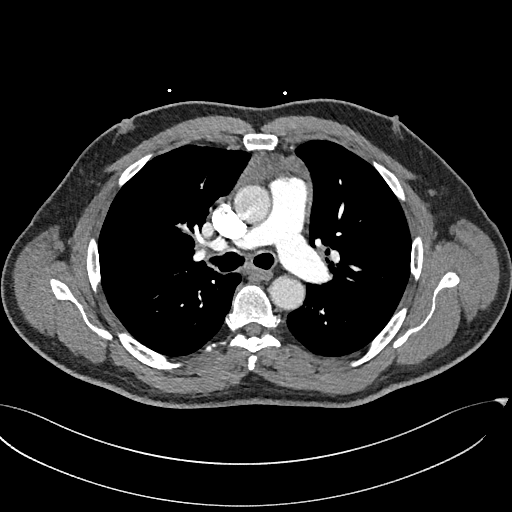
[im 210/315  lung]
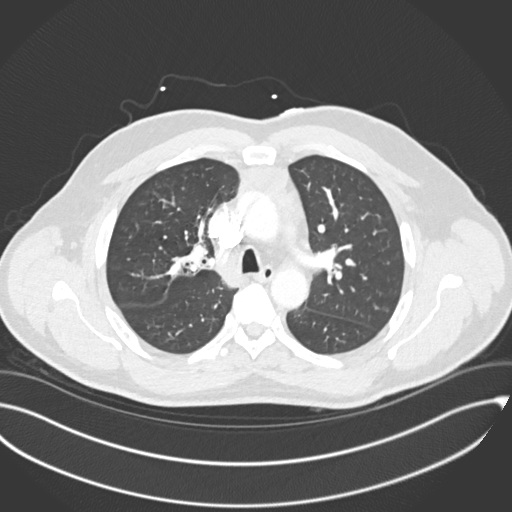
[im 227/315  soft-tissue]
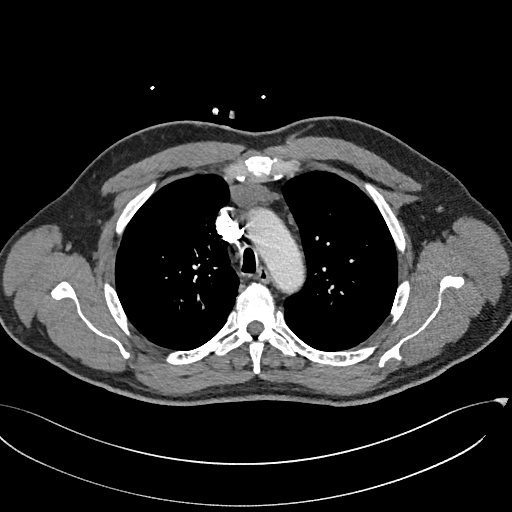
[im 245/315  lung]
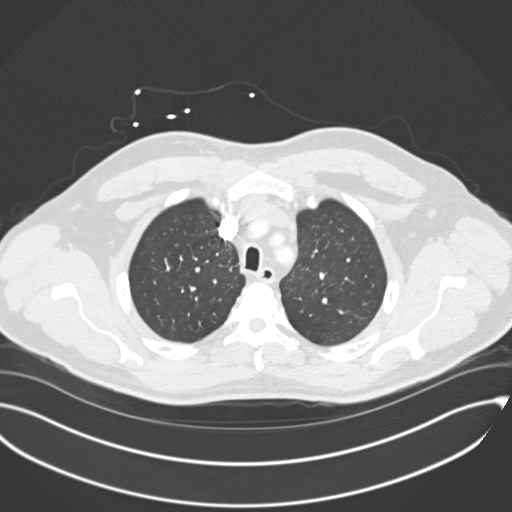
[im 262/315  soft-tissue]
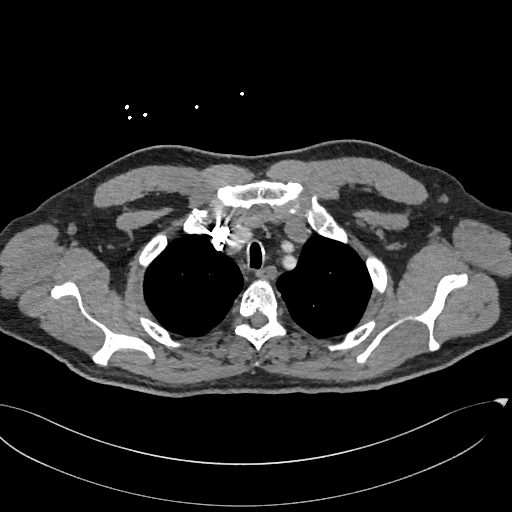
[im 280/315  lung]
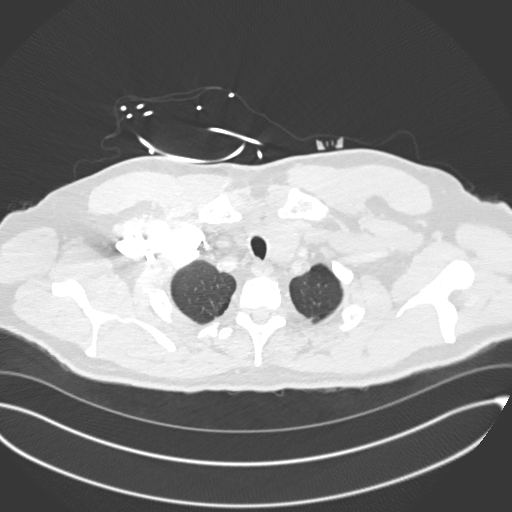
[im 297/315  soft-tissue]
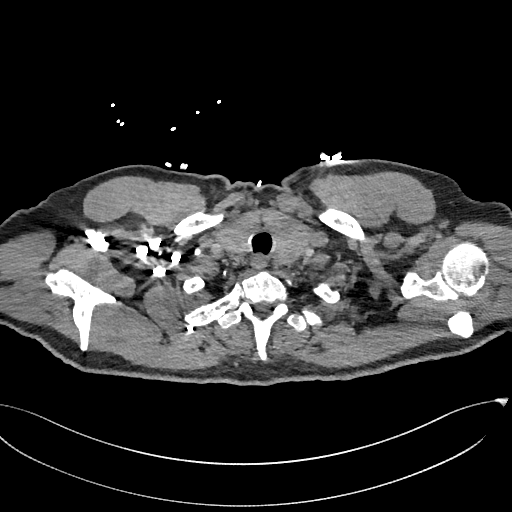

[Series 7: coronal mpr · coronal · 0.62mm/px · 3 of 133 slices shown]
[im 34/133  soft-tissue]
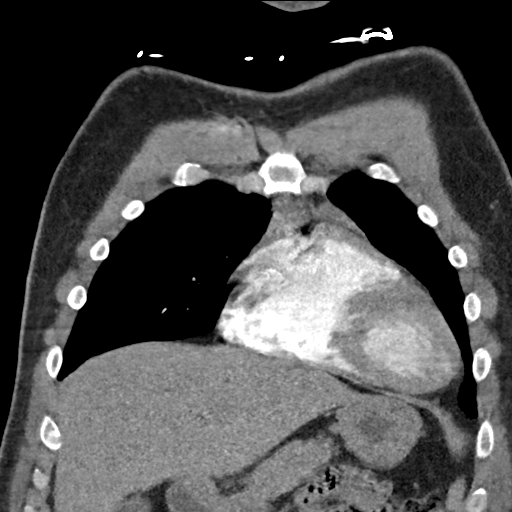
[im 67/133  soft-tissue]
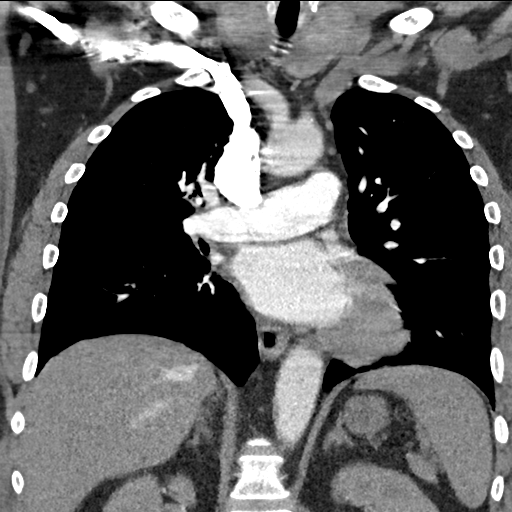
[im 100/133  soft-tissue]
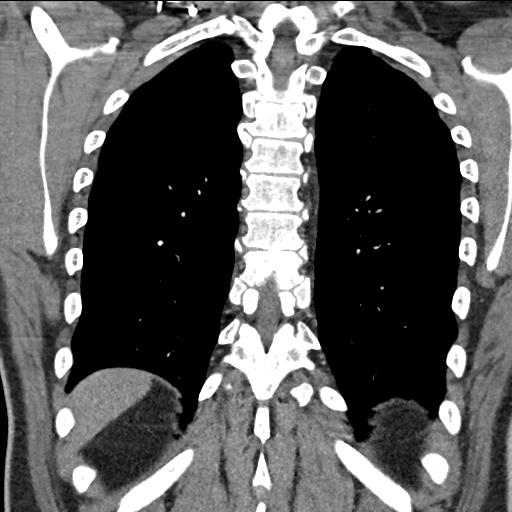

[19 of 46 positions shown; findings below may reference images not displayed]

FINDINGS: There is no pulmonary embolus. The aorta is normal. There is soft
tissue in the anterior mediastinum increased compared to prior exam
measuring 4.2 x 2.2 cm. There is no mediastinal or hilar
lymphadenopathy. The heart size is normal. There is no focal
pneumonia, pleural effusion or pulmonary mass. Focal scar is
identified in the right apex unchanged. The previously that noted
nodule in the lateral right upper lobe is not seen today. There is
chronic mild patchy opacity in the right perihilar region unchanged.
There is a stable 2.1 cm nodule in the left adrenal gland unchanged.
There is a small hiatal hernia. There is a 1.1 cm low density lesion
in the right lobe liver unchanged compared to prior exam. The other
visualized upper abdominal structures are unremarkable.

Review of the MIP images confirms the above findings.
IMPRESSION: No pulmonary embolus.  No acute abnormality within the lungs.

Abnormal soft tissue in the anterior mediastinum measuring 4.2 x
cm increase compared to prior exam, neoplasm not excluded.
